# Patient Record
Sex: Female | Born: 1965 | Race: Black or African American | Hispanic: No | Marital: Married | Smoking: Former smoker
Health system: Southern US, Community
[De-identification: ages and names within clinical notes are randomized; demographics above are authoritative.]

## PROBLEM LIST (undated history)

## (undated) ENCOUNTER — Ambulatory Visit (HOSPITAL_COMMUNITY): Admission: EM | Payer: Medicaid Other

## (undated) DIAGNOSIS — R4020 Unspecified coma: Secondary | ICD-10-CM

## (undated) DIAGNOSIS — I509 Heart failure, unspecified: Secondary | ICD-10-CM

## (undated) DIAGNOSIS — K449 Diaphragmatic hernia without obstruction or gangrene: Secondary | ICD-10-CM

## (undated) DIAGNOSIS — K219 Gastro-esophageal reflux disease without esophagitis: Secondary | ICD-10-CM

## (undated) DIAGNOSIS — H269 Unspecified cataract: Secondary | ICD-10-CM

## (undated) DIAGNOSIS — F329 Major depressive disorder, single episode, unspecified: Secondary | ICD-10-CM

## (undated) DIAGNOSIS — I639 Cerebral infarction, unspecified: Secondary | ICD-10-CM

## (undated) DIAGNOSIS — M1990 Unspecified osteoarthritis, unspecified site: Secondary | ICD-10-CM

## (undated) DIAGNOSIS — F32A Depression, unspecified: Secondary | ICD-10-CM

## (undated) DIAGNOSIS — J45909 Unspecified asthma, uncomplicated: Secondary | ICD-10-CM

## (undated) DIAGNOSIS — IMO0001 Reserved for inherently not codable concepts without codable children: Principal | ICD-10-CM

## (undated) DIAGNOSIS — I10 Essential (primary) hypertension: Secondary | ICD-10-CM

## (undated) DIAGNOSIS — K8020 Calculus of gallbladder without cholecystitis without obstruction: Secondary | ICD-10-CM

## (undated) DIAGNOSIS — G4700 Insomnia, unspecified: Secondary | ICD-10-CM

## (undated) DIAGNOSIS — M797 Fibromyalgia: Secondary | ICD-10-CM

## (undated) DIAGNOSIS — E11319 Type 2 diabetes mellitus with unspecified diabetic retinopathy without macular edema: Secondary | ICD-10-CM

## (undated) HISTORY — DX: Depression, unspecified: F32.A

## (undated) HISTORY — DX: Type 2 diabetes mellitus with unspecified diabetic retinopathy without macular edema: E11.319

## (undated) HISTORY — DX: Unspecified asthma, uncomplicated: J45.909

## (undated) HISTORY — PX: TUBAL LIGATION: SHX77

## (undated) HISTORY — DX: Unspecified cataract: H26.9

## (undated) HISTORY — DX: Reserved for inherently not codable concepts without codable children: IMO0001

## (undated) HISTORY — PX: INNER EAR SURGERY: SHX679

## (undated) HISTORY — DX: Insomnia, unspecified: G47.00

## (undated) HISTORY — DX: Major depressive disorder, single episode, unspecified: F32.9

## (undated) HISTORY — DX: Gastro-esophageal reflux disease without esophagitis: K21.9

---

## 2005-04-20 ENCOUNTER — Emergency Department (HOSPITAL_COMMUNITY): Admission: EM | Admit: 2005-04-20 | Discharge: 2005-04-20 | Payer: Self-pay | Admitting: Family Medicine

## 2005-09-08 DIAGNOSIS — I639 Cerebral infarction, unspecified: Secondary | ICD-10-CM

## 2005-09-08 HISTORY — DX: Cerebral infarction, unspecified: I63.9

## 2008-12-25 ENCOUNTER — Ambulatory Visit: Payer: Self-pay | Admitting: Radiology

## 2008-12-25 ENCOUNTER — Ambulatory Visit (HOSPITAL_BASED_OUTPATIENT_CLINIC_OR_DEPARTMENT_OTHER): Admission: RE | Admit: 2008-12-25 | Discharge: 2008-12-25 | Payer: Self-pay | Admitting: Family Medicine

## 2011-01-14 ENCOUNTER — Inpatient Hospital Stay (INDEPENDENT_AMBULATORY_CARE_PROVIDER_SITE_OTHER)
Admission: RE | Admit: 2011-01-14 | Discharge: 2011-01-14 | Disposition: A | Discharge status: Home / Self Care | Payer: Self-pay | Source: Ambulatory Visit | Attending: Family Medicine | Admitting: Family Medicine

## 2011-01-14 ENCOUNTER — Ambulatory Visit (INDEPENDENT_AMBULATORY_CARE_PROVIDER_SITE_OTHER): Payer: Self-pay

## 2011-01-14 DIAGNOSIS — S8000XA Contusion of unspecified knee, initial encounter: Secondary | ICD-10-CM

## 2011-01-14 DIAGNOSIS — S60219A Contusion of unspecified wrist, initial encounter: Secondary | ICD-10-CM

## 2011-06-02 ENCOUNTER — Inpatient Hospital Stay (INDEPENDENT_AMBULATORY_CARE_PROVIDER_SITE_OTHER)
Admission: RE | Admit: 2011-06-02 | Discharge: 2011-06-02 | Disposition: A | Discharge status: Home / Self Care | Payer: Self-pay | Source: Ambulatory Visit | Attending: Family Medicine | Admitting: Family Medicine

## 2011-06-02 DIAGNOSIS — J45909 Unspecified asthma, uncomplicated: Secondary | ICD-10-CM

## 2011-06-02 DIAGNOSIS — G609 Hereditary and idiopathic neuropathy, unspecified: Secondary | ICD-10-CM

## 2011-06-02 DIAGNOSIS — E119 Type 2 diabetes mellitus without complications: Secondary | ICD-10-CM

## 2011-06-02 LAB — TSH: TSH: 0.942 u[IU]/mL (ref 0.350–4.500)

## 2011-06-02 LAB — POCT I-STAT, CHEM 8
BUN: 5 mg/dL — ABNORMAL LOW (ref 6–23)
Chloride: 102 mEq/L (ref 96–112)
HCT: 43 % (ref 36.0–46.0)
TCO2: 25 mmol/L (ref 0–100)

## 2011-09-29 ENCOUNTER — Emergency Department (HOSPITAL_COMMUNITY)
Admission: EM | Admit: 2011-09-29 | Discharge: 2011-09-29 | Disposition: A | Discharge status: Home / Self Care | Payer: Medicaid Other | Attending: Emergency Medicine | Admitting: Emergency Medicine

## 2011-09-29 ENCOUNTER — Encounter (HOSPITAL_COMMUNITY): Payer: Self-pay

## 2011-09-29 DIAGNOSIS — Z8679 Personal history of other diseases of the circulatory system: Secondary | ICD-10-CM | POA: Insufficient documentation

## 2011-09-29 DIAGNOSIS — R05 Cough: Secondary | ICD-10-CM | POA: Insufficient documentation

## 2011-09-29 DIAGNOSIS — J069 Acute upper respiratory infection, unspecified: Secondary | ICD-10-CM | POA: Insufficient documentation

## 2011-09-29 DIAGNOSIS — E1169 Type 2 diabetes mellitus with other specified complication: Secondary | ICD-10-CM | POA: Insufficient documentation

## 2011-09-29 DIAGNOSIS — J3489 Other specified disorders of nose and nasal sinuses: Secondary | ICD-10-CM | POA: Insufficient documentation

## 2011-09-29 DIAGNOSIS — R059 Cough, unspecified: Secondary | ICD-10-CM | POA: Insufficient documentation

## 2011-09-29 DIAGNOSIS — J029 Acute pharyngitis, unspecified: Secondary | ICD-10-CM | POA: Insufficient documentation

## 2011-09-29 DIAGNOSIS — R3589 Other polyuria: Secondary | ICD-10-CM | POA: Insufficient documentation

## 2011-09-29 DIAGNOSIS — R358 Other polyuria: Secondary | ICD-10-CM | POA: Insufficient documentation

## 2011-09-29 DIAGNOSIS — R739 Hyperglycemia, unspecified: Secondary | ICD-10-CM

## 2011-09-29 HISTORY — DX: Cerebral infarction, unspecified: I63.9

## 2011-09-29 HISTORY — DX: Unspecified coma: R40.20

## 2011-09-29 HISTORY — DX: Calculus of gallbladder without cholecystitis without obstruction: K80.20

## 2011-09-29 HISTORY — DX: Unspecified osteoarthritis, unspecified site: M19.90

## 2011-09-29 HISTORY — DX: Diaphragmatic hernia without obstruction or gangrene: K44.9

## 2011-09-29 LAB — GLUCOSE, CAPILLARY: Glucose-Capillary: 299 mg/dL — ABNORMAL HIGH (ref 70–99)

## 2011-09-29 MED ORDER — METFORMIN HCL 500 MG PO TABS: 500.0000 mg | ORAL_TABLET | Freq: Every day | ORAL | Status: DC

## 2011-09-29 MED ORDER — METFORMIN HCL 500 MG PO TABS
500.0000 mg | ORAL_TABLET | Freq: Once | ORAL | Status: DC
  Filled 2011-09-29: qty 1

## 2011-09-29 MED ORDER — ALBUTEROL SULFATE HFA 108 (90 BASE) MCG/ACT IN AERS
2.0000 | INHALATION_SPRAY | Freq: Four times a day (QID) | RESPIRATORY_TRACT | Status: DC
  Administered 2011-09-29: 2 via RESPIRATORY_TRACT
  Filled 2011-09-29: qty 6.7

## 2011-09-29 NOTE — ED Provider Notes (Signed)
History     CSN: [id number]  Arrival date & time 09/29/11  1013   First MD Initiated Contact with Patient 09/29/11 1034      Chief Complaint  Patient presents with  . Diabetes    (Consider location/radiation/quality/duration/timing/severity/associated sxs/prior treatment) HPI Comments: Patient states she is unable to see her primary care provider in [place] because she owes them money, and has therefore been off of her metformin for several weeks.  States she is sometimes thirsty and has polyuria and has recently had a URI that is improving, but otherwise feels fine.  URI symptoms began last week, included nasal congestion, rhinorrhea, sore throat, cough - states these have all largely improved but she continues to have mild cough.  Denies fevers, SOB, wheezing.  Denies abdominal pain, N/V/D.    The history is provided by the patient.    Past Medical History  Diagnosis Date  . Diabetes mellitus   . Arthritis   . Hiatal hernia   . Gall stone   . Stroke   . Coma     History reviewed. No pertinent past surgical history.  History reviewed. No pertinent family history.  History  Substance Use Topics  . Smoking status: Never Smoker   . Smokeless tobacco: Not on file  . Alcohol Use: No    OB History    Grav Para Term Preterm Abortions TAB SAB Ect Mult Living                  Review of Systems  Constitutional: Positive for fatigue. Negative for fever and chills.  HENT: Negative for sore throat, neck stiffness and sinus pressure.   Respiratory: Positive for cough. Negative for chest tightness, shortness of breath and wheezing.   Gastrointestinal: Negative for nausea, vomiting, abdominal pain and diarrhea.  All other systems reviewed and are negative.    Allergies  Review of patient's allergies indicates no known allergies.  Home Medications  No current outpatient prescriptions on file.  BP 132/88  Pulse 98  Temp(Src) 98.6 F (37 C) (Oral)  Resp 20  SpO2  99%  LMP 09/09/2011  Physical Exam  Nursing note and vitals reviewed. Constitutional: She is oriented to person, place, and time. She appears well-developed and well-nourished.  HENT:  Head: Normocephalic and atraumatic.  Nose: No mucosal edema. Right sinus exhibits no maxillary sinus tenderness and no frontal sinus tenderness. Left sinus exhibits no maxillary sinus tenderness and no frontal sinus tenderness.  Mouth/Throat: Uvula is midline, oropharynx is clear and moist and mucous membranes are normal. No oropharyngeal exudate, posterior oropharyngeal edema, posterior oropharyngeal erythema or tonsillar abscesses.  Neck: Neck supple.  Cardiovascular: Normal rate, regular rhythm and normal heart sounds.   Pulmonary/Chest: Breath sounds normal. No stridor. No respiratory distress. She has no wheezes. She has no rales. She exhibits no tenderness.  Abdominal: Soft. Bowel sounds are normal. She exhibits no distension and no mass. There is no tenderness. There is no rebound and no guarding.  Lymphadenopathy:    She has no cervical adenopathy.  Neurological: She is alert and oriented to person, place, and time.  Psychiatric: She has a normal mood and affect. Her behavior is normal. Thought content normal.    ED Course  Procedures (including critical care time)  Labs Reviewed  GLUCOSE, CAPILLARY - Abnormal; Notable for the following:    Glucose-Capillary 299 (*)    All other components within normal limits   No results found.  Filed Vitals:   09/29/11 1026  BP: 132/88  Pulse: 98  Temp: 98.6 F (37 C)  Resp: 20      1. Diabetes mellitus   2. Hyperglycemia   3. URI (upper respiratory infection)       MDM  Well-appearing patient who has been out of her diabetes medications for weeks and is here for refills.  She also has a URI which she states is improving - no fever, no SOB, lungs CTAB.  Patient without complaints, here for refills, patient has hyperglycemia but clinically no  concern for DKA.  Discharged patient home after giving home dose of metformin and providing inhaler.  Patient to follow up with PCP after she pays them $39 she owes or return for worsening symptoms.    Medical screening examination/treatment/procedure(s) were performed by non-physician practitioner and as supervising physician I was immediately available for consultation/collaboration. Osvaldo Human, M.D.        Dillard Cannon [place], [place] 09/29/11 1558  Carleene Cooper III, MD 09/30/11 [phone]

## 2011-09-29 NOTE — ED Notes (Signed)
sts out of diabetic medication so needs RX for that since she cant pay her doctor, and also needs something for flu symptoms, pt is out of medication for three weeks.

## 2011-09-29 NOTE — ED Notes (Signed)
Discharge instructions reviewed; verbalizes understanding.  No questions asked; no further c/o's voiced.  Ambulatory to lobby.  NAD noted. 

## 2011-11-21 ENCOUNTER — Telehealth: Payer: Self-pay

## 2011-11-21 NOTE — Telephone Encounter (Signed)
Pharmacy calling to see what the status is of her medication that they faxed to [place]

## 2011-11-22 NOTE — Telephone Encounter (Signed)
[facility] notifying patient that we have not received fax.  Advised she CB with medication needed.

## 2011-11-23 NOTE — Telephone Encounter (Signed)
Spoke with patient, was seen for W/C injury recently and is requesting refill on her Flexeril and Ibuprofen 800mg .  Also, she states that since she has completed her course of Prednisone (1 1/2 wks ago), she has been having trouble holding her urine for very long.  Feels like she has to run to the bathroom too frequently.  Will this go away with time, since it started while on Pred?  Pls advise.  Will route msg to med records to pull chart, then to PA pool pls.

## 2011-11-24 NOTE — Telephone Encounter (Signed)
We are having to do a prior authorization on her medications.  We will be working on that tomorrow.  Typically prednisone does not cause increased urination - but if she has increased fluid intake that could cause her problems.  She might have a UTI and she should be evaluated for that.  The chart will be at [facility] desk for prior authorization.

## 2011-11-24 NOTE — Telephone Encounter (Signed)
Chart pulled to PA. Please return chart to worker's comp to finish referral.

## 2011-11-25 NOTE — Telephone Encounter (Signed)
Called number listed in chart and number for [employer]. Not an employee there

## 2011-11-25 NOTE — Telephone Encounter (Signed)
Called number listed in chart for patients son and got updated number for patient. [phone]. Spoke with patient and she has been going to urinate more frequently no pain with urination, and able to completely void. She did increase her fluid intake (H2O) while on prednisone. Stated that she also felt like retaining fluid. Explained to patient prednisone can make you retain fluid and might be voiding more frequently due to excess fluid. Patient notified if sxs still persist or get worse RTC.

## 2011-12-16 ENCOUNTER — Ambulatory Visit: Payer: Medicaid Other | Attending: Orthopedic Surgery | Admitting: Physical Therapy

## 2011-12-16 DIAGNOSIS — M545 Low back pain, unspecified: Secondary | ICD-10-CM | POA: Insufficient documentation

## 2011-12-16 DIAGNOSIS — IMO0001 Reserved for inherently not codable concepts without codable children: Secondary | ICD-10-CM | POA: Insufficient documentation

## 2011-12-16 DIAGNOSIS — M2560 Stiffness of unspecified joint, not elsewhere classified: Secondary | ICD-10-CM | POA: Insufficient documentation

## 2011-12-16 DIAGNOSIS — M25559 Pain in unspecified hip: Secondary | ICD-10-CM | POA: Insufficient documentation

## 2011-12-24 ENCOUNTER — Ambulatory Visit: Payer: Medicaid Other | Admitting: Physical Therapy

## 2011-12-25 ENCOUNTER — Ambulatory Visit: Payer: Medicaid Other | Admitting: Physical Therapy

## 2011-12-29 ENCOUNTER — Encounter: Payer: Medicaid Other | Admitting: Physical Therapy

## 2012-01-01 ENCOUNTER — Ambulatory Visit: Payer: Medicaid Other | Admitting: Physical Therapy

## 2012-02-22 ENCOUNTER — Emergency Department (HOSPITAL_COMMUNITY): Payer: Medicaid Other

## 2012-02-22 ENCOUNTER — Encounter (HOSPITAL_COMMUNITY): Payer: Self-pay | Admitting: *Deleted

## 2012-02-22 ENCOUNTER — Emergency Department (HOSPITAL_COMMUNITY)
Admission: EM | Admit: 2012-02-22 | Discharge: 2012-02-22 | Disposition: A | Discharge status: Home / Self Care | Payer: Medicaid Other | Attending: Emergency Medicine | Admitting: Emergency Medicine

## 2012-02-22 DIAGNOSIS — W19XXXA Unspecified fall, initial encounter: Secondary | ICD-10-CM

## 2012-02-22 DIAGNOSIS — S93409A Sprain of unspecified ligament of unspecified ankle, initial encounter: Secondary | ICD-10-CM | POA: Insufficient documentation

## 2012-02-22 DIAGNOSIS — M129 Arthropathy, unspecified: Secondary | ICD-10-CM | POA: Insufficient documentation

## 2012-02-22 DIAGNOSIS — IMO0002 Reserved for concepts with insufficient information to code with codable children: Secondary | ICD-10-CM | POA: Insufficient documentation

## 2012-02-22 DIAGNOSIS — S8390XA Sprain of unspecified site of unspecified knee, initial encounter: Secondary | ICD-10-CM

## 2012-02-22 DIAGNOSIS — T148XXA Other injury of unspecified body region, initial encounter: Secondary | ICD-10-CM

## 2012-02-22 DIAGNOSIS — W010XXA Fall on same level from slipping, tripping and stumbling without subsequent striking against object, initial encounter: Secondary | ICD-10-CM | POA: Insufficient documentation

## 2012-02-22 DIAGNOSIS — E119 Type 2 diabetes mellitus without complications: Secondary | ICD-10-CM | POA: Insufficient documentation

## 2012-02-22 DIAGNOSIS — Z8673 Personal history of transient ischemic attack (TIA), and cerebral infarction without residual deficits: Secondary | ICD-10-CM | POA: Insufficient documentation

## 2012-02-22 DIAGNOSIS — Y9229 Other specified public building as the place of occurrence of the external cause: Secondary | ICD-10-CM | POA: Insufficient documentation

## 2012-02-22 MED ORDER — CYCLOBENZAPRINE HCL 10 MG PO TABS: 10.0000 mg | ORAL_TABLET | Freq: Two times a day (BID) | ORAL | Status: AC | PRN

## 2012-02-22 MED ORDER — OXYCODONE-ACETAMINOPHEN 5-325 MG PO TABS
1.0000 | ORAL_TABLET | Freq: Once | ORAL | Status: AC
  Administered 2012-02-22: 1 via ORAL
  Filled 2012-02-22: qty 1

## 2012-02-22 MED ORDER — OXYCODONE-ACETAMINOPHEN 5-325 MG PO TABS: 1.0000 | ORAL_TABLET | Freq: Four times a day (QID) | ORAL | Status: AC | PRN

## 2012-02-22 NOTE — ED Notes (Signed)
Pt reports slipping in water at the grocery store yesterday, states right leg went behind her and left knee hit ground. Pt reports left knee pain and back pain.

## 2012-02-22 NOTE — ED Notes (Signed)
Pt states that she slipped on wet surface at [facility] yesterday kinda doing a split  She hit her left knee  Felt it in her hipsand she has had shooting pain in neck back

## 2012-02-22 NOTE — Discharge Instructions (Signed)
Bone Bruise  A bone bruise is a small hidden fracture of the bone. It typically occurs with bones located close to the surface of the skin.  SYMPTOMS  The pain lasts longer than a normal bruise.   The bruised area is difficult to use.   There may be discoloration or swelling of the bruised area.   When a bone bruise is found with injury to the anterior cruciate ligament (in the knee) there is often an increased:   Amount of fluid in the knee   Time the fluid in the knee lasts.   Number of days until you are walking normally and regaining the motion you had before the injury.   Number of days with pain from the injury.  DIAGNOSIS  It can only be seen on X-rays known as MRIs. This stands for magnetic resonance imaging. A regular X-ray taken of a bone bruise would appear to be normal. A bone bruise is a common injury in the knee and the heel bone (calcaneus). The problems are similar to those produced by stress fractures, which are bone injuries caused by overuse. A bone bruise may also be a sign of other injuries. For example, bone bruises are commonly found where an anterior cruciate ligament (ACL) in the knee has been pulled away from the bone (ruptured). A ligament is a tough fibrous material that connects bones together to make our joints stable. Bruises of the bone last a lot longer than bruises of the muscle or tissues beneath the skin. Bone bruises can last from days to months and are often more severe and painful than other bruises. TREATMENT Because bone bruises are sudden injuries you cannot often prevent them, other than by being extremely careful. Some things you can do to improve the condition are:  Apply ice to the sore area for 15 to 20 minutes, 3 to 4 times per day while awake for the first 2 days. Put the ice in a plastic bag, and place a towel between the bag of ice and your skin.   Keep your bruised area raised (elevated) when possible to lessen swelling.   For activity:     Use crutches when necessary; do not put weight on the injured leg until you are no longer tender.   You may walk on your affected part as the pain allows, or as instructed.   Start weight bearing gradually on the bruised part.   Continue to use crutches or a cane until you can stand without causing pain, or as instructed.   If a plaster splint was applied, wear the splint until you are seen for a follow-up examination. Rest it on nothing harder than a pillow the first 24 hours. Do not put weight on it. Do not get it wet. You may take it off to take a shower or bath.   If an air splint was applied, more air may be blown into or out of the splint as needed for comfort. You may take it off at night and to take a shower or bath.   Wiggle your toes in the splint several times per day if you are able.   You may have been given an elastic bandage to use with the plaster splint or alone. The splint is too tight if you have numbness, tingling or if your foot becomes cold and blue. Adjust the bandage to make it comfortable.   Only take over-the-counter or prescription medicines for pain, discomfort, or fever as directed by   discomfort, or fever as directed by your caregiver.   Follow all instructions for follow up with your caregiver. This includes any orthopedic referrals, physical therapy, and rehabilitation. Any delay in obtaining necessary care could result in a delay or failure of the bones to heal.  SEEK MEDICAL CARE IF:    You have an increase in bruising, swelling, or pain.   You notice coldness of your toes.   You do not get pain relief with medications.  SEEK IMMEDIATE MEDICAL CARE IF:    Your toes are numb or blue.   You have severe pain not controlled with medications.   If any of the problems that caused you to seek care are becoming worse.  Document Released: 11/15/2003 Document Revised: 08/14/2011 Document Reviewed: 03/29/2008  [employer] Patient Information 2012 [employer].

## 2012-02-22 NOTE — ED Provider Notes (Addendum)
History  This chart was scribed for Cassie Sprout, MD by Cherlynn Perches. The patient was seen in room STRE2/STRE2. Patient's care was started at 1139.   CSN: [id number]  Arrival date & time 02/22/12  1139   First MD Initiated Contact with Patient 02/22/12 1209      Chief Complaint  Patient presents with  . Fall  . Knee Pain  . Back Pain    (Consider location/radiation/quality/duration/timing/severity/associated sxs/prior treatment) HPI  Cassie Mata is a 46 y.o. female who presents to the Emergency Department complaining of 1 day of sudden onset, gradually worsening, constant leg pain localized to the right knee with associated diffuse back pain, right ankle pain, and right knee swelling related to a fall. Pt states that she fell after slipping on water yesterday. Pt reports that he right leg went behind her and her left knee hit the ground. Pt denies head injury and LOC.    Past Medical History  Diagnosis Date  . Diabetes mellitus   . Arthritis   . Hiatal hernia   . Gall stone   . Stroke   . Coma     History reviewed. No pertinent past surgical history.  History reviewed. No pertinent family history.  History  Substance Use Topics  . Smoking status: Never Smoker   . Smokeless tobacco: Not on file  . Alcohol Use: No    OB History    Grav Para Term Preterm Abortions TAB SAB Ect Mult Living                  Review of Systems  Constitutional: Negative for fever and chills.  HENT: Negative for ear pain and congestion.   Respiratory: Negative for shortness of breath.   Cardiovascular: Negative for chest pain.  Gastrointestinal: Negative for nausea and vomiting.  Genitourinary: Negative for dysuria and hematuria.  Musculoskeletal: Positive for myalgias, back pain, joint swelling and arthralgias.  Skin: Negative for rash and wound.  Neurological: Negative for syncope, weakness and light-headedness.  All other systems reviewed and are  negative.    Allergies  Ivp dye; Other; Prednisone; and Ultracet  Home Medications   Current Outpatient Rx  Name Route Sig Dispense Refill  . ALBUTEROL SULFATE HFA 108 (90 BASE) MCG/ACT IN AERS Inhalation Inhale 2 puffs into the lungs every 6 (six) hours as needed. For shortness of breath    . BUPROPION HCL ER (XL) 150 MG PO TB24 Oral Take 150 mg by mouth daily.    Marland Kitchen METFORMIN HCL 500 MG PO TABS Oral Take 500 mg by mouth daily with breakfast.    . PRENATAL MULTIVITAMIN CH Oral Take 1 tablet by mouth daily.    . TRAMADOL HCL 50 MG PO TABS Oral Take 50 mg by mouth every 6 (six) hours as needed. For pain.      BP 132/86  Pulse 89  Temp 98.6 F (37 C)  Resp 20  SpO2 99%  Physical Exam  Nursing note and vitals reviewed. Constitutional: She is oriented to person, place, and time. She appears well-developed and well-nourished. No distress.  HENT:  Head: Normocephalic and atraumatic.  Eyes: EOM are normal.  Neck: Neck supple. No tracheal deviation present.  Cardiovascular: Normal rate, regular rhythm and normal heart sounds.   Pulmonary/Chest: Effort normal and breath sounds normal. No respiratory distress.  Abdominal: Soft. There is no tenderness.  Musculoskeletal: Normal range of motion. She exhibits tenderness (bony thoracic tenderness, bilateral trapezius tenderness, bony l-spine tenderness).  Left knee: She exhibits swelling. She exhibits no deformity, no erythema, no LCL laxity and no MCL laxity. tenderness found. Medial joint line and lateral joint line tenderness noted. No MCL, no LCL and no patellar tendon tenderness noted.  Neurological: She is alert and oriented to person, place, and time.  Skin: Skin is warm and dry.  Psychiatric: She has a normal mood and affect. Her behavior is normal.    ED Course  Procedures (including critical care time)  DIAGNOSTIC STUDIES: Oxygen Saturation is 99% on room air, normal by my interpretation.    COORDINATION OF  CARE: 12:16PM - Patient informed of clinical course, understand medical decision-making process, and agree with plan.     Labs Reviewed - No data to display Dg Thoracic Spine W/swimmers  02/22/2012  *RADIOLOGY REPORT*  Clinical Data: Fall.  Back pain.  THORACIC SPINE - 2 VIEW + SWIMMERS  Comparison: None.  Findings: Anatomic alignment.  No fracture.  Craniocervical junction normal.  Intervertebral disc spaces are normal.  The paraspinal lines normal.  IMPRESSION: Negative.  Original Report Authenticated By: Andreas Newport, M.D.   Dg Lumbar Spine Complete  02/22/2012  *RADIOLOGY REPORT*  Clinical Data: Fall.  Back pain.  LUMBAR SPINE - COMPLETE 4+ VIEW  Comparison: None.  Findings: There is no fracture identified.  Mild levoconvex curvature.  Calcified phleboliths in the anatomic pelvis.  No pars defects.  Vertebral body height is preserved.  Trace retrolisthesis of L5 on S1, grade 1 appears degenerative.  Intervertebral disc spaces appear within normal limits.  IMPRESSION:  1.  No fracture or acute osseous abnormality. 2.  Mild levoconvex curvature may be positional or scoliosis. 3.  Grade 1 retrolisthesis of L5 on S1 is probably degenerative.  Original Report Authenticated By: Andreas Newport, M.D.   Dg Ankle Complete Left  02/22/2012  *RADIOLOGY REPORT*  Clinical Data: Post fall, now with left knee and ankle pain  LEFT ANKLE COMPLETE - 3+ VIEW  Comparison: None.  Findings: No fracture or dislocation.  The regional soft tissues are normal.  No joint effusion.  There is minimal osteophytosis about the tip of the medial malleolus, favored to be the sequela of remote injury.  Small plantar calcaneal spur.  Minimal enthesopathic change of the insertion site of the Achilles tendon. An accessory ossicle is noted about the lateral aspect of the midfoot.  IMPRESSION: No fracture.  Original Report Authenticated By: Waynard Reeds, M.D.   Dg Knee Complete 4 Views Left  02/22/2012  *RADIOLOGY REPORT*   Clinical Data: Post fall, now with left knee pain  LEFT KNEE - COMPLETE 4+ VIEW  Comparison: 01/14/2011  Findings: No fracture or dislocation.  Joint spaces are preserved. No suprapatellar joint effusion.  Unchanged benign bony excrescence arising from the medial femoral condyle, possibly the sequela of remote MCL avulsion injury (pellegrini-stieda).  No evidence of chondrocalcinosis.  Regional soft tissues are normal.  IMPRESSION: No acute findings.  Original Report Authenticated By: Waynard Reeds, M.D.     1. Fall   2. Contusion   3. Knee sprain   4. Ankle sprain       MDM   Patient with a mechanical fall her shoe store yesterday causing her to hit her knee on the floor. Since her fall she has had worsening left-sided knee pain as well in his lumbar and thoracic back pain and trapezial pain. She states she has the pain was worse today she decided to come in and be evaluated. She  is neurovascularly intact with normal pulses in all extremities. Plain films of the left knee, T. and L. spines are all within normal limits without signs of acute injury. Patient has diffuse muscles strain from her fall and was given supportive measures      I personally performed the services described in this documentation, which was scribed in my presence.  The recorded information has been reviewed and considered.    Cassie Sprout, MD 02/22/12 1238  Cassie Sprout, MD 02/22/12 1445  Cassie Sprout, MD 02/22/12 1454

## 2012-12-31 ENCOUNTER — Ambulatory Visit: Payer: Medicaid Other

## 2013-01-13 ENCOUNTER — Ambulatory Visit (INDEPENDENT_AMBULATORY_CARE_PROVIDER_SITE_OTHER): Payer: Medicaid Other | Admitting: Neurology

## 2013-01-13 ENCOUNTER — Encounter: Payer: Self-pay | Admitting: Neurology

## 2013-01-13 VITALS — BP 128/84 | HR 111 | Ht <= 58 in | Wt 171.0 lb

## 2013-01-13 DIAGNOSIS — IMO0001 Reserved for inherently not codable concepts without codable children: Secondary | ICD-10-CM

## 2013-01-13 HISTORY — DX: Reserved for inherently not codable concepts without codable children: IMO0001

## 2013-01-13 MED ORDER — GABAPENTIN 300 MG PO CAPS: 300.0000 mg | ORAL_CAPSULE | Freq: Two times a day (BID) | ORAL | Status: DC

## 2013-01-13 NOTE — Patient Instructions (Addendum)

## 2013-01-13 NOTE — Progress Notes (Signed)
Reason for visit: Muscle pain  Cassie Mata is a 47 y.o. female  History of present illness:  Cassie Mata is a 47 year old right-handed black female with a history of chronic low back pain. The patient has been seen by orthopedic surgery several years ago, and she was given a 3% disability rating for her back pain. More recently, the patient has had some issues with pain down the left leg, without pain in the right leg. The patient also has chronic discomfort in the cervical spine and shoulders, and she has been seen by a chiropractor, and x-rays have shown straightening of the cervical spine. The patient also indicates that she has had a prior stroke, and she was seen at [facility], and the medical records are not available to me. The patient reports some memory issues following the stroke that occurred several years ago. The patient denies any numbness in the extremities with exception that she occasionally will have some numbness in the hands. The patient has a history of diabetes, but she denies any burning or stinging sensations in the feet or legs. The patient indicates that she feels "weak all over". The patient reports some gait instability, and occasional falls. The patient denies problems controlling the bowels or the bladder. The patient is sent to this office for an evaluation of possible fibromyalgia. In the past, the patient has been on Lyrica for her low back pain, but she currently is not on this medication. The patient does not recall whether or not this medication helped her.  Past Medical History  Diagnosis Date  . Diabetes mellitus   . Arthritis   . Hiatal hernia   . Gall stone   . Stroke   . Coma   . Asthma   . Myalgia and myositis, unspecified 01/13/2013  . Gastroesophageal reflux disease     Past Surgical History  Procedure Laterality Date  . Cesarean section  [phone]    Family History  Problem Relation Age of Onset  . Mental  illness Sister   . Diabetes Sister   . Mental illness Son     suicidal thoughts  . Diabetes Father   . Cancer Father     Social history:  reports that she quit smoking about 17 years ago. She does not have any smokeless tobacco history on file. She reports that she does not drink alcohol or use illicit drugs.  Medications:  Current Outpatient Prescriptions on File Prior to Visit  Medication Sig Dispense Refill  . albuterol (PROVENTIL HFA;VENTOLIN HFA) 108 (90 BASE) MCG/ACT inhaler Inhale 2 puffs into the lungs every 6 (six) hours as needed. For shortness of breath      . buPROPion (WELLBUTRIN XL) 150 MG 24 hr tablet Take 150 mg by mouth daily.      . metFORMIN (GLUCOPHAGE) 500 MG tablet Take 500 mg by mouth daily with breakfast.      . traMADol (ULTRAM) 50 MG tablet Take 50 mg by mouth every 6 (six) hours as needed. For pain.       No current facility-administered medications on file prior to visit.    Allergies:  Allergies  Allergen Reactions  . Ivp Dye (Iodinated Diagnostic Agents) Nausea Only  . Other     "allergy medications"= makes heart flutter  . Prednisone Nausea Only    Elevates blood sugar  . Ultracet (Tramadol-Acetaminophen) Nausea And Vomiting    ROS:  Out of a complete 14 system review of symptoms, the patient  complains only of the following symptoms, and all other reviewed systems are negative.  Weight loss Chest pains, palpitations Ringing in the ears Blurred vision Shortness of breath, cough, wheezing Constipation Anemia, easy bruising Achy muscles, back pain Allergies Memory loss, headache, numbness, weakness, dizziness Depression, anxiety, insomnia, change in appetite  Blood pressure 128/84, pulse 111, height 4' 4.5" (1.334 m), weight 171 lb (77.565 kg).  Physical Exam  General: The patient is alert and cooperative at the time of the examination. The patient is minimally obese.  Head: Pupils are equal, round, and reactive to light. Discs are  flat bilaterally.  Neck: The neck is supple, no carotid bruits are noted.  Respiratory: The respiratory examination is clear.  Cardiovascular: The cardiovascular examination reveals a regular rate and rhythm, no obvious murmurs or rubs are noted.  Neuromuscular: The patient is able to flex the low back to about 90. The patient has full range of movement of the cervical spine.  Skin: Extremities are without significant edema.  Neurologic Exam  Mental status:  Cranial nerves: Facial symmetry is present. There is good sensation of the face to pinprick and soft touch bilaterally. The strength of the facial muscles and the muscles to head turning and shoulder shrug are normal bilaterally. Speech is well enunciated, no aphasia or dysarthria is noted. Extraocular movements are full. Visual fields are full.  Motor: The motor testing reveals 5 over 5 strength of all 4 extremities. The patient is able to walk on heels and the toes. Good symmetric motor tone is noted throughout.  Sensory: Sensory testing is intact to pinprick, soft touch, vibration sensation, and position sense on all 4 extremities. No evidence of extinction is noted.  Coordination: Cerebellar testing reveals good finger-nose-finger and heel-to-shin bilaterally.  Gait and station: Gait is normal. Tandem gait is normal. Romberg is negative. No drift is seen.  Reflexes: Deep tendon reflexes are symmetric, but are depressed bilaterally. Toes are downgoing bilaterally.   Assessment/Plan:  1. Chronic low back pain  2. Neck and shoulder discomfort, possible fibromyalgia  3. Diabetes  The patient will be sent for further blood work today looking for connective tissue disorders. The patient will be placed on gabapentin for the pain. The patient will followup in 4 months. The patient will contact our office if she is unable to tolerate the medication.  Marlan Palau MD 01/13/2013 7:48 PM  [facility] [address] [place], [place] [address]  Phone [phone] Fax [phone]

## 2013-01-17 ENCOUNTER — Other Ambulatory Visit: Payer: Self-pay | Admitting: Neurology

## 2013-01-18 LAB — VITAMIN B12: Vitamin B-12: 371 pg/mL (ref 211–946)

## 2013-01-18 LAB — RHEUMATOID FACTOR: Rhuematoid fact SerPl-aCnc: 11.5 IU/mL (ref 0.0–13.9)

## 2013-01-18 LAB — ANGIOTENSIN CONVERTING ENZYME: Angio Convert Enzyme: 62 U/L (ref 14–82)

## 2013-01-26 ENCOUNTER — Telehealth: Payer: Self-pay | Admitting: Neurology

## 2013-01-27 NOTE — Telephone Encounter (Signed)
I called and spoke with patient.  She said although she is supposed to take 2 Gabapentin daily, she only takes one HS.  Says she has trouble waking up in the morning when she takes the meds.  States she still has pain and aches all day.  She said it doesn't take the pain away as well as something like Vicodin does.  Then said she does not take Vicodin that often, and when she does, she tries to break it in half because she knows how it is known to cause addiction.  She would like something else prescribed for pain other than Neurontin.  Please Advise.  Thank you.

## 2013-01-28 MED ORDER — GABAPENTIN 100 MG PO CAPS: 100.0000 mg | ORAL_CAPSULE | Freq: Three times a day (TID) | ORAL | Status: DC

## 2013-01-28 NOTE — Telephone Encounter (Signed)
I called patient. The patient is having some drowsiness on the gabapentin at night, yet she cannot sleep. I will reduce the dose to 100 mg capsules 3 times daily. The patient will contact me if she is still having tolerance problems. The patient did not tolerate Lyrica in the past because of drowsiness.

## 2013-05-17 ENCOUNTER — Encounter: Payer: Self-pay | Admitting: Neurology

## 2013-05-17 ENCOUNTER — Ambulatory Visit (INDEPENDENT_AMBULATORY_CARE_PROVIDER_SITE_OTHER): Payer: Medicaid Other | Admitting: Neurology

## 2013-05-17 DIAGNOSIS — IMO0001 Reserved for inherently not codable concepts without codable children: Secondary | ICD-10-CM

## 2013-05-17 MED ORDER — GABAPENTIN 100 MG PO CAPS: ORAL_CAPSULE | ORAL | Status: DC

## 2013-05-17 NOTE — Progress Notes (Signed)
Reason for visit: Fibromyalgia  Cassie Mata is an 47 y.o. female  History of present illness:  Cassie Mata is a 47 year old right-handed black female with a history of fibromyalgia. The patient primarily notes pain on the left side the body with arm and leg. The patient has been placed on gabapentin in low dose, but she could not tolerate the 300 mg capsules. The patient could not tolerate Lyrica secondary to drowsiness previously. The patient is now on the 100 mg capsules of gabapentin taking 1 capsule 3 times daily. The patient has gained some benefit with the medication, but she still has pain. The patient has difficulty sleeping at night, and she indicates that the pain will wake her up when she rolls to the left side. The patient indicates that she has undergone EMG and nerve conduction study evaluations previously and she has had MRI evaluations, but she cannot recall where she had these tests done. The patient returns for an evaluation.  Past Medical History  Diagnosis Date  . Diabetes mellitus   . Arthritis   . Hiatal hernia   . Gall stone   . Stroke   . Coma   . Asthma   . Myalgia and myositis, unspecified 01/13/2013  . Gastroesophageal reflux disease     Past Surgical History  Procedure Laterality Date  . Cesarean section  [phone]    Family History  Problem Relation Age of Onset  . Mental illness Sister   . Diabetes Sister   . Mental illness Son     suicidal thoughts  . Diabetes Father   . Cancer Father     Social history:  reports that she quit smoking about 17 years ago. She does not have any smokeless tobacco history on file. She reports that she does not drink alcohol or use illicit drugs.    Allergies  Allergen Reactions  . Ivp Dye [Iodinated Diagnostic Agents] Nausea Only  . Other     "allergy medications"= makes heart flutter  . Prednisone Nausea Only    Elevates blood sugar  . Ultracet [Tramadol-Acetaminophen] Nausea And Vomiting     Medications:  Current Outpatient Prescriptions on File Prior to Visit  Medication Sig Dispense Refill  . albuterol (PROVENTIL HFA;VENTOLIN HFA) 108 (90 BASE) MCG/ACT inhaler Inhale 2 puffs into the lungs every 6 (six) hours as needed. For shortness of breath      . buPROPion (WELLBUTRIN XL) 150 MG 24 hr tablet Take 150 mg by mouth daily.      Marland Kitchen esomeprazole (NEXIUM) 40 MG capsule Take 40 mg by mouth daily before breakfast.      . Fluticasone-Salmeterol (ADVAIR) 250-50 MCG/DOSE AEPB Inhale 1 puff into the lungs every 12 (twelve) hours.      . metFORMIN (GLUCOPHAGE) 500 MG tablet Take 500 mg by mouth daily with breakfast.      . Multiple Vitamin (MULTIVITAMIN) tablet Take 1 tablet by mouth daily.      . traMADol (ULTRAM) 50 MG tablet Take 50 mg by mouth every 6 (six) hours as needed. For pain.      Marland Kitchen triamcinolone cream (KENALOG) 0.1 % Apply topically 2 (two) times daily as needed.       No current facility-administered medications on file prior to visit.    ROS:  Out of a complete 14 system review of symptoms, the patient complains only of the following symptoms, and all other reviewed systems are negative.  Chest pain, palpitations of the heart Ringing in the ears  Blurred vision Shortness of breath Joint pain, achy muscles Weakness, dizziness Depression, anxiety, insomnia, restless legs  There were no vitals taken for this visit.  Physical Exam  General: The patient is alert and cooperative at the time of the examination.  Skin: No significant peripheral edema is noted.   Neurologic Exam  Cranial nerves: Facial symmetry is present. Speech is normal, no aphasia or dysarthria is noted. Extraocular movements are full. Visual fields are full.  Motor: The patient has good strength in all 4 extremities. The patient has giveaway weakness with the left lower extremity.  Coordination: The patient has good finger-nose-finger and heel-to-shin bilaterally.  Gait and station: The  patient has a normal gait. Tandem gait is normal. Romberg is negative. No drift is seen.  Reflexes: Deep tendon reflexes are symmetric.   Assessment/Plan:  One. Fibromyalgia  The patient continues to have ongoing neuromuscular discomfort. We will increase the gabapentin taking 100 mg twice during the day, and 2 capsules at night. The patient will followup in 4 or 5 months.  Marlan Palau MD 05/17/2013 7:14 PM  [facility] [address] [place], [place] [address]  Phone [phone] Fax [phone]

## 2013-07-11 ENCOUNTER — Emergency Department (HOSPITAL_COMMUNITY)
Admission: EM | Admit: 2013-07-11 | Discharge: 2013-07-11 | Discharge status: Against Medical Advice | Payer: Medicaid Other

## 2013-09-06 ENCOUNTER — Telehealth: Payer: Self-pay | Admitting: Neurology

## 2013-09-06 MED ORDER — PREGABALIN 50 MG PO CAPS: ORAL_CAPSULE | ORAL | Status: DC

## 2013-09-06 NOTE — Telephone Encounter (Signed)
I called the patient. The patient indicates that the gabapentin does not help. I will go back on Lyrica. The patient indicated that the Lyrica help better, but she had drowsiness on the medication and has not. I'll start the 50 mg dose of Lyrica, will go up gradually.

## 2013-09-06 NOTE — Telephone Encounter (Signed)
Patient wants to know if she can have lyrica as she took it years ago and what she is taking now is not working for her.

## 2013-09-30 ENCOUNTER — Encounter (HOSPITAL_COMMUNITY): Payer: Self-pay | Admitting: Emergency Medicine

## 2013-09-30 ENCOUNTER — Emergency Department (HOSPITAL_COMMUNITY)
Admission: EM | Admit: 2013-09-30 | Discharge: 2013-09-30 | Disposition: A | Discharge status: Home / Self Care | Payer: Medicaid Other | Attending: Emergency Medicine | Admitting: Emergency Medicine

## 2013-09-30 ENCOUNTER — Emergency Department (HOSPITAL_COMMUNITY): Payer: Medicaid Other

## 2013-09-30 DIAGNOSIS — Z7982 Long term (current) use of aspirin: Secondary | ICD-10-CM | POA: Insufficient documentation

## 2013-09-30 DIAGNOSIS — M129 Arthropathy, unspecified: Secondary | ICD-10-CM | POA: Insufficient documentation

## 2013-09-30 DIAGNOSIS — Z87891 Personal history of nicotine dependence: Secondary | ICD-10-CM | POA: Insufficient documentation

## 2013-09-30 DIAGNOSIS — E119 Type 2 diabetes mellitus without complications: Secondary | ICD-10-CM | POA: Insufficient documentation

## 2013-09-30 DIAGNOSIS — Z79899 Other long term (current) drug therapy: Secondary | ICD-10-CM | POA: Insufficient documentation

## 2013-09-30 DIAGNOSIS — Z8673 Personal history of transient ischemic attack (TIA), and cerebral infarction without residual deficits: Secondary | ICD-10-CM | POA: Insufficient documentation

## 2013-09-30 DIAGNOSIS — J069 Acute upper respiratory infection, unspecified: Secondary | ICD-10-CM

## 2013-09-30 DIAGNOSIS — J45909 Unspecified asthma, uncomplicated: Secondary | ICD-10-CM | POA: Insufficient documentation

## 2013-09-30 DIAGNOSIS — R000 Tachycardia, unspecified: Secondary | ICD-10-CM | POA: Insufficient documentation

## 2013-09-30 DIAGNOSIS — K219 Gastro-esophageal reflux disease without esophagitis: Secondary | ICD-10-CM | POA: Insufficient documentation

## 2013-09-30 MED ORDER — PREDNISONE 20 MG PO TABS: 40.0000 mg | ORAL_TABLET | Freq: Every day | ORAL | Status: DC

## 2013-09-30 MED ORDER — HYDROCODONE-HOMATROPINE 5-1.5 MG/5ML PO SYRP: 5.0000 mL | ORAL_SOLUTION | Freq: Four times a day (QID) | ORAL | Status: DC | PRN

## 2013-09-30 NOTE — Discharge Instructions (Signed)
Read the instructions below on reasons to return to the emergency department and to learn more about your diagnosis.  Use over the counter medications for symptomatic relief as we discussed (musinex as a decongestant, Tylenol for fever/pain, Motrin/Ibuprofen for muscle aches). If prescribed a cough suppressant during your visit, do not operate heavy machinery with in 5 hours of taking this medication. Followup with your primary care doctor in 4 days if your symptoms persist.  Your more than welcome to return to the emergency department if symptoms worsen or become concerning. ° °Upper Respiratory Infection, Adult  °An upper respiratory infection (URI) is also sometimes known as the common cold. Most people improve within 1 week, but symptoms can last up to 2 weeks. A residual cough may last even longer.  ° °URI is most commonly caused by a virus. Viruses are NOT treated with antibiotics. You can easily spread the virus to others by oral contact. This includes kissing, sharing a glass, coughing, or sneezing. Touching your mouth or nose and then touching a surface, which is then touched by another person, can also spread the virus.  ° °TREATMENT  °Treatment is directed at relieving symptoms. There is no cure. Antibiotics are not effective, because the infection is caused by a virus, not by bacteria. Treatment may include:  °Increased fluid intake. Sports drinks offer valuable electrolytes, sugars, and fluids.  °Breathing heated mist or steam (vaporizer or shower).  °Eating chicken soup or other clear broths, and maintaining good nutrition.  °Getting plenty of rest.  °Using gargles or lozenges for comfort.  °Controlling fevers with ibuprofen or acetaminophen as directed by your caregiver.  °Increasing usage of your inhaler if you have asthma.  °Return to work when your temperature has returned to normal.  ° °SEEK MEDICAL CARE IF:  °After the first few days, you feel you are getting worse rather than better.  °You  develop worsening shortness of breath, or brown or red sputum. These may be signs of pneumonia.  °You develop yellow or brown nasal discharge or pain in the face, especially when you bend forward. These may be signs of sinusitis.  °You develop a fever, swollen neck glands, pain with swallowing, or white areas in the back of your throat. These may be signs of strep throat.  ° °

## 2013-09-30 NOTE — ED Provider Notes (Signed)
CSN: [id number]     Arrival date & time 09/30/13  4098 History   First MD Initiated Contact with Patient 09/30/13 [phone]     Chief Complaint  Patient presents with  . 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  Tolerating po.  Low grade fevers. No travel.   Patient is a 48 y.o. female presenting with cough. The history is provided by the patient.  Cough Associated symptoms: fever   Associated symptoms: no chest pain, no chills, no headaches, no rash and no shortness of breath     Past Medical History  Diagnosis Date  . Diabetes mellitus   . Arthritis   . Hiatal hernia   . Gall stone   . Stroke   . Coma   . Asthma   . Myalgia and myositis, unspecified 01/13/2013  . Gastroesophageal reflux disease    Past Surgical History  Procedure Laterality Date  . Cesarean section  [phone]   Family History  Problem Relation Age of Onset  . Mental illness Sister   . Diabetes Sister   . Mental illness Son     suicidal thoughts  . Diabetes Father   . Cancer Father    History  Substance Use Topics  . Smoking status: Former Smoker    Quit date: 08/03/1995  . Smokeless tobacco: Not on file  . Alcohol Use: No   OB History   Grav Para Term Preterm Abortions TAB SAB Ect Mult Living                 Review of Systems  Constitutional: Positive for fever. Negative for chills.  HENT: Positive for congestion.   Respiratory: Positive for cough. Negative for shortness of breath.   Cardiovascular: Negative for chest pain.  Gastrointestinal: Negative for vomiting and abdominal pain.  Genitourinary: Negative for dysuria and flank pain.  Musculoskeletal: Negative for neck pain and neck stiffness.  Skin: Negative for rash.  Neurological: Negative for  light-headedness and headaches.    Allergies  Ivp dye; Other; Prednisone; and Ultracet  Home Medications   Current Outpatient Rx  Name  Route  Sig  Dispense  Refill  . albuterol (PROVENTIL HFA;VENTOLIN HFA) 108 (90 BASE) MCG/ACT inhaler   Inhalation   Inhale 2 puffs into the lungs every 6 (six) hours as needed. For shortness of breath         . aspirin 81 MG tablet   Oral   Take 81 mg by mouth daily.         Marland Kitchen buPROPion (WELLBUTRIN XL) 150 MG 24 hr tablet   Oral   Take 150 mg by mouth daily.         . carbamazepine (TEGRETOL) 200 MG tablet   Oral   Take 400 mg by mouth at bedtime.          Marland Kitchen esomeprazole (NEXIUM) 40 MG capsule   Oral   Take 40 mg by mouth daily before breakfast.         . losartan (COZAAR) 25 MG tablet   Oral   Take 25 mg by mouth daily.         . metFORMIN (GLUCOPHAGE) 500 MG tablet   Oral   Take 500 mg by mouth daily with breakfast.         . metoprolol succinate (TOPROL-XL) 25  MG 24 hr tablet   Oral   Take 25 mg by mouth daily.         . Multiple Vitamin (MULTIVITAMIN) tablet   Oral   Take 1 tablet by mouth daily.         . pregabalin (LYRICA) 50 MG capsule      1 capsule at night for one week, then take one capsule twice daily for one week, then take one capsule in the morning and 2 capsules in the evening   90 capsule   3   . triamcinolone cream (KENALOG) 0.1 %   Topical   Apply topically 2 (two) times daily as needed.         . nitroGLYCERIN (NITROSTAT) 0.4 MG SL tablet   Sublingual   Place 0.4 mg under the tongue every 5 (five) minutes as needed for chest pain.          BP 124/83  Pulse 105  Temp(Src) 99.7 F (37.6 C) (Oral)  Resp 18  SpO2 100%  LMP 09/08/2013 Physical Exam  Nursing note and vitals reviewed. Constitutional: She is oriented to person, place, and time. She appears well-developed and well-nourished. No distress.  HENT:  Head: Normocephalic and atraumatic.  Eyes: Conjunctivae are  normal. Right eye exhibits no discharge. Left eye exhibits no discharge.  Neck: Normal range of motion. Neck supple. No tracheal deviation present.  Cardiovascular: Regular rhythm.  Tachycardia present.   Pulmonary/Chest: Effort normal and breath sounds normal.  Musculoskeletal: She exhibits no edema.  Neurological: She is alert and oriented to person, place, and time.  Skin: Skin is warm. No rash noted.  Psychiatric: She has a normal mood and affect.    ED Course  Procedures (including critical care time) Labs Review Labs Reviewed - No data to display Imaging Review Dg Chest 2 View  09/30/2013   CLINICAL DATA:  Cough and fever.  EXAM: CHEST  2 VIEW  COMPARISON:  08/29/2008.  FINDINGS: The heart size and mediastinal contours are within normal limits. Both lungs are clear. The visualized skeletal structures are unremarkable.  IMPRESSION: No active cardiopulmonary disease.   Electronically Signed   By: Maisie Fushomas  Register   On: 09/30/2013 11:18    EKG Interpretation   None       MDM   1. URI (upper respiratory infection)    Well appearing. Lungs clear, afebrile.  Pt has been improving.   PO fluids. Work note discussed.  CXR no acute findings, reviewed.  Fup discussed.  Results and differential diagnosis were discussed with the patient. Close follow up outpatient was discussed, patient comfortable with the plan.   Diagnosis: URI      Enid SkeensJoshua M Dolores Ewing, MD 09/30/13 [phone]

## 2013-09-30 NOTE — ED Notes (Signed)
Per pt, states cough for two days-children have been sick-coughing up clear mucous

## 2013-11-15 ENCOUNTER — Encounter: Payer: Self-pay | Admitting: Nurse Practitioner

## 2013-11-15 ENCOUNTER — Ambulatory Visit (INDEPENDENT_AMBULATORY_CARE_PROVIDER_SITE_OTHER): Payer: Medicaid Other | Admitting: Nurse Practitioner

## 2013-11-15 ENCOUNTER — Encounter (INDEPENDENT_AMBULATORY_CARE_PROVIDER_SITE_OTHER): Payer: Self-pay

## 2013-11-15 VITALS — BP 121/80 | HR 97 | Ht 63.0 in | Wt 170.0 lb

## 2013-11-15 DIAGNOSIS — IMO0001 Reserved for inherently not codable concepts without codable children: Secondary | ICD-10-CM

## 2013-11-15 MED ORDER — PREGABALIN 50 MG PO CAPS: ORAL_CAPSULE | ORAL | Status: DC

## 2013-11-15 NOTE — Progress Notes (Signed)
[facility]  PATIENT: Cassie Mata DOB: [birth year]   REASON FOR VISIT: Followup for fibromyalgia   HISTORY OF PRESENT ILLNESS: Cassie Mata, 48 year old black female returns for followup. She was last seen in this office by Cassie Mata September 9 ,2014. She has a history of fibromyalgia with pain mostly in the back and on the left side of the body. She was on gabapentin at one time,  the medication was not beneficial. She was placed back on Lyrica by Cassie Mata. She is currently taking 50 mg twice daily. It does help with her pain. She also has bipolar disorder and is currently on carbamazepine and Wellbutrin. She goes to [facility] outpatient center. She returns for reevaluation   HISTORY: of fibromyalgia. The patient primarily notes pain on the left side the body with arm and leg. The patient has been placed on gabapentin in low dose, but she could not tolerate the 300 mg capsules. The patient could not tolerate Lyrica secondary to drowsiness previously. The patient is now on the 100 mg capsules of gabapentin taking 1 capsule 3 times daily. The patient has gained some benefit with the medication, but she still has pain. The patient has difficulty sleeping at night, and she indicates that the pain will wake her up when she rolls to the left side. The patient indicates that she has undergone EMG and nerve conduction study evaluations previously and she has had MRI evaluations, but she cannot recall where she had these tests done. The patient returns for an evaluation.  REVIEW OF SYSTEMS: Full 14 system review of systems performed and notable only for those listed, all others are neg:  Constitutional: N/A  Cardiovascular: N/A  Ear/Nose/Throat: N/A  Skin: N/A  Eyes: N/A  Respiratory: N/A  Gastroitestinal: N/A  Hematology/Lymphatic: N/A  Endocrine: Cold intolerance  Musculoskeletal: joint pain, back pain, achy muscles Allergy/Immunology: N/A  Neurological: Weakness,  occasional headache Psychiatric: Bipolar disorder   ALLERGIES: Allergies  Allergen Reactions  . Ivp Dye [Iodinated Diagnostic Agents] Nausea Only  . Other     "allergy medications"= makes heart flutter  . Prednisone Nausea Only    Elevates blood sugar  . Ultracet [Tramadol-Acetaminophen] Nausea And Vomiting    HOME MEDICATIONS: Outpatient Prescriptions Prior to Visit  Medication Sig Dispense Refill  . albuterol (PROVENTIL HFA;VENTOLIN HFA) 108 (90 BASE) MCG/ACT inhaler Inhale 2 puffs into the lungs every 6 (six) hours as needed. For shortness of breath      . aspirin 81 MG tablet Take 81 mg by mouth daily.      Marland Kitchen buPROPion (WELLBUTRIN XL) 150 MG 24 hr tablet Take 150 mg by mouth daily.      . carbamazepine (TEGRETOL) 200 MG tablet Take 400 mg by mouth at bedtime.       Marland Kitchen esomeprazole (NEXIUM) 40 MG capsule Take 40 mg by mouth daily before breakfast.      . HYDROcodone-homatropine (HYCODAN) 5-1.5 MG/5ML syrup Take 5 mLs by mouth every 6 (six) hours as needed for cough.  30 mL  0  . metFORMIN (GLUCOPHAGE) 500 MG tablet Take 500 mg by mouth daily with breakfast.      . metoprolol succinate (TOPROL-XL) 25 MG 24 hr tablet Take 25 mg by mouth daily.      . Multiple Vitamin (MULTIVITAMIN) tablet Take 1 tablet by mouth daily.      . nitroGLYCERIN (NITROSTAT) 0.4 MG SL tablet Place 0.4 mg under the tongue every 5 (five) minutes as needed for chest  pain.      . pregabalin (LYRICA) 50 MG capsule 1 capsule at night for one week, then take one capsule twice daily for one week, then take one capsule in the morning and 2 capsules in the evening  90 capsule  3  . triamcinolone cream (KENALOG) 0.1 % Apply topically 2 (two) times daily as needed.      Marland Kitchen losartan (COZAAR) 25 MG tablet Take 25 mg by mouth daily.      . predniSONE (DELTASONE) 20 MG tablet Take 2 tablets (40 mg total) by mouth daily.  10 tablet  0   No facility-administered medications prior to visit.    PAST MEDICAL HISTORY: Past  Medical History  Diagnosis Date  . Diabetes mellitus   . Arthritis   . Hiatal hernia   . Gall stone   . Stroke   . Coma   . Asthma   . Myalgia and myositis, unspecified 01/13/2013  . Gastroesophageal reflux disease     PAST SURGICAL HISTORY: Past Surgical History  Procedure Laterality Date  . Cesarean section  [phone]    FAMILY HISTORY: Family History  Problem Relation Age of Onset  . Mental illness Sister   . Diabetes Sister   . Mental illness Son     suicidal thoughts  . Diabetes Father   . Cancer Father     SOCIAL HISTORY: History   Social History  . Marital Status: Married    Spouse Name: N/A    Number of Children: 4  . Years of Education: 11   Occupational History  . [employer]    Social History Main Topics  . Smoking status: Former Smoker    Quit date: 08/03/1995  . Smokeless tobacco: Never Used  . Alcohol Use: No  . Drug Use: No  . Sexual Activity: Not on file   Other Topics Concern  . Not on file   Social History Narrative   Patient lives at home with her 2 children.    Patient has 4 children.    Patient has currently working part time.    Patient has 11th grade education.    Patient has a common law marriage     PHYSICAL EXAM  Filed Vitals:   11/15/13 1109  BP: 121/80  Pulse: 97  Height: 5\' 3"  (1.6 m)  Weight: 170 lb (77.111 kg)   Body mass index is 30.12 kg/(m^2).  Generalized: Well developed, in no acute distress  Head: normocephalic and atraumatic,. Oropharynx benign  Skin  No significant peripheral edema Neurological examination   Mentation: Alert oriented to time, place, history taking. Follows all commands speech and language fluent  Cranial nerve II-XII: Pupils were equal round reactive to light extraocular movements were full, visual field were full on confrontational test. Facial sensation and strength were normal. hearing was intact to finger rubbing bilaterally. Uvula tongue midline. head turning and  shoulder shrug were normal and symmetric.Tongue protrusion into cheek strength was normal. Motor: normal bulk and tone, full strength in the BUE, BLE, except giveaway weakness in the left upper extremity Coordination: finger-nose-finger, heel-to-shin bilaterally, no dysmetria Reflexes: Brachioradialis 2/2, biceps 2/2, triceps 2/2, patellar 2/2, Achilles 2/2, plantar responses were flexor bilaterally. Gait and Station: Rising up from seated position without assistance, normal stance,  moderate stride, good arm swing, smooth turning, able to perform tiptoe, and heel walking without difficulty. Tandem gait is steady.   DIAGNOSTIC DATA (LABS, IMAGING, TESTING) -    ASSESSMENT AND PLAN  48 y.o. year  old female  has a past medical history of fibromyalgia. Patient is currently on Lyrica 50 twice a day.   Increase Lyrica to 1 am, 1 around 2 pm, 2 at bedtime Walk for exercise F/U in 6 to 8 months Nilda RiggsNancy Carolyn Lety Cullens, [facility]lipGNP, [facility]BC, APRN  [facility] [address], [address] [place], [place] [address] [phone]

## 2013-11-15 NOTE — Patient Instructions (Signed)
Increase Lyrica to 1 am, 1 around 2 pm, 2 at bedtime Walk for exercise F/U in 6 to 8 months

## 2013-11-15 NOTE — Progress Notes (Signed)
I have read the note, and I agree with the clinical assessment and plan.  Enes Wegener KEITH   

## 2013-11-16 ENCOUNTER — Telehealth: Payer: Self-pay | Admitting: Nurse Practitioner

## 2013-11-16 NOTE — Telephone Encounter (Signed)
Patient would like a call back from Darrol Angelarolyn Martin regarding her nerves. Please call to advise.

## 2013-11-17 NOTE — Telephone Encounter (Signed)
Called patient and patient stated that she was having pain on her left side. Patient was seen in the office yesterday and Eber Jonesarolyn, NP increased the patient's Lyrica to 1 am, 1 at 2 pm and 2 at bedtime. Patient stated that she was already taking Lyrica 1 am and 2 pm. I asked the patient if she has already started the increase, patient stated that she has not. I advised the patient to start Lyrica as Eber Jonesarolyn, NP stated in her OV notes and give it at least 3-4 weeks and if patient is still having the pain to call back. Patient verbalized understanding.

## 2014-03-15 ENCOUNTER — Telehealth: Payer: Self-pay | Admitting: Nurse Practitioner

## 2014-03-15 NOTE — Telephone Encounter (Signed)
Patient requesting call back from a nurse regarding her fibromyalgia pain. Please call to advise.

## 2014-03-16 NOTE — Telephone Encounter (Signed)
I spoke to pt and she is having increased pain in back, neck, L arm.  She is taking her lyrica as prescribed.  She had to get off phone quickly due to her ride.   Would have to speak to tomorrow.

## 2014-03-20 NOTE — Telephone Encounter (Signed)
I called and LMVM for pt to return call.   

## 2014-03-21 NOTE — Telephone Encounter (Signed)
LMVM for pt to return call.   

## 2014-05-08 ENCOUNTER — Telehealth: Payer: Self-pay | Admitting: *Deleted

## 2014-05-08 NOTE — Telephone Encounter (Signed)
Called patient and left a message that her appt scheduled for 05-23-14 needs to be rescheduled.

## 2014-05-10 ENCOUNTER — Encounter: Payer: Self-pay | Admitting: *Deleted

## 2014-05-10 NOTE — Telephone Encounter (Signed)
Left patient a message that her appointment scheduled for 05-23-14 has been canceled. Patient needs to call the office to r/s. Will mail patient a letter as well.

## 2014-05-23 ENCOUNTER — Ambulatory Visit: Payer: Medicaid Other | Admitting: Nurse Practitioner

## 2014-07-04 ENCOUNTER — Other Ambulatory Visit: Payer: Self-pay

## 2014-07-04 MED ORDER — PREGABALIN 50 MG PO CAPS: ORAL_CAPSULE | ORAL | Status: DC

## 2014-07-04 NOTE — Telephone Encounter (Signed)
Rx signed and faxed.

## 2014-07-13 ENCOUNTER — Telehealth: Payer: Self-pay | Admitting: *Deleted

## 2014-07-13 NOTE — Telephone Encounter (Signed)
Tried calling patient to reschedule appt due to cm admin time. No answer no voicemail. [facility] so patient would not have to be rescheduled again.

## 2014-08-02 ENCOUNTER — Ambulatory Visit: Payer: Self-pay | Admitting: Neurology

## 2014-08-07 ENCOUNTER — Encounter: Payer: Self-pay | Admitting: Neurology

## 2014-08-07 ENCOUNTER — Ambulatory Visit (INDEPENDENT_AMBULATORY_CARE_PROVIDER_SITE_OTHER): Payer: Medicaid Other | Admitting: Neurology

## 2014-08-07 VITALS — BP 124/83 | HR 85 | Ht 63.0 in | Wt 165.4 lb

## 2014-08-07 DIAGNOSIS — M609 Myositis, unspecified: Secondary | ICD-10-CM

## 2014-08-07 DIAGNOSIS — IMO0001 Reserved for inherently not codable concepts without codable children: Secondary | ICD-10-CM

## 2014-08-07 DIAGNOSIS — M791 Myalgia: Secondary | ICD-10-CM

## 2014-08-07 DIAGNOSIS — G4700 Insomnia, unspecified: Secondary | ICD-10-CM

## 2014-08-07 HISTORY — DX: Insomnia, unspecified: G47.00

## 2014-08-07 MED ORDER — PREGABALIN 100 MG PO CAPS: 100.0000 mg | ORAL_CAPSULE | Freq: Three times a day (TID) | ORAL | Status: DC

## 2014-08-07 NOTE — Progress Notes (Signed)
Reason for visit: Fibromyalgia  Cassie Mata is an 48 y.o. female  History of present illness:  Cassie Mata is a 48 year old right-handed white female with a history of diabetes and fibromyalgia. The patient claims that she had a stroke several years ago affecting the left side. She also indicates that she has sleep apnea, but she is not on CPAP. She indicates that the sleep apnea was diagnosed in 2008. She is to have mainly left-sided features with her discomfort involving the left neck and shoulder with left occipital headache. She indicates chronic low back pain and pain into the left leg. She does not stretch on a regular basis. She indicates that she will walk on occasion. She is on Lyrica taking 50 mg capsules, 1 in the morning and 1 at midday and 2 at night. She is still having problems with chronic insomnia. She indicates that [place]un[place] previously that she claims resulted in a stroke. She returns this office for an evaluation.  Past Medical History  Diagnosis Date  . Diabetes mellitus   . Arthritis   . Hiatal hernia   . Gall stone   . Stroke   . Coma   . Asthma   . Myalgia and myositis, unspecified 01/13/2013  . Gastroesophageal reflux disease   . Insomnia 08/07/2014    Past Surgical History  Procedure Laterality Date  . Cesarean section  [phone],1996,2006    Family History  Problem Relation Age of Onset  . Mental illness Sister   . Diabetes Sister   . Mental illness Son     suicidal thoughts  . Diabetes Father   . Cancer Father     Social history:  reports that she quit smoking about 19 years ago. She has never used smokeless tobacco. She reports that she does not drink alcohol or use illicit drugs.    Allergies  Allergen Reactions  . Ivp Dye [Iodinated Diagnostic Agents] Nausea Only  . Lunesta [Eszopiclone]   . Other     "allergy medications"= makes heart flutter  . Prednisone Nausea Only    Elevates blood sugar  . Ultracet [Tramadol-Acetaminophen]  Nausea And Vomiting    Medications:  Current Outpatient Prescriptions on File Prior to Visit  Medication Sig Dispense Refill  . albuterol (PROVENTIL HFA;VENTOLIN HFA) 108 (90 BASE) MCG/ACT inhaler Inhale 2 puffs into the lungs every 6 (six) hours as needed. For shortness of breath    . aspirin 81 MG tablet Take 81 mg by mouth daily.    Marland Kitchen. LISINOPRIL PO Take by mouth.    . metFORMIN (GLUCOPHAGE) 500 MG tablet Take 500 mg by mouth daily with breakfast.    . metoprolol succinate (TOPROL-XL) 25 MG 24 hr tablet Take 25 mg by mouth daily.    . Multiple Vitamin (MULTIVITAMIN) tablet Take 1 tablet by mouth daily.    . nitroGLYCERIN (NITROSTAT) 0.4 MG SL tablet Place 0.4 mg under the tongue every 5 (five) minutes as needed for chest pain.    Marland Kitchen. triamcinolone cream (KENALOG) 0.1 % Apply topically 2 (two) times daily as needed.     No current facility-administered medications on file prior to visit.    ROS:  Out of a complete 14 system review of symptoms, the patient complains only of the following symptoms, and all other reviewed systems are negative.  Cold intolerance Insomnia, apnea Joint pain, back pain, muscle cramps Headache Depression, anxiety  Blood pressure 124/83, pulse 85, height 5\' 3"  (1.6 m), weight 165 lb 6.4 oz (  75.025 kg).  Physical Exam  General: The patient is alert and cooperative at the time of the examination. The patient is minimally obese.  Neuromuscular: Range of movement of the cervical spine and lumbosacral spine is relatively full.  Skin: No significant peripheral edema is noted.   Neurologic Exam  Mental status: The patient is oriented x 3.  Cranial nerves: Facial symmetry is present. Speech is normal, no aphasia or dysarthria is noted. Extraocular movements are full. Visual fields are full.  Motor: The patient has good strength in all 4 extremities.  Sensory examination: Soft touch sensation is symmetric on the face, arms, and legs.  Coordination: The  patient has good finger-nose-finger and heel-to-shin bilaterally.  Gait and station: The patient has a normal gait. Tandem gait is minimally unsteady. Romberg is negative. No drift is seen.  Reflexes: Deep tendon reflexes are symmetric.   Assessment/Plan:  1. Fibromyalgia  2. Chronic insomnia  The patient indicates that she continues to have cervicogenic headache on the left occipital area, along with left neck and shoulder pain and back pain and left leg pain. Clinical examination is relatively unremarkable. The patient reports arthritis in the feet, and she indicates that she will have to have surgery on her toes in the future. She is unable to take nonsteroidal anti-inflammatory medications secondary to stomach upset. Lyrica will be increased taking 100 mg 3 times daily. She will follow-up in 6 months. I have encouraged her to walk on a regular basis, and stretch the back and neck regularly.  Marlan Palau. Keith Young Mulvey MD 08/07/2014 7:18 PM  [facility] [address] [place], [place] [id number]  Phone [phone] Fax [phone]

## 2014-08-07 NOTE — Patient Instructions (Signed)
Fibromyalgia Fibromyalgia is a disorder that is often misunderstood. It is associated with muscular pains and tenderness that comes and goes. It is often associated with fatigue and sleep disturbances. Though it tends to be long-lasting, fibromyalgia is not life-threatening. CAUSES  The exact cause of fibromyalgia is unknown. People with certain gene types are predisposed to developing fibromyalgia and other conditions. Certain factors can play a role as triggers, such as:  Spine disorders.  Arthritis.  Severe injury (trauma) and other physical stressors.  Emotional stressors. SYMPTOMS   The main symptom is pain and stiffness in the muscles and joints, which can vary over time.  Sleep and fatigue problems. Other related symptoms may include:  Bowel and bladder problems.  Headaches.  Visual problems.  Problems with odors and noises.  Depression or mood changes.  Painful periods (dysmenorrhea).  Dryness of the skin or eyes. DIAGNOSIS  There are no specific tests for diagnosing fibromyalgia. Patients can be diagnosed accurately from the specific symptoms they have. The diagnosis is made by determining that nothing else is causing the problems. TREATMENT  There is no cure. Management includes medicines and an active, healthy lifestyle. The goal is to enhance physical fitness, decrease pain, and improve sleep. HOME CARE INSTRUCTIONS   Only take over-the-counter or prescription medicines as directed by your caregiver. Sleeping pills, tranquilizers, and pain medicines may make your problems worse.  Low-impact aerobic exercise is very important and advised for treatment. At first, it may seem to make pain worse. Gradually increasing your tolerance will overcome this feeling.  Learning relaxation techniques and how to control stress will help you. Biofeedback, visual imagery, hypnosis, muscle relaxation, yoga, and meditation are all options.  Anti-inflammatory medicines and  physical therapy may provide short-term help.  Acupuncture or massage treatments may help.  Take muscle relaxant medicines as suggested by your caregiver.  Avoid stressful situations.  Plan a healthy lifestyle. This includes your diet, sleep, rest, exercise, and friends.  Find and practice a hobby you enjoy.  Join a fibromyalgia support group for interaction, ideas, and sharing advice. This may be helpful. SEEK MEDICAL CARE IF:  You are not having good results or improvement from your treatment. FOR MORE INFORMATION  National Fibromyalgia Association: www.fmaware.org Arthritis Foundation: www.arthritis.org Document Released: 08/25/2005 Document Revised: 11/17/2011 Document Reviewed: 12/05/2009 ExitCare Patient Information 2015 [employer]. This information is not intended to replace advice given to you by your health care provider. Make sure you discuss any questions you have with your health care provider.  

## 2014-12-08 ENCOUNTER — Encounter: Payer: Self-pay | Admitting: Adult Health

## 2014-12-08 ENCOUNTER — Ambulatory Visit (INDEPENDENT_AMBULATORY_CARE_PROVIDER_SITE_OTHER): Payer: Medicaid Other | Admitting: Adult Health

## 2014-12-08 ENCOUNTER — Ambulatory Visit: Payer: Medicaid Other | Admitting: Nurse Practitioner

## 2014-12-08 VITALS — BP 128/90 | HR 92 | Ht 63.0 in | Wt 171.0 lb

## 2014-12-08 DIAGNOSIS — M797 Fibromyalgia: Secondary | ICD-10-CM | POA: Diagnosis not present

## 2014-12-08 NOTE — Progress Notes (Signed)
I have read the note, and I agree with the clinical assessment and plan.  WILLIS,CHARLES KEITH   

## 2014-12-08 NOTE — Progress Notes (Signed)
PATIENT: Cassie Mata DOB: [birth year]  REASON FOR VISIT: follow up- Fibromyalgia HISTORY FROM: patient  HISTORY OF PRESENT ILLNESS: Cassie Mata is a 49 year old female with a history of fibromyalgia. She returns today for follow-up. The patient is currently taking Lyrica 100 mg 3 times a day. She states that she continues to have pain but it does help. She states that her pain is normally located in the back and down the legs as well as the neck. She states most of her pain occurs on the left side due to her previous stroke? She states that triggers for her pain is the weather as well as stress. She is currently on Lexapro for anxiety and states that she is also on alprazolam? She states that the Lyrica will cause drowsiness and therefore sometimes she does not take the doses 3 times a day. She saw her primary care provider who recommended possibly doing 300 mg twice a day. Patient also states that she is interested in seeing a rheumatologist and has requested from our front as a list of rheumatology offices that will take her insurance. She states that she has not been doing much stretching she tries to exercise occasionally. She returns today for an evaluation.  HISTORY 08/07/14 (WILLIS): Cassie Mata is a 49 year old right-handed white female with a history of diabetes and fibromyalgia. The patient claims that she had a stroke several years ago affecting the left side. She also indicates that she has sleep apnea, but she is not on CPAP. She indicates that the sleep apnea was diagnosed in 2008. She is to have mainly left-sided features with her discomfort involving the left neck and shoulder with left occipital headache. She indicates chronic low back pain and pain into the left leg. She does not stretch on a regular basis. She indicates that she will walk on occasion. She is on Lyrica taking 50 mg capsules, 1 in the morning and 1 at midday and 2 at night. She is still having problems with chronic  insomnia. She indicates that [place] previously that she claims resulted in a stroke. She returns this office for an evaluation.  REVIEW OF SYSTEMS: Out of a complete 14 system review of symptoms, the patient complains only of the following symptoms, and all other reviewed systems are negative.  Cough, wheezing, shortness of breath, chest pain, palpitations, cold intolerance, excessive thirst, excessive eating, insomnia, apnea, daytime sleepiness, snoring, sleep talking, joint pain, back pain, aching muscles, neck pain, neck stiffness, memory loss, dizziness, headache, weakness, facial drooping, depression, nervous    ALLERGIES: Allergies  Allergen Reactions  . Ivp Dye [Iodinated Diagnostic Agents] Nausea Only  . Lunesta [Eszopiclone]   . Other     "allergy medications"= makes heart flutter  . Prednisone Nausea Only    Elevates blood sugar  . Ultracet [Tramadol-Acetaminophen] Nausea And Vomiting    HOME MEDICATIONS: Outpatient Prescriptions Prior to Visit  Medication Sig Dispense Refill  . albuterol (PROVENTIL HFA;VENTOLIN HFA) 108 (90 BASE) MCG/ACT inhaler Inhale 2 puffs into the lungs every 6 (six) hours as needed. For shortness of breath    . aspirin 81 MG tablet Take 81 mg by mouth daily.    Marland Kitchen LISINOPRIL PO Take by mouth.    . metFORMIN (GLUCOPHAGE) 500 MG tablet Take 500 mg by mouth daily with breakfast.    . metoprolol succinate (TOPROL-XL) 25 MG 24 hr tablet Take 25 mg by mouth daily.    . Multiple Vitamin (MULTIVITAMIN) tablet Take 1 tablet by  mouth daily.    . nitroGLYCERIN (NITROSTAT) 0.4 MG SL tablet Place 0.4 mg under the tongue every 5 (five) minutes as needed for chest pain.    . pregabalin (LYRICA) 100 MG capsule Take 1 capsule (100 mg total) by mouth 3 (three) times daily. 90 capsule 5  . triamcinolone cream (KENALOG) 0.1 % Apply topically 2 (two) times daily as needed.     No facility-administered medications prior to visit.    PAST MEDICAL HISTORY: Past Medical  History  Diagnosis Date  . Diabetes mellitus   . Arthritis   . Hiatal hernia   . Gall stone   . Stroke   . Coma   . Asthma   . Myalgia and myositis, unspecified 01/13/2013  . Gastroesophageal reflux disease   . Insomnia 08/07/2014    PAST SURGICAL HISTORY: Past Surgical History  Procedure Laterality Date  . Cesarean section  [phone],1996,2006    FAMILY HISTORY: Family History  Problem Relation Age of Onset  . Mental illness Sister   . Diabetes Sister   . Mental illness Son     suicidal thoughts  . Diabetes Father   . Cancer Father     SOCIAL HISTORY: History   Social History  . Marital Status: Married    Spouse Name: N/A  . Number of Children: 4  . Years of Education: 11   Occupational History  . [id number]    Social History Main Topics  . Smoking status: Former Smoker    Quit date: 08/03/1995  . Smokeless tobacco: Never Used  . Alcohol Use: No  . Drug Use: No  . Sexual Activity: Not on file   Other Topics Concern  . Not on file   Social History Narrative   Patient lives at home with her 2 children.    Patient has 4 children.    Patient has currently working part time.    Patient has 11th grade education.    Patient has a common law marriage      PHYSICAL EXAM  Filed Vitals:   12/08/14 0932  BP: 128/90  Pulse: 92  Height: 5\' 3"  (1.6 m)  Weight: 171 lb (77.565 kg)   Body mass index is 30.3 kg/(m^2).  Generalized: Well developed, in no acute distress   Neurological examination  Mentation: Alert oriented to time, place, history taking. Follows all commands speech and language fluent Cranial nerve II-XII: Pupils were equal round reactive to light. Extraocular movements were full, visual field were full on confrontational test. Facial sensation and strength were normal. Uvula tongue midline. Head turning and shoulder shrug  were normal and symmetric. Motor: The motor testing reveals 5 over 5 strength of all 4 extremities. Good symmetric motor  tone is noted throughout.  Sensory: Sensory testing is intact to soft touch on all 4 extremities. No evidence of extinction is noted.  Coordination: Cerebellar testing reveals good finger-nose-finger and heel-to-shin bilaterally.  Gait and station: Gait is normal. Tandem gait is normal. Romberg is negative. No drift is seen.  Reflexes: Deep tendon reflexes are symmetric and normal bilaterally.  Marland Kitchen.   DIAGNOSTIC DATA (LABS, IMAGING, TESTING) - I reviewed patient records, labs, notes, testing and imaging myself where available.   ASSESSMENT AND PLAN 49 y.o. year old female  has a past medical history of Diabetes mellitus; Arthritis; Hiatal hernia; Gall stone; Stroke; Coma; Asthma; Myalgia and myositis, unspecified (01/13/2013); Gastroesophageal reflux disease; and Insomnia (08/07/2014). here with:  1. Fibromyalgia  Patient states that the Lyrica is  helping her pain but does not resolve it. She currently experiences drowsiness taking 100 mg 3 times a day. However her PCP recommended increasing to 300 mg twice a day. I explained to the patient that I'm not sure that this will improve her drowsiness. She is also interested in seeing rheumatology. She is currently looking at possible rheumatologists that will take her insurance. I advised the patient to let me know if she is interested and I will make a referral. For now we will keep the patient on Lyrica 100 mg 3 times a day. Patient is amenable to this. Advised the patient that she should try stretching at least 3 times a day. She verbalized understanding. She will follow-up in 6 months or sooner if needed.   Butch Penny, MSN, NP-C 12/08/2014, 9:26 AM [facility] [address] [place], [place] [address] [phone]  Note: This document was prepared with digital dictation and possible smart phrase technology. Any transcriptional errors that result from this process are unintentional.

## 2014-12-08 NOTE — Patient Instructions (Signed)
Continue taking lyrica 100 mg three times a day Try stretching and exercising three times a day Let me know if you want to be referred to rheumatology.

## 2015-02-08 ENCOUNTER — Ambulatory Visit: Payer: Medicaid Other

## 2015-02-15 ENCOUNTER — Ambulatory Visit: Payer: Medicaid Other

## 2015-02-22 ENCOUNTER — Ambulatory Visit: Payer: Medicaid Other

## 2015-02-26 ENCOUNTER — Emergency Department (HOSPITAL_COMMUNITY)
Admission: EM | Admit: 2015-02-26 | Discharge: 2015-02-26 | Disposition: A | Discharge status: Home / Self Care | Payer: Medicaid Other | Attending: Emergency Medicine | Admitting: Emergency Medicine

## 2015-02-26 ENCOUNTER — Encounter (HOSPITAL_COMMUNITY): Payer: Self-pay

## 2015-02-26 DIAGNOSIS — M797 Fibromyalgia: Secondary | ICD-10-CM | POA: Insufficient documentation

## 2015-02-26 DIAGNOSIS — Z79899 Other long term (current) drug therapy: Secondary | ICD-10-CM | POA: Insufficient documentation

## 2015-02-26 DIAGNOSIS — E119 Type 2 diabetes mellitus without complications: Secondary | ICD-10-CM | POA: Insufficient documentation

## 2015-02-26 DIAGNOSIS — M1990 Unspecified osteoarthritis, unspecified site: Secondary | ICD-10-CM | POA: Insufficient documentation

## 2015-02-26 DIAGNOSIS — L0201 Cutaneous abscess of face: Secondary | ICD-10-CM | POA: Insufficient documentation

## 2015-02-26 DIAGNOSIS — Z7982 Long term (current) use of aspirin: Secondary | ICD-10-CM | POA: Insufficient documentation

## 2015-02-26 DIAGNOSIS — Z87891 Personal history of nicotine dependence: Secondary | ICD-10-CM | POA: Insufficient documentation

## 2015-02-26 DIAGNOSIS — H02846 Edema of left eye, unspecified eyelid: Secondary | ICD-10-CM | POA: Insufficient documentation

## 2015-02-26 DIAGNOSIS — K219 Gastro-esophageal reflux disease without esophagitis: Secondary | ICD-10-CM | POA: Insufficient documentation

## 2015-02-26 DIAGNOSIS — Z8673 Personal history of transient ischemic attack (TIA), and cerebral infarction without residual deficits: Secondary | ICD-10-CM | POA: Insufficient documentation

## 2015-02-26 DIAGNOSIS — J45909 Unspecified asthma, uncomplicated: Secondary | ICD-10-CM | POA: Insufficient documentation

## 2015-02-26 DIAGNOSIS — Z7951 Long term (current) use of inhaled steroids: Secondary | ICD-10-CM | POA: Insufficient documentation

## 2015-02-26 HISTORY — DX: Fibromyalgia: M79.7

## 2015-02-26 MED ORDER — CEPHALEXIN 250 MG PO CAPS: 250.0000 mg | ORAL_CAPSULE | Freq: Four times a day (QID) | ORAL | Status: DC

## 2015-02-26 NOTE — ED Provider Notes (Signed)
CSN: [id number]     Arrival date & time 02/26/15  1223 History  This chart was scribed for non-physician practitioner, Jaynie Crumble, PA-C working with Toy Cookey, MD by Gwenyth Ober, ED scribe. This patient was seen in room WTR8/WTR8 and the patient's care was started at 1:09 PM   Chief Complaint  Patient presents with  . Abscess  . Facial Swelling   The history is provided by the patient. No language interpreter was used.   HPI Comments: Cassie Mata is a 49 y.o. female who presents to the Emergency Department complaining of a constant, gradually worsening area of redness and swelling on her left eye lid that appeared 2 days ago. She reports purulent drainage from the site as an associated symptom. Pt tried popping the area and applying Epsom salt with warm compresses with no relief. Her symptoms do not change with moving her eye. Pt denies eye pain as an associated symptom.  Past Medical History  Diagnosis Date  . Diabetes mellitus   . Arthritis   . Hiatal hernia   . Gall stone   . Stroke   . Coma   . Asthma   . Myalgia and myositis, unspecified 01/13/2013  . Gastroesophageal reflux disease   . Insomnia 08/07/2014  . Fibromyalgia    Past Surgical History  Procedure Laterality Date  . Cesarean section  [phone]   Family History  Problem Relation Age of Onset  . Mental illness Sister   . Diabetes Sister   . Mental illness Son     suicidal thoughts  . Diabetes Father   . Cancer Father    History  Substance Use Topics  . Smoking status: Former Smoker    Quit date: 08/03/1995  . Smokeless tobacco: Never Used  . Alcohol Use: No   OB History    No data available     Review of Systems  Constitutional: Negative for fever.  Eyes: Positive for redness. Negative for pain.  Skin: Positive for wound.   Allergies  Ivp dye; Lunesta; Other; Prednisone; and Ultracet  Home Medications   Prior to Admission medications   Medication Sig Start Date End  Date Taking? Authorizing Provider  albuterol (PROVENTIL HFA;VENTOLIN HFA) 108 (90 BASE) MCG/ACT inhaler Inhale 2 puffs into the lungs every 6 (six) hours as needed. For shortness of breath   Yes Historical Provider, MD  aspirin 81 MG tablet Take 81 mg by mouth daily.   Yes Historical Provider, MD  cholecalciferol (VITAMIN D) 1000 UNITS tablet Take 2,000 Units by mouth at bedtime.    Yes Historical Provider, MD  escitalopram (LEXAPRO) 10 MG tablet Take 10 mg by mouth daily.   Yes Historical Provider, MD  Fluticasone-Salmeterol (ADVAIR) 250-50 MCG/DOSE AEPB Inhale 1 puff into the lungs 2 (two) times daily.   Yes Historical Provider, MD  lisinopril (PRINIVIL,ZESTRIL) 10 MG tablet Take 10 mg by mouth daily.    Yes Historical Provider, MD  metFORMIN (GLUCOPHAGE) 500 MG tablet Take 500 mg by mouth 2 (two) times daily.    Yes Historical Provider, MD  metoprolol succinate (TOPROL-XL) 25 MG 24 hr tablet Take 25 mg by mouth daily.   Yes Historical Provider, MD  Multiple Vitamin (MULTIVITAMIN) tablet Take 1 tablet by mouth daily.   Yes Historical Provider, MD  nitroGLYCERIN (NITROSTAT) 0.4 MG SL tablet Place 0.4 mg under the tongue every 5 (five) minutes as needed for chest pain.   Yes Historical Provider, MD  pantoprazole (PROTONIX) 40 MG tablet Take 40  mg by mouth daily.   Yes Historical Provider, MD  pregabalin (LYRICA) 100 MG capsule Take 1 capsule (100 mg total) by mouth 3 (three) times daily. Patient not taking: Reported on 02/26/2015 08/07/14   York Spaniel, MD   BP 136/82 mmHg  Pulse 100  Temp(Src) 98.5 F (36.9 C) (Oral)  Resp 16  SpO2 98% Physical Exam  Constitutional: She is oriented to person, place, and time. She appears well-developed and well-nourished. No distress.  HENT:  Head: Normocephalic and atraumatic.  Small, already drained at home abscess to the left forehead, no surrounding erythema. Mild swelling to the left eyelid with no tenderness to palpation. No pain with EOM.  Conjunctiva normal.  Eyes: Conjunctivae and EOM are normal.  Neck: Neck supple.  Cardiovascular: Normal rate.   Pulmonary/Chest: Effort normal. No respiratory distress.  Lymphadenopathy:    She has no cervical adenopathy.  Neurological: She is alert and oriented to person, place, and time.  Skin: Skin is warm and dry.  Psychiatric: She has a normal mood and affect. Her behavior is normal.  Nursing note and vitals reviewed.   ED Course  Procedures   DIAGNOSTIC STUDIES: Oxygen Saturation is 98% on RA, normal by my interpretation.    COORDINATION OF CARE: 1:14 PM Discussed treatment plan with pt which includes warm compresses several times daily and antibiotic treatment. Advised pt to return with worsening symptoms. Pt agreed to plan.  Labs Review Labs Reviewed - No data to display  Imaging Review No results found.   EKG Interpretation None      MDM   Final diagnoses:  Abscess of forehead  Swelling of left eyelid    left forehead abscesses that she already drained at home. Does not appear to have any more to drain at this time. There is mild swelling to the left eyelid with no tenderness, no erythema, no pain with extraocular movement. Most likely dependent edema from this abscess. Home with Keflex for possible cellulitis. Follow-up with primary care doctor.  Filed Vitals:   02/26/15 1228  BP: 136/82  Pulse: 100  Temp: 98.5 F (36.9 C)  TempSrc: Oral  Resp: 16  SpO2: 98%   I personally performed the services described in this documentation, which was scribed in my presence. The recorded information has been reviewed and is accurate.    Jaynie Crumble, PA-C 02/26/15 1322  Toy Cookey, MD 02/28/15 1122

## 2015-02-26 NOTE — ED Notes (Signed)
Pt reports having L eyelid swelling, due to an abscess/boil on L forehead.  Pain only with squeezing.  Pt reports putting a warm compress on abscess and sts that it drained.  Sts "it's gone way down."

## 2015-02-26 NOTE — Discharge Instructions (Signed)
Warm compresses to the forehead. Do not pick or squeeze it. Apply cool compresses to the eye. Keflex as prescribed until all gone. Follow up with your doctor for recheck. Return if worsening.    Abscess An abscess is an infected area that contains a collection of pus and debris.It can occur in almost any part of the body. An abscess is also known as a furuncle or boil. CAUSES  An abscess occurs when tissue gets infected. This can occur from blockage of oil or sweat glands, infection of hair follicles, or a minor injury to the skin. As the body tries to fight the infection, pus collects in the area and creates pressure under the skin. This pressure causes pain. People with weakened immune systems have difficulty fighting infections and get certain abscesses more often.  SYMPTOMS Usually an abscess develops on the skin and becomes a painful mass that is red, warm, and tender. If the abscess forms under the skin, you may feel a moveable soft area under the skin. Some abscesses break open (rupture) on their own, but most will continue to get worse without care. The infection can spread deeper into the body and eventually into the bloodstream, causing you to feel ill.  DIAGNOSIS  Your caregiver will take your medical history and perform a physical exam. A sample of fluid may also be taken from the abscess to determine what is causing your infection. TREATMENT  Your caregiver may prescribe antibiotic medicines to fight the infection. However, taking antibiotics alone usually does not cure an abscess. Your caregiver may need to make a small cut (incision) in the abscess to drain the pus. In some cases, gauze is packed into the abscess to reduce pain and to continue draining the area. HOME CARE INSTRUCTIONS   Only take over-the-counter or prescription medicines for pain, discomfort, or fever as directed by your caregiver.  If you were prescribed antibiotics, take them as directed. Finish them even if you  start to feel better.  If gauze is used, follow your caregiver's directions for changing the gauze.  To avoid spreading the infection:  Keep your draining abscess covered with a bandage.  Wash your hands well.  Do not share personal care items, towels, or whirlpools with others.  Avoid skin contact with others.  Keep your skin and clothes clean around the abscess.  Keep all follow-up appointments as directed by your caregiver. SEEK MEDICAL CARE IF:   You have increased pain, swelling, redness, fluid drainage, or bleeding.  You have muscle aches, chills, or a general ill feeling.  You have a fever. MAKE SURE YOU:   Understand these instructions.  Will watch your condition.  Will get help right away if you are not doing well or get worse. Document Released: 06/04/2005 Document Revised: 02/24/2012 Document Reviewed: 11/07/2011 [facility] Patient Information 2015 [place], [place]. This information is not intended to replace advice given to you by your health care provider. Make sure you discuss any questions you have with your health care provider.

## 2015-05-07 ENCOUNTER — Ambulatory Visit: Payer: Medicaid Other

## 2015-05-10 ENCOUNTER — Emergency Department (HOSPITAL_COMMUNITY): Payer: Medicaid Other

## 2015-05-10 ENCOUNTER — Encounter (HOSPITAL_COMMUNITY): Payer: Self-pay | Admitting: Emergency Medicine

## 2015-05-10 ENCOUNTER — Emergency Department (HOSPITAL_COMMUNITY)
Admission: EM | Admit: 2015-05-10 | Discharge: 2015-05-10 | Disposition: A | Discharge status: Home / Self Care | Payer: Medicaid Other | Attending: Physician Assistant | Admitting: Physician Assistant

## 2015-05-10 DIAGNOSIS — M1990 Unspecified osteoarthritis, unspecified site: Secondary | ICD-10-CM | POA: Insufficient documentation

## 2015-05-10 DIAGNOSIS — Z8673 Personal history of transient ischemic attack (TIA), and cerebral infarction without residual deficits: Secondary | ICD-10-CM | POA: Insufficient documentation

## 2015-05-10 DIAGNOSIS — Z8669 Personal history of other diseases of the nervous system and sense organs: Secondary | ICD-10-CM | POA: Insufficient documentation

## 2015-05-10 DIAGNOSIS — J45909 Unspecified asthma, uncomplicated: Secondary | ICD-10-CM | POA: Insufficient documentation

## 2015-05-10 DIAGNOSIS — Y998 Other external cause status: Secondary | ICD-10-CM | POA: Insufficient documentation

## 2015-05-10 DIAGNOSIS — Y92811 Bus as the place of occurrence of the external cause: Secondary | ICD-10-CM | POA: Insufficient documentation

## 2015-05-10 DIAGNOSIS — Z87891 Personal history of nicotine dependence: Secondary | ICD-10-CM | POA: Insufficient documentation

## 2015-05-10 DIAGNOSIS — Y9389 Activity, other specified: Secondary | ICD-10-CM | POA: Insufficient documentation

## 2015-05-10 DIAGNOSIS — E119 Type 2 diabetes mellitus without complications: Secondary | ICD-10-CM | POA: Insufficient documentation

## 2015-05-10 DIAGNOSIS — Z7982 Long term (current) use of aspirin: Secondary | ICD-10-CM | POA: Insufficient documentation

## 2015-05-10 DIAGNOSIS — Z79899 Other long term (current) drug therapy: Secondary | ICD-10-CM | POA: Insufficient documentation

## 2015-05-10 DIAGNOSIS — K219 Gastro-esophageal reflux disease without esophagitis: Secondary | ICD-10-CM | POA: Insufficient documentation

## 2015-05-10 DIAGNOSIS — W228XXA Striking against or struck by other objects, initial encounter: Secondary | ICD-10-CM | POA: Insufficient documentation

## 2015-05-10 DIAGNOSIS — M25531 Pain in right wrist: Secondary | ICD-10-CM

## 2015-05-10 DIAGNOSIS — S6991XA Unspecified injury of right wrist, hand and finger(s), initial encounter: Secondary | ICD-10-CM | POA: Insufficient documentation
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
## 2015-05-10 NOTE — ED Notes (Signed)
Patient was upstairs visiting and patient and is currently back in waiting room to be triaged.

## 2015-05-10 NOTE — Discharge Instructions (Signed)
Please follow-up with your primary care provider for further evaluation and management of symptoms persist. Please use ice, Tylenol, ibuprofen as needed for pain.

## 2015-05-20 ENCOUNTER — Encounter (HOSPITAL_COMMUNITY): Payer: Self-pay

## 2015-05-20 ENCOUNTER — Emergency Department (HOSPITAL_COMMUNITY)
Admission: EM | Admit: 2015-05-20 | Discharge: 2015-05-20 | Disposition: A | Discharge status: Home / Self Care | Payer: Medicaid Other | Attending: Emergency Medicine | Admitting: Emergency Medicine

## 2015-05-20 DIAGNOSIS — Z7951 Long term (current) use of inhaled steroids: Secondary | ICD-10-CM | POA: Insufficient documentation

## 2015-05-20 DIAGNOSIS — Z8669 Personal history of other diseases of the nervous system and sense organs: Secondary | ICD-10-CM | POA: Insufficient documentation

## 2015-05-20 DIAGNOSIS — M1990 Unspecified osteoarthritis, unspecified site: Secondary | ICD-10-CM | POA: Insufficient documentation

## 2015-05-20 DIAGNOSIS — Z8673 Personal history of transient ischemic attack (TIA), and cerebral infarction without residual deficits: Secondary | ICD-10-CM | POA: Insufficient documentation

## 2015-05-20 DIAGNOSIS — Z79899 Other long term (current) drug therapy: Secondary | ICD-10-CM | POA: Insufficient documentation

## 2015-05-20 DIAGNOSIS — Z7982 Long term (current) use of aspirin: Secondary | ICD-10-CM | POA: Insufficient documentation

## 2015-05-20 DIAGNOSIS — Z87891 Personal history of nicotine dependence: Secondary | ICD-10-CM | POA: Insufficient documentation

## 2015-05-20 DIAGNOSIS — E1165 Type 2 diabetes mellitus with hyperglycemia: Secondary | ICD-10-CM | POA: Insufficient documentation

## 2015-05-20 DIAGNOSIS — J45909 Unspecified asthma, uncomplicated: Secondary | ICD-10-CM | POA: Insufficient documentation

## 2015-05-20 DIAGNOSIS — K219 Gastro-esophageal reflux disease without esophagitis: Secondary | ICD-10-CM | POA: Insufficient documentation

## 2015-05-20 DIAGNOSIS — M5417 Radiculopathy, lumbosacral region: Secondary | ICD-10-CM | POA: Insufficient documentation

## 2015-05-20 DIAGNOSIS — R739 Hyperglycemia, unspecified: Secondary | ICD-10-CM

## 2015-05-20 LAB — CBG MONITORING, ED: Glucose-Capillary: 233 mg/dL — ABNORMAL HIGH (ref 65–99)

## 2015-05-20 MED ORDER — HYDROCODONE-ACETAMINOPHEN 5-325 MG PO TABS
1.0000 | ORAL_TABLET | Freq: Once | ORAL | Status: AC
  Administered 2015-05-20: 1 via ORAL
  Filled 2015-05-20: qty 1

## 2015-05-20 MED ORDER — NAPROXEN 500 MG PO TABS: 500.0000 mg | ORAL_TABLET | Freq: Two times a day (BID) | ORAL | Status: DC

## 2015-05-20 MED ORDER — METHOCARBAMOL 500 MG PO TABS: 500.0000 mg | ORAL_TABLET | Freq: Two times a day (BID) | ORAL | Status: DC

## 2015-05-20 MED ORDER — PREDNISONE 20 MG PO TABS: 40.0000 mg | ORAL_TABLET | Freq: Every day | ORAL | Status: DC

## 2015-05-20 NOTE — Discharge Instructions (Signed)
Take naprosyn for pain and inflammation as prescribed. Robaxin for muscle spasms. Prednisone for flare up. Make sure to restart your metformin and watch your diet closely for the next 5 days while on prednisone. Restart lyrica. Follow up with a community clinic for further treatment. .   Lumbosacral Radiculopathy Lumbosacral radiculopathy is a pinched nerve or nerves in the low back (lumbosacral area). When this happens you may have weakness in your legs and may not be able to stand on your toes. You may have pain going down into your legs. There may be difficulties with walking normally. There are many causes of this problem. Sometimes this may happen from an injury, or simply from arthritis or boney problems. It may also be caused by other illnesses such as diabetes. If there is no improvement after treatment, further studies may be done to find the exact cause. DIAGNOSIS  X-rays may be needed if the problems become long standing. Electromyograms may be done. This study is one in which the working of nerves and muscles is studied. HOME CARE INSTRUCTIONS   Applications of ice packs may be helpful. Ice can be used in a plastic bag with a towel around it to prevent frostbite to skin. This may be used every 2 hours for 20 to 30 minutes, or as needed, while awake, or as directed by your caregiver.  Only take over-the-counter or prescription medicines for pain, discomfort, or fever as directed by your caregiver.  If physical therapy was prescribed, follow your caregiver's directions. SEEK IMMEDIATE MEDICAL CARE IF:   You have pain not controlled with medications.  You seem to be getting worse rather than better.  You develop increasing weakness in your legs.  You develop loss of bowel or bladder control.  You have difficulty with walking or balance, or develop clumsiness in the use of your legs.  You have a fever. MAKE SURE YOU:   Understand these instructions.  Will watch your  condition.  Will get help right away if you are not doing well or get worse. Document Released: 08/25/2005 Document Revised: 11/17/2011 Document Reviewed: 04/14/2008 [facility] Patient Information 2015 [place], [place]. This information is not intended to replace advice given to you by your health care provider. Make sure you discuss any questions you have with your health care provider.

## 2015-05-20 NOTE — ED Notes (Signed)
cbg 233 

## 2015-05-20 NOTE — ED Provider Notes (Signed)
CSN: [id number]     Arrival date & time 05/20/15  0944 History   First MD Initiated Contact with Patient 05/20/15 1033     Chief Complaint  Patient presents with  . Back Pain  . Sciatica     (Consider location/radiation/quality/duration/timing/severity/associated sxs/prior Treatment) HPI Cassie Mata is a 49 y.o. female with hx of DM, arthritis, CVA, asthma, myositis, fibromyalgia, presents to ED with complaint of lower back pain radiating down left leg. States pain has been there for "months" but got worse in the last several days. She stats she lost her medicaid and currently does not have PCP. Pt was treated for this with lyrica but states she no longer can afford it. She denies numbness or weakness in extremities. Denies any acute injuries. No problems controlling bowels or bladder. Pain is radiating from lower back into the lateral left leg all the way down to the ankle. States "i have sciatic nerve problem and herniated disk." Pt bought over the counter vitamin supplement online which she states she was told will help her with nerve pain and with her diabetes. She stopped taking all of her other medications including metformin. Denies fever or chills. No other complaints.   Past Medical History  Diagnosis Date  . Diabetes mellitus   . Arthritis   . Hiatal hernia   . Gall stone   . Stroke   . Coma   . Asthma   . Myalgia and myositis, unspecified 01/13/2013  . Gastroesophageal reflux disease   . Insomnia 08/07/2014  . Fibromyalgia    Past Surgical History  Procedure Laterality Date  . Cesarean section  [phone]   Family History  Problem Relation Age of Onset  . Mental illness Sister   . Diabetes Sister   . Mental illness Son     suicidal thoughts  . Diabetes Father   . Cancer Father    Social History  Substance Use Topics  . Smoking status: Former Smoker    Quit date: 08/03/1995  . Smokeless tobacco: Never Used  . Alcohol Use: No   OB History    No  data available     Review of Systems  Constitutional: Negative for fever and chills.  Respiratory: Negative for cough, chest tightness and shortness of breath.   Cardiovascular: Negative for chest pain, palpitations and leg swelling.  Gastrointestinal: Negative for nausea, vomiting, abdominal pain and diarrhea.  Musculoskeletal: Positive for back pain and arthralgias. Negative for myalgias, neck pain and neck stiffness.  Skin: Negative for rash.  Neurological: Negative for dizziness, weakness, numbness and headaches.  All other systems reviewed and are negative.     Allergies  Ivp dye; Lunesta; Other; Prednisone; and Ultracet  Home Medications   Prior to Admission medications   Medication Sig Start Date End Date Taking? Authorizing Provider  albuterol (PROVENTIL HFA;VENTOLIN HFA) 108 (90 BASE) MCG/ACT inhaler Inhale 2 puffs into the lungs every 6 (six) hours as needed. For shortness of breath    Historical Provider, MD  aspirin 81 MG tablet Take 81 mg by mouth daily.    Historical Provider, MD  cholecalciferol (VITAMIN D) 1000 UNITS tablet Take 2,000 Units by mouth at bedtime.     Historical Provider, MD  escitalopram (LEXAPRO) 10 MG tablet Take 10 mg by mouth daily.    Historical Provider, MD  Fluticasone-Salmeterol (ADVAIR) 250-50 MCG/DOSE AEPB Inhale 1 puff into the lungs 2 (two) times daily.    Historical Provider, MD  lisinopril (PRINIVIL,ZESTRIL) 10 MG tablet Take  10 mg by mouth daily.     Historical Provider, MD  metFORMIN (GLUCOPHAGE) 500 MG tablet Take 500 mg by mouth 2 (two) times daily.     Historical Provider, MD  metoprolol succinate (TOPROL-XL) 25 MG 24 hr tablet Take 25 mg by mouth daily.    Historical Provider, MD  Multiple Vitamin (MULTIVITAMIN) tablet Take 1 tablet by mouth daily.    Historical Provider, MD  nitroGLYCERIN (NITROSTAT) 0.4 MG SL tablet Place 0.4 mg under the tongue every 5 (five) minutes as needed for chest pain.    Historical Provider, MD   pantoprazole (PROTONIX) 40 MG tablet Take 40 mg by mouth daily.    Historical Provider, MD   BP 157/93 mmHg  Pulse 86  Temp(Src) 98 F (36.7 C) (Oral)  Resp 18  SpO2 100%  LMP 04/23/2015 Physical Exam  Constitutional: She is oriented to person, place, and time. She appears well-developed and well-nourished. No distress.  HENT:  Head: Normocephalic.  Eyes: Conjunctivae are normal.  Neck: Neck supple.  Cardiovascular: Normal rate, regular rhythm and normal heart sounds.   Pulmonary/Chest: Effort normal and breath sounds normal. No respiratory distress. She has no wheezes. She has no rales.  Abdominal: Soft. Bowel sounds are normal. She exhibits no distension. There is no tenderness. There is no rebound.  Musculoskeletal: She exhibits no edema.  Midline lumbar spine tenderness. Left perispinal lumbar tenderness extending into left buttock. Pain with left straight leg raise.   Neurological: She is alert and oriented to person, place, and time.  5/5 and equal lower extremity strength. 2+ and equal patellar reflexes bilaterally. Pt able to dorsiflex bilateral toes and feet with good strength against resistance. Equal sensation bilaterally over thighs and lower legs.   Skin: Skin is warm and dry.  Psychiatric: She has a normal mood and affect. Her behavior is normal.  Nursing note and vitals reviewed.   ED Course  Procedures (including critical care time) Labs Review Labs Reviewed  CBG MONITORING, ED - Abnormal; Notable for the following:    Glucose-Capillary 233 (*)    All other components within normal limits    Imaging Review No results found. I have personally reviewed and evaluated these images and lab results as part of my medical decision-making.   EKG Interpretation None      MDM   Final diagnoses:  Lumbosacral radiculopathy  Hyperglycemia    Pt here with acute on chronic radicular back pain. No evidence of cauda equina. Neurovascularly intact. Ambulatory. Will  give prednisone burst. Pt advised to get back on metforin and watch her diet closely while on prednisone. Restart lyrica. Home with robaxin, naprosyn. Follow up as needed with pcp.   Filed Vitals:   05/20/15 0952  BP: 157/93  Pulse: 86  Temp: 98 F (36.7 C)  TempSrc: Oral  Resp: 18  SpO2: 100%       Jaynie Crumble, PA-C 05/20/15 1430  Lavera Guise, MD 05/20/15 1907

## 2015-05-20 NOTE — ED Notes (Signed)
Pt c/o L side upper and lower back pain shooting down L leg x 2 months.  Pain score 10/10.  Pt has not taken anything for pain.  Pt reports using "Realtime" cream and sts it temporarily relieves pain.

## 2015-05-22 ENCOUNTER — Telehealth: Payer: Self-pay | Admitting: Internal Medicine

## 2015-05-22 NOTE — Telephone Encounter (Signed)
Patient was offered an appointment for next Tuesday. Patient stated that she needed an appointment sooner. Patient decided to return to another clinic.

## 2015-06-11 ENCOUNTER — Ambulatory Visit: Payer: Medicaid Other | Admitting: Nurse Practitioner

## 2015-06-15 ENCOUNTER — Ambulatory Visit (INDEPENDENT_AMBULATORY_CARE_PROVIDER_SITE_OTHER): Payer: Self-pay | Admitting: Licensed Clinical Social Worker

## 2015-06-15 DIAGNOSIS — F329 Major depressive disorder, single episode, unspecified: Secondary | ICD-10-CM

## 2015-06-15 DIAGNOSIS — F32A Depression, unspecified: Secondary | ICD-10-CM | POA: Insufficient documentation
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
## 2015-06-22 ENCOUNTER — Ambulatory Visit (INDEPENDENT_AMBULATORY_CARE_PROVIDER_SITE_OTHER): Payer: Self-pay | Admitting: Licensed Clinical Social Worker

## 2015-06-22 DIAGNOSIS — F331 Major depressive disorder, recurrent, moderate: Secondary | ICD-10-CM

## 2015-06-22 DIAGNOSIS — F4310 Post-traumatic stress disorder, unspecified: Secondary | ICD-10-CM

## 2015-06-22 NOTE — Progress Notes (Signed)
   THERAPY PROGRESS NOTE  Session Time: 60min Participation Level: Active  Behavioral Response: NeatAlertDepressed  Type of Therapy: Individual Therapy  Treatment Goals addressed: Coping  Interventions: Motivational Interviewing and Strength-based  Summary: Cassie Mata is a 49 y.o. female who presents with a depressed mood and appropriate affect. She reported that she continues to be severely stressed by her relationship with her on-again, off-again boyfriend. LCSW and [name] explored what her options for the relationship are, and how she can create boundaries that are healthy for her. She rated herself at a 7/8 out of 10 for how much she desires to get him out of her house. She reported that she was an 8 out of 10 for feeling motivated and ready to get him out of the house. She shared about continued ambivalence due to the resources that he does help with. [name] reported that her goals for counseling are to "have somebody to talk to" about difficult decisions, such as what to do about her relationship.   Suicidal/Homicidal: Nowithout intent/plan  Therapist Response: LCSW completed Missie's Comprehensive Clinical Assessment during this visit. LCSW gathered information about developmental history, substance use, and completed the risk assessment. LCSW used Motivational Interviewing techniques such as reflections and scaling questions in order to determine Meagon's motivation around making a decision about her relationship. LCSW engaged [name] in treatment planning in order to develop goals for ongoing counseling.  Plan: Return again in 1 weeks.  Diagnosis: Axis I: Major Depression, Recurrent severe and Post Traumatic Stress Disorder    Axis II: No diagnosis    Nilda Simmeratosha Cameron Schwinn, LCSW 06/22/2015

## 2015-06-29 ENCOUNTER — Other Ambulatory Visit: Payer: No Typology Code available for payment source | Admitting: Licensed Clinical Social Worker

## 2015-07-05 ENCOUNTER — Ambulatory Visit (INDEPENDENT_AMBULATORY_CARE_PROVIDER_SITE_OTHER): Payer: Self-pay | Admitting: Licensed Clinical Social Worker

## 2015-07-05 DIAGNOSIS — F4310 Post-traumatic stress disorder, unspecified: Secondary | ICD-10-CM

## 2015-07-05 DIAGNOSIS — F331 Major depressive disorder, recurrent, moderate: Secondary | ICD-10-CM
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
## 2015-07-11 ENCOUNTER — Telehealth: Payer: Self-pay | Admitting: Internal Medicine

## 2015-07-11 NOTE — Telephone Encounter (Signed)
Wants to speak to Dr. Delrae AlfredMulberry about a personal matter.  Please call [phone]

## 2015-07-13 ENCOUNTER — Other Ambulatory Visit: Payer: No Typology Code available for payment source | Admitting: Licensed Clinical Social Worker

## 2015-07-17 NOTE — Telephone Encounter (Signed)
Please call patient and see if you can get more information.

## 2015-07-17 NOTE — Telephone Encounter (Signed)
Spoke with the Patient she was having concerns about possibly having a yeast infection since the message was from last week she will try Monistat because that has worked in the past for her. Her second concern is she spoke to you about being being in the early stage of menopause, she is having a lot of vaginal dryness when she has sexual intercourse what can she use for this

## 2015-07-20 ENCOUNTER — Other Ambulatory Visit: Payer: No Typology Code available for payment source | Admitting: Licensed Clinical Social Worker

## 2015-07-23 ENCOUNTER — Other Ambulatory Visit: Payer: No Typology Code available for payment source | Admitting: Licensed Clinical Social Worker

## 2015-07-30 ENCOUNTER — Encounter (INDEPENDENT_AMBULATORY_CARE_PROVIDER_SITE_OTHER): Payer: Self-pay

## 2015-07-30 ENCOUNTER — Ambulatory Visit (INDEPENDENT_AMBULATORY_CARE_PROVIDER_SITE_OTHER): Payer: Self-pay | Admitting: Licensed Clinical Social Worker

## 2015-07-30 DIAGNOSIS — F4310 Post-traumatic stress disorder, unspecified: Secondary | ICD-10-CM

## 2015-07-30 DIAGNOSIS — F331 Major depressive disorder, recurrent, moderate: Secondary | ICD-10-CM
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
## 2015-07-30 NOTE — Telephone Encounter (Signed)
Called and left message on her voicemail when we will be open this week to let us know when I might be able to speak with her.

## 2015-08-01 ENCOUNTER — Ambulatory Visit (INDEPENDENT_AMBULATORY_CARE_PROVIDER_SITE_OTHER): Payer: Self-pay | Admitting: Internal Medicine

## 2015-08-01 VITALS — BP 138/80 | HR 76 | Resp 20 | Ht 61.0 in | Wt 160.0 lb

## 2015-08-01 DIAGNOSIS — E118 Type 2 diabetes mellitus with unspecified complications: Secondary | ICD-10-CM

## 2015-08-01 DIAGNOSIS — I10 Essential (primary) hypertension: Secondary | ICD-10-CM

## 2015-08-01 DIAGNOSIS — K219 Gastro-esophageal reflux disease without esophagitis: Secondary | ICD-10-CM

## 2015-08-01 DIAGNOSIS — E1165 Type 2 diabetes mellitus with hyperglycemia: Secondary | ICD-10-CM

## 2015-08-01 DIAGNOSIS — M5442 Lumbago with sciatica, left side: Secondary | ICD-10-CM

## 2015-08-01 DIAGNOSIS — M1990 Unspecified osteoarthritis, unspecified site: Secondary | ICD-10-CM

## 2015-08-01 DIAGNOSIS — G8929 Other chronic pain: Secondary | ICD-10-CM

## 2015-08-01 DIAGNOSIS — IMO0002 Reserved for concepts with insufficient information to code with codable children: Secondary | ICD-10-CM

## 2015-08-01 NOTE — Progress Notes (Signed)
   Subjective:    Patient ID: Cassie Mata, female    DOB: 09/08/1966, 49 y.o.   MRN: [id number]  HPI  1.  Vaginal Dryness:  Has noted more so in last 2 years.  Only bothers her with intercourse.  Has tried Ky jelly, but not much success.    2.  Stomach issues:  Hx of GERD.  Depends on what she eats.  3 times weekly has symptoms.  If lies down after eating pizza, particularly bad.    3.  Chronic back pain/insomnia due to pain/fibromyalgia:  Pt. States Lunesta helped, but dose of 300 mg was too strong--made her "dopey".   Would be willing to try just 100 mg.    4.  DM/Htn:  Pt. Taking meds intermittently.  Not clear what she was definitively taking prior to establishing with us a couple of months ago.  Discussed some of meds on med list in Epic.  Metformin, Lisinopril, pt. Also reportedly on Metroprolol, long acting in past.  She states she has been utilizing intermittently.      Review of Systems     Objective:   Physical Exam Lungs:  CTA CV:  RRR without murmur or rub, radial and DP pulses normal and equal Abd:  S, NT, No HSM or masses appreciated.       Assessment & Plan:  1.  VAginal Dryness:  Discussed not a good candidate for hormone replacement with cardiovascular risk factors.  Pt. Also noncompliant with meds.   Recommended trying Astroglide with intercourse or Vagisil Silk. Have had previous discussions about this.  Wrote these options out for her.  2.  GERD:  Elevated HOB --pt. States she does not feel she can do this from below bed, but will get a wedge cushion.  No lying down after eating for 2 hours. Discussed staying away from tomatoes, onions, caffeine, chocolate.  Pt. Does not smoke nor drink.  Not clear if she is utilizing PPI regularly as well.  3.  Chronic pain:  Lyrica 100 mg at bedtime--will [facility].  Did not address Methocarbamol today.  4.  DM/Htn:  Noncompliance.  Strongly urged pt. To get started on meds.  Will send into  [facility] fro $4 cost.  Metoprolol 25 mg half tab twice daily, Lisinopril 5 mg , half tab daily and Metformin 500 mg twice daily.  Work on diet and physical activity.

## 2015-08-01 NOTE — Patient Instructions (Signed)
Drink a glass of water before every meal Drink 6-8 glasses of water daily Eat three meals daily Eat a protein and healthy fat with every meal (eggs,fish, chicken, turkey and limit red meats) Eat 5 servings of vegetables daily, mix the colors Eat 2 servings of fruit daily with skin, if skin is edible Use smaller plates Put food/utensils down as you chew and swallow each bite Eat at a table with friends/family at least once daily, no TV Do not eat in front of the TV 

## 2015-08-06 ENCOUNTER — Ambulatory Visit (INDEPENDENT_AMBULATORY_CARE_PROVIDER_SITE_OTHER): Payer: Self-pay | Admitting: Licensed Clinical Social Worker

## 2015-08-06 DIAGNOSIS — F331 Major depressive disorder, recurrent, moderate: Secondary | ICD-10-CM

## 2015-08-06 DIAGNOSIS — F4310 Post-traumatic stress disorder, unspecified: Secondary | ICD-10-CM
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
## 2015-08-06 NOTE — Telephone Encounter (Signed)
Patient called to inquire about Rx that was to be called in for her.  [facility] [address]  Patient can be reached at [phone]. Rx in question are lisinopril, metFORMIN 500 mg. Tablet 2x's per day and Toprol-XL.  Patient can be reached at [phone] with questions regarding medication.

## 2015-08-07 ENCOUNTER — Ambulatory Visit: Payer: No Typology Code available for payment source | Admitting: Internal Medicine

## 2015-08-07 MED ORDER — GLUCOSE BLOOD VI STRP: ORAL_STRIP | Status: DC

## 2015-08-07 MED ORDER — METFORMIN HCL 500 MG PO TABS: 500.0000 mg | ORAL_TABLET | Freq: Two times a day (BID) | ORAL | Status: DC

## 2015-08-07 MED ORDER — BLOOD GLUCOSE MONITOR KIT: PACK | Status: DC

## 2015-08-07 MED ORDER — PREGABALIN 100 MG PO CAPS: 100.0000 mg | ORAL_CAPSULE | Freq: Every day | ORAL | Status: DC

## 2015-08-07 MED ORDER — LISINOPRIL 5 MG PO TABS: ORAL_TABLET | ORAL | Status: DC

## 2015-08-07 MED ORDER — METOPROLOL TARTRATE 25 MG PO TABS: ORAL_TABLET | ORAL | Status: DC

## 2015-08-08 ENCOUNTER — Encounter: Payer: Self-pay | Admitting: Internal Medicine

## 2015-08-08 DIAGNOSIS — K219 Gastro-esophageal reflux disease without esophagitis: Secondary | ICD-10-CM | POA: Insufficient documentation

## 2015-08-08 DIAGNOSIS — IMO0002 Reserved for concepts with insufficient information to code with codable children: Secondary | ICD-10-CM | POA: Insufficient documentation

## 2015-08-08 DIAGNOSIS — G8929 Other chronic pain: Secondary | ICD-10-CM | POA: Insufficient documentation

## 2015-08-08 DIAGNOSIS — E119 Type 2 diabetes mellitus without complications: Secondary | ICD-10-CM | POA: Insufficient documentation

## 2015-08-08 DIAGNOSIS — M5440 Lumbago with sciatica, unspecified side: Secondary | ICD-10-CM

## 2015-08-08 DIAGNOSIS — E1165 Type 2 diabetes mellitus with hyperglycemia: Secondary | ICD-10-CM | POA: Insufficient documentation

## 2015-08-08 DIAGNOSIS — M1990 Unspecified osteoarthritis, unspecified site: Secondary | ICD-10-CM | POA: Insufficient documentation

## 2015-08-08 DIAGNOSIS — I10 Essential (primary) hypertension: Secondary | ICD-10-CM | POA: Insufficient documentation

## 2015-08-13 ENCOUNTER — Other Ambulatory Visit: Payer: No Typology Code available for payment source | Admitting: Licensed Clinical Social Worker

## 2015-08-15 ENCOUNTER — Other Ambulatory Visit: Payer: No Typology Code available for payment source

## 2015-08-15 NOTE — Telephone Encounter (Signed)
Those meds were sent in.  Please check to make sure she has them now. Also, the Lyrica is not available at [facility]--but she can apply for it if fills out the financial paperwork and sends into pharmaceutical company--may be able to get it free for a year. Does she want to do that?

## 2015-08-16 NOTE — Telephone Encounter (Signed)
Patient does not have medications yet. States no one called her to let her know after she called to inquire about it.  Patient will go get medications today. Patient would like to fill out the application for Lyrica to be able to get it free for a year

## 2015-08-16 NOTE — Telephone Encounter (Signed)
Yes, I thought I had sent back documentation to staff to call, but cannot find that I actually did  do that and I apologize (please call and share with her) I will print up the application for the Lyrica for her to pick up.   Once she is done, to bring back and I will write the prescription and send in for her. Application should be up front for her to pick up

## 2015-08-17 ENCOUNTER — Other Ambulatory Visit: Payer: No Typology Code available for payment source | Admitting: Licensed Clinical Social Worker

## 2015-08-17 NOTE — Telephone Encounter (Signed)
Patient contacted Patient to come in Monday 08/20/15 to pick up documentation

## 2015-08-20 ENCOUNTER — Ambulatory Visit (INDEPENDENT_AMBULATORY_CARE_PROVIDER_SITE_OTHER): Payer: Self-pay | Admitting: Licensed Clinical Social Worker

## 2015-08-20 DIAGNOSIS — F4310 Post-traumatic stress disorder, unspecified: Secondary | ICD-10-CM

## 2015-08-20 DIAGNOSIS — F331 Major depressive disorder, recurrent, moderate: Secondary | ICD-10-CM
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
## 2015-08-27 ENCOUNTER — Other Ambulatory Visit: Payer: No Typology Code available for payment source | Admitting: Licensed Clinical Social Worker

## 2015-08-29 ENCOUNTER — Ambulatory Visit: Payer: No Typology Code available for payment source | Admitting: Internal Medicine

## 2015-09-12 ENCOUNTER — Ambulatory Visit (INDEPENDENT_AMBULATORY_CARE_PROVIDER_SITE_OTHER): Payer: Self-pay | Admitting: Licensed Clinical Social Worker

## 2015-09-12 DIAGNOSIS — F331 Major depressive disorder, recurrent, moderate: Secondary | ICD-10-CM

## 2015-09-12 DIAGNOSIS — F4310 Post-traumatic stress disorder, unspecified: Secondary | ICD-10-CM

## 2015-09-13 ENCOUNTER — Encounter: Payer: Self-pay | Admitting: Internal Medicine

## 2015-09-13 ENCOUNTER — Ambulatory Visit (INDEPENDENT_AMBULATORY_CARE_PROVIDER_SITE_OTHER): Payer: Self-pay | Admitting: Internal Medicine

## 2015-09-13 VITALS — BP 140/88 | HR 90 | Temp 98.7°F | Ht 61.0 in | Wt 155.0 lb

## 2015-09-13 DIAGNOSIS — F329 Major depressive disorder, single episode, unspecified: Secondary | ICD-10-CM

## 2015-09-13 DIAGNOSIS — H6691 Otitis media, unspecified, right ear: Secondary | ICD-10-CM

## 2015-09-13 DIAGNOSIS — J45901 Unspecified asthma with (acute) exacerbation: Secondary | ICD-10-CM

## 2015-09-13 DIAGNOSIS — F32A Depression, unspecified: Secondary | ICD-10-CM

## 2015-09-13 MED ORDER — FLUTICASONE-SALMETEROL 250-50 MCG/DOSE IN AEPB: 1.0000 | INHALATION_SPRAY | Freq: Two times a day (BID) | RESPIRATORY_TRACT | Status: DC

## 2015-09-13 MED ORDER — AZITHROMYCIN 250 MG PO TABS: ORAL_TABLET | ORAL | Status: DC

## 2015-09-13 MED ORDER — ALBUTEROL SULFATE HFA 108 (90 BASE) MCG/ACT IN AERS: 2.0000 | INHALATION_SPRAY | Freq: Four times a day (QID) | RESPIRATORY_TRACT | Status: DC | PRN

## 2015-09-13 MED ORDER — ESCITALOPRAM OXALATE 10 MG PO TABS: 10.0000 mg | ORAL_TABLET | Freq: Every day | ORAL | Status: DC

## 2015-09-13 NOTE — Progress Notes (Signed)
   Subjective:    Patient ID: Cassie Mata, female    DOB: [birth year], 50 y.o.   MRN: [id number]  HPI  Cough and fever started 2-3 days ago along with headache.  Last 2 days developed right ear pain.  Placed castor oil in right ear and eased pain.   Has been using Triaminic OTC liquid without help.   Had some PHenergan with codeine and feels that helped her break up the mucous so she could cough it up.  Brown mucous in the morning when first gets up, clears throughout the day. Lot of mucous in throat.  THroat hurts really only after cough.     No fever since the first day.  No headache now. Is out of albuterol.  Chest feels tight after coughing.  Out of Albuterol as well.   DM:  Not taking care of DM.  Not sure where she put her new glucometer--but will look for it.  Taking Metformin.  Depression:  Ran out of Escitalopram 10 mg some time ago.  MIssed follow up to get her back on meds.  Will restart.    Review of Systems     Objective:   Physical Exam  Congested cough HEENT:  PERRL, EOMI, conjunctivae without injection, Right TM injected and dull.  Small abrasion on back wall of external canal(pt. Used qtip to scratch)  Left TM pearly gray, posterior pharynx with mild injection, no exudate.  NT over sinuses Neck:  Supple, no adenopathy Chest: CTA, with good air movement and no wheeze CV:  RRR without murmur or rub, radial pulses normal and equal.         Assessment & Plan:  1. URI with right OM:  Z pak, cool mist humidifier, drink lots of water.  To get capsules or sugar free otc cold remedy, such as Dayquil/Nyquil combination and cut back on recommended Nyquil dose to half if hangover of sleepiness in morning. Follow BP and stop decongestant if elevated Call if worsens  2.  Depression: Restart Escitalopram 10 mg daily.  Continues with counseling with Nilda SimmerNatosha Knight  3.  DM:  Discussed getting Depression/anxiety symptoms under control so can get organized with control of  other conditions.  Follow up in 1 month
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
## 2015-09-13 NOTE — Patient Instructions (Signed)
Recommend Dayquil/Nyquil combo--sugar free Drink lots of water

## 2015-09-27 ENCOUNTER — Other Ambulatory Visit: Payer: Self-pay

## 2015-09-27 DIAGNOSIS — Z1231 Encounter for screening mammogram for malignant neoplasm of breast: Secondary | ICD-10-CM

## 2015-10-01 ENCOUNTER — Other Ambulatory Visit: Payer: No Typology Code available for payment source | Admitting: Licensed Clinical Social Worker

## 2015-10-15 ENCOUNTER — Ambulatory Visit: Payer: No Typology Code available for payment source | Admitting: Internal Medicine

## 2015-11-05 ENCOUNTER — Ambulatory Visit: Payer: Medicaid Other

## 2016-07-14 ENCOUNTER — Encounter (HOSPITAL_COMMUNITY): Payer: Self-pay

## 2016-07-14 ENCOUNTER — Emergency Department (HOSPITAL_COMMUNITY)
Admission: EM | Admit: 2016-07-14 | Discharge: 2016-07-14 | Disposition: A | Discharge status: Home / Self Care | Payer: Medicaid Other | Attending: Emergency Medicine | Admitting: Emergency Medicine

## 2016-07-14 DIAGNOSIS — Z8673 Personal history of transient ischemic attack (TIA), and cerebral infarction without residual deficits: Secondary | ICD-10-CM | POA: Insufficient documentation

## 2016-07-14 DIAGNOSIS — J45909 Unspecified asthma, uncomplicated: Secondary | ICD-10-CM | POA: Insufficient documentation

## 2016-07-14 DIAGNOSIS — E119 Type 2 diabetes mellitus without complications: Secondary | ICD-10-CM | POA: Insufficient documentation

## 2016-07-14 DIAGNOSIS — Z7982 Long term (current) use of aspirin: Secondary | ICD-10-CM | POA: Insufficient documentation

## 2016-07-14 DIAGNOSIS — R42 Dizziness and giddiness: Secondary | ICD-10-CM | POA: Insufficient documentation

## 2016-07-14 DIAGNOSIS — Z7984 Long term (current) use of oral hypoglycemic drugs: Secondary | ICD-10-CM | POA: Insufficient documentation

## 2016-07-14 DIAGNOSIS — Z87891 Personal history of nicotine dependence: Secondary | ICD-10-CM | POA: Insufficient documentation

## 2016-07-14 DIAGNOSIS — Z79899 Other long term (current) drug therapy: Secondary | ICD-10-CM | POA: Insufficient documentation

## 2016-07-14 LAB — CARBOXYHEMOGLOBIN - COOX: Carboxyhemoglobin: 0.7 % (ref 0.5–1.5)

## 2016-07-14 MED ORDER — ALBUTEROL SULFATE HFA 108 (90 BASE) MCG/ACT IN AERS: 1.0000 | INHALATION_SPRAY | Freq: Four times a day (QID) | RESPIRATORY_TRACT | 0 refills | Status: DC | PRN

## 2016-07-14 MED ORDER — ALBUTEROL SULFATE HFA 108 (90 BASE) MCG/ACT IN AERS
2.0000 | INHALATION_SPRAY | Freq: Once | RESPIRATORY_TRACT | Status: AC
  Administered 2016-07-14: 2 via RESPIRATORY_TRACT
  Filled 2016-07-14: qty 6.7

## 2016-07-14 MED ORDER — AEROCHAMBER PLUS W/MASK MISC
Status: AC
  Filled 2016-07-14: qty 1

## 2016-07-14 MED ORDER — AEROCHAMBER PLUS FLO-VU MEDIUM MISC
1.0000 | Freq: Once | Status: AC
  Administered 2016-07-14: 1
  Filled 2016-07-14: qty 1

## 2016-07-14 NOTE — ED Notes (Signed)
Denies questions or needs, VSS, "feel better", given inhaler with spacer and Rx x1, "ready to go".

## 2016-07-14 NOTE — ED Notes (Signed)
Placed pt on 4 LPM via [place] per EDP

## 2016-07-14 NOTE — ED Triage Notes (Signed)
Pt complaining of breathing problems. Pt states problems with furnace, wants to check for carbon monoxide poisoning. Pt states hx of asthma and bronchitis.

## 2016-07-14 NOTE — Discharge Instructions (Signed)
Normal levels were found in your blood testing. No carbon monoxide poisoning.  Do not restart your furnace until it is evaluated by licensed professional.  Albuterol as needed for asthma or bronchitis.

## 2016-07-14 NOTE — ED Provider Notes (Addendum)
[facility] DEPT Provider Note   CSN: [id number] Arrival date & time: 07/14/16  1820     History   Chief Complaint Chief Complaint  Patient presents with  . Shortness of Breath    HPI Cassie Mata is a 50 y.o. female. She presents for evaluation of concern over possible carbon monoxide.  He states that she has not had her gas or her furnace turned on over the last few years and simply has been using electrical heat. She lives in a rental home. She states her gas was turned on last week in her heater came on the last 2 days. She states that she lost her voice and developed little bit of cough. Also she felt a little bit dizzy today as well. She has been out of the house since this morning and her symptoms are improving. Her daughter-in-law was at the household and complained of nausea. She states that someone from [employer] came to the house and checked. She states she was told it was "something wrong with the heat". She states the gas was turned off. They just did turn off the left ankle supply to the furnace.  HPI  Past Medical History:  Diagnosis Date  . Arthritis   . Asthma   . Coma ([place])   . Diabetes mellitus   . Fibromyalgia   . Gall stone   . Gastroesophageal reflux disease   . Hiatal hernia   . Insomnia 08/07/2014  . Myalgia and myositis, unspecified 01/13/2013  . Stroke [facility]) 2007   Pt. evaluated at [facility] had left sided weakness.  Reportedly no cerebral imaging ever done.  Records we have received from [facility] dated 2011 do not document this.  Rest of records in storage and have not yet sent.    Patient Active Problem List   Diagnosis Date Noted  . Diabetes mellitus type II, uncontrolled ([place]) 08/08/2015  . Essential hypertension 08/08/2015  . GERD (gastroesophageal reflux disease) 08/08/2015  . 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 Arthritis 08/08/2015  . Depression 06/15/2015  . Insomnia 08/07/2014  . Myalgia  and myositis 01/13/2013    Past Surgical History:  Procedure Laterality Date  . CESAREAN SECTION  1987,1988,1996,2006    OB History    No data available       Home Medications    Prior to Admission medications   Medication Sig Start Date End Date Taking? Authorizing Provider  albuterol (PROVENTIL HFA;VENTOLIN HFA) 108 (90 Base) MCG/ACT inhaler Inhale 1-2 puffs into the lungs every 6 (six) hours as needed for wheezing. 07/14/16   Tanna Furry, MD  aspirin 81 MG tablet Take 81 mg by mouth daily.    Historical Provider, MD  azithromycin (ZITHROMAX) 250 MG tablet 2 tabs by mouth today, then 1 tab by mouth daily for 4 more days. 09/13/15   Mack Hook, MD  blood glucose meter kit and supplies KIT Dispense based on patient and insurance preference. Use up to four times daily as directed. (FOR ICD-9 250.00, 250.01). 08/07/15   Mack Hook, MD  cholecalciferol (VITAMIN D) 1000 UNITS tablet Take 2,000 Units by mouth at bedtime.     Historical Provider, MD  escitalopram (LEXAPRO) 10 MG tablet Take 1 tablet (10 mg total) by mouth daily. Reported on 09/13/2015 09/13/15   Mack Hook, MD  Fluticasone-Salmeterol (ADVAIR) 250-50 MCG/DOSE AEPB Inhale 1 puff into the lungs 2 (two) times daily. Reported on 09/13/2015 09/13/15   Mack Hook, MD  glucose  blood test strip Twice daily blood glucose checks as instructed 08/07/15   Mack Hook, MD  lisinopril (PRINIVIL,ZESTRIL) 5 MG tablet 1/2 tab by mouth daily 08/07/15   Mack Hook, MD  metFORMIN (GLUCOPHAGE) 500 MG tablet Take 1 tablet (500 mg total) by mouth 2 (two) times daily. 08/07/15   Mack Hook, MD  metoprolol tartrate (LOPRESSOR) 25 MG tablet 1/2 tab by mouth twice daily 08/07/15   Mack Hook, MD  Multiple Vitamin (MULTIVITAMIN) tablet Take 1 tablet by mouth daily.    Historical Provider, MD  nitroGLYCERIN (NITROSTAT) 0.4 MG SL tablet Place 0.4 mg under the tongue every 5 (five) minutes as needed for  chest pain.    Historical Provider, MD  pantoprazole (PROTONIX) 40 MG tablet Take 40 mg by mouth daily. Reported on 09/13/2015    Historical Provider, MD  pregabalin (LYRICA) 100 MG capsule Take 1 capsule (100 mg total) by mouth daily. 08/07/15   Mack Hook, MD    Family History Family History  Problem Relation Age of Onset  . Diabetes Sister   . Alcohol abuse Sister   . Cirrhosis Sister   . Mental illness Son     suicidal thoughts  . ADD / ADHD Son   . Diabetes Father   . Cancer Father     Brain  . Cirrhosis Mother     alcoholic  . Mental illness Brother   . Arthritis Sister   . Cancer Sister     ?possible gastric cancer  . ADD / ADHD Son   . ADD / ADHD Son   . Mental illness Son     schizophrenia  . Blindness Son     Legal blindness    Social History Social History  Substance Use Topics  . Smoking status: Former Smoker    Years: 17.00    Types: Cigarettes    Quit date: 08/03/1995  . Smokeless tobacco: Never Used  . Alcohol use No     Allergies   Ivp dye [iodinated diagnostic agents]; Lunesta [eszopiclone]; Other; Prednisone; and Ultracet [tramadol-acetaminophen]   Review of Systems Review of Systems  Constitutional: Negative for appetite change, chills, diaphoresis, fatigue and fever.  HENT: Negative for mouth sores, sore throat and trouble swallowing.   Eyes: Negative for visual disturbance.  Respiratory: Positive for cough. Negative for chest tightness, shortness of breath and wheezing.   Cardiovascular: Negative for chest pain.  Gastrointestinal: Negative for abdominal distention, abdominal pain, diarrhea, nausea and vomiting.  Endocrine: Negative for polydipsia, polyphagia and polyuria.  Genitourinary: Negative for dysuria, frequency and hematuria.  Musculoskeletal: Negative for gait problem.  Skin: Negative for color change, pallor and rash.  Neurological: Positive for dizziness. Negative for syncope, light-headedness and headaches.    Hematological: Does not bruise/bleed easily.  Psychiatric/Behavioral: Negative for behavioral problems and confusion.     Physical Exam Updated Vital Signs BP 125/92 (BP Location: Right Arm)   Pulse 103   Temp 98.6 F (37 C) (Oral)   Resp 20   SpO2 99%   Physical Exam  Constitutional: She is oriented to person, place, and time. She appears well-developed and well-nourished. No distress.  HENT:  Head: Normocephalic.  Eyes: Conjunctivae are normal. Pupils are equal, round, and reactive to light. No scleral icterus.  Neck: Normal range of motion. Neck supple. No thyromegaly present.  Cardiovascular: Normal rate and regular rhythm.  Exam reveals no gallop and no friction rub.   No murmur heard. Bilateral breath sounds. No wheezing rales rhonchi or prolongation  Pulmonary/Chest: Effort normal and breath sounds normal. No respiratory distress. She has no wheezes. She has no rales.  Abdominal: Soft. Bowel sounds are normal. She exhibits no distension. There is no tenderness. There is no rebound.  Musculoskeletal: Normal range of motion.  Neurological: She is alert and oriented to person, place, and time.  Skin: Skin is warm and dry. No rash noted.  Psychiatric: She has a normal mood and affect. Her behavior is normal.     ED Treatments / Results  Labs (all labs ordered are listed, but only abnormal results are displayed) [place]    EKG  EKG Interpretation None       Radiology No results found.  Procedures Procedures (including critical care time)  Medications Ordered in ED Medications  albuterol (PROVENTIL HFA;VENTOLIN HFA) 108 (90 Base) MCG/ACT inhaler 2 puff (2 puffs Inhalation Given 07/14/16 2017)  AEROCHAMBER PLUS FLO-VU MEDIUM MISC 1 each (1 each Other Given 07/14/16 2018)     Initial Impression / Assessment and Plan / ED Course  I have reviewed the triage vital signs and the nursing notes.  Pertinent labs & imaging results  that were available during my care of the patient were reviewed by me and considered in my medical decision making (see chart for details).  Clinical Course     Based on O2 pending carboxyhemoglobin level.  Final Clinical Impressions(s) / ED Diagnoses   Final diagnoses:  Dizziness    New Prescriptions New Prescriptions   ALBUTEROL (PROVENTIL HFA;VENTOLIN HFA) 108 (90 BASE) MCG/ACT INHALER    Inhale 1-2 puffs into the lungs every 6 (six) hours as needed for wheezing.     Tanna Furry, MD 07/14/16 2039    Tanna Furry, MD 07/14/16 2059

## 2016-07-14 NOTE — ED Notes (Signed)
Pt alert, NAD, calm, interactive,resps e/u, no dyspnea noted, reports vague sx of chronic bronchitis, usual fall sx, recent gas odor in the house, mentions recent intermitant cough, sob, light headedness, denies sx at this time.

## 2017-10-06 ENCOUNTER — Encounter (HOSPITAL_COMMUNITY): Payer: Self-pay

## 2017-10-06 ENCOUNTER — Emergency Department (HOSPITAL_COMMUNITY)
Admission: EM | Admit: 2017-10-06 | Discharge: 2017-10-06 | Disposition: A | Discharge status: Home / Self Care | Payer: Self-pay | Attending: Emergency Medicine | Admitting: Emergency Medicine

## 2017-10-06 DIAGNOSIS — Z87891 Personal history of nicotine dependence: Secondary | ICD-10-CM | POA: Insufficient documentation

## 2017-10-06 DIAGNOSIS — Z7982 Long term (current) use of aspirin: Secondary | ICD-10-CM | POA: Insufficient documentation

## 2017-10-06 DIAGNOSIS — M545 Low back pain, unspecified: Secondary | ICD-10-CM

## 2017-10-06 DIAGNOSIS — Z79899 Other long term (current) drug therapy: Secondary | ICD-10-CM | POA: Insufficient documentation

## 2017-10-06 DIAGNOSIS — I10 Essential (primary) hypertension: Secondary | ICD-10-CM | POA: Insufficient documentation

## 2017-10-06 DIAGNOSIS — E119 Type 2 diabetes mellitus without complications: Secondary | ICD-10-CM | POA: Insufficient documentation

## 2017-10-06 DIAGNOSIS — J45909 Unspecified asthma, uncomplicated: Secondary | ICD-10-CM | POA: Insufficient documentation

## 2017-10-06 DIAGNOSIS — Z8673 Personal history of transient ischemic attack (TIA), and cerebral infarction without residual deficits: Secondary | ICD-10-CM | POA: Insufficient documentation

## 2017-10-06 DIAGNOSIS — Z7984 Long term (current) use of oral hypoglycemic drugs: Secondary | ICD-10-CM | POA: Insufficient documentation

## 2017-10-06 MED ORDER — NAPROXEN 500 MG PO TABS: 500.0000 mg | ORAL_TABLET | Freq: Two times a day (BID) | ORAL | 0 refills | Status: DC

## 2017-10-06 MED ORDER — CYCLOBENZAPRINE HCL 5 MG PO TABS: 5.0000 mg | ORAL_TABLET | Freq: Every evening | ORAL | 0 refills | Status: DC | PRN

## 2017-10-06 MED ORDER — NAPROXEN 250 MG PO TABS
500.0000 mg | ORAL_TABLET | Freq: Once | ORAL | Status: AC
  Administered 2017-10-06: 500 mg via ORAL
  Filled 2017-10-06: qty 2

## 2017-10-06 MED ORDER — CYCLOBENZAPRINE HCL 10 MG PO TABS
5.0000 mg | ORAL_TABLET | Freq: Once | ORAL | Status: AC
  Administered 2017-10-06: 5 mg via ORAL
  Filled 2017-10-06: qty 1
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
## 2017-10-06 NOTE — Discharge Instructions (Signed)
Take naproxen 2 times a day with meals.  Do not take other anti-inflammatories at the same time open (Advil, Motrin, ibuprofen, Aleve). You may supplement with Tylenol if you need further pain control. °Use Flexeril as needed for muscle stiffness or soreness.  Have caution, this may make you tired or groggy.  Do not drive or operate heavy machinery while taking this medicine. °Use ice packs or heating pads if this helps control your pain. °You likely have continued muscle stiffness and soreness over the next couple days.  Follow-up with primary care in 1 week if your symptoms are not improving. °Return to the emergency room if you develop vision changes, vomiting, slurred speech, numbness, loss of bowel or bladder control, or any new or worsening symptoms. ° °

## 2017-10-06 NOTE — ED Notes (Signed)
Provided graham crackers and water. Also provided some heat packs for pain.
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
## 2017-10-06 NOTE — ED Notes (Signed)
Patient verbalized understanding of discharge instructions and denies any further needs or questions at this time. VS stable. Patient ambulatory with steady gait. Escorted to vehicle in wheelchair.

## 2017-10-20 ENCOUNTER — Ambulatory Visit: Payer: Self-pay | Admitting: Internal Medicine

## 2017-10-20 ENCOUNTER — Encounter: Payer: Self-pay | Admitting: Internal Medicine

## 2017-10-20 VITALS — BP 152/110 | HR 108 | Resp 12 | Ht 61.25 in | Wt 149.0 lb

## 2017-10-20 DIAGNOSIS — F32A Depression, unspecified: Secondary | ICD-10-CM

## 2017-10-20 DIAGNOSIS — F329 Major depressive disorder, single episode, unspecified: Secondary | ICD-10-CM

## 2017-10-20 DIAGNOSIS — K219 Gastro-esophageal reflux disease without esophagitis: Secondary | ICD-10-CM

## 2017-10-20 DIAGNOSIS — J45909 Unspecified asthma, uncomplicated: Secondary | ICD-10-CM

## 2017-10-20 DIAGNOSIS — Z79899 Other long term (current) drug therapy: Secondary | ICD-10-CM

## 2017-10-20 DIAGNOSIS — E1165 Type 2 diabetes mellitus with hyperglycemia: Secondary | ICD-10-CM

## 2017-10-20 DIAGNOSIS — E119 Type 2 diabetes mellitus without complications: Secondary | ICD-10-CM

## 2017-10-20 DIAGNOSIS — I10 Essential (primary) hypertension: Secondary | ICD-10-CM

## 2017-10-20 LAB — GLUCOSE, POCT (MANUAL RESULT ENTRY): POC GLUCOSE: 331 mg/dL — AB (ref 70–99)

## 2017-10-20 MED ORDER — LISINOPRIL 5 MG PO TABS: ORAL_TABLET | ORAL | 11 refills | Status: DC

## 2017-10-20 MED ORDER — GLIPIZIDE 5 MG PO TABS: ORAL_TABLET | ORAL | 11 refills | Status: DC

## 2017-10-20 MED ORDER — METOPROLOL TARTRATE 25 MG PO TABS: ORAL_TABLET | ORAL | 11 refills | Status: DC

## 2017-10-20 MED ORDER — FLUTICASONE-SALMETEROL 250-50 MCG/DOSE IN AEPB: INHALATION_SPRAY | RESPIRATORY_TRACT | 11 refills | Status: DC

## 2017-10-20 MED ORDER — AGAMATRIX ULTRA-THIN LANCETS MISC: 11 refills | Status: DC

## 2017-10-20 MED ORDER — METFORMIN HCL 500 MG PO TABS: 500.0000 mg | ORAL_TABLET | Freq: Two times a day (BID) | ORAL | 11 refills | Status: DC

## 2017-10-20 MED ORDER — GLUCOSE BLOOD VI STRP: ORAL_STRIP | 12 refills | Status: DC

## 2017-10-20 MED ORDER — ALBUTEROL SULFATE HFA 108 (90 BASE) MCG/ACT IN AERS: 1.0000 | INHALATION_SPRAY | Freq: Four times a day (QID) | RESPIRATORY_TRACT | 0 refills | Status: DC | PRN

## 2017-10-20 MED ORDER — AGAMATRIX PRESTO W/DEVICE KIT: PACK | 0 refills | Status: DC
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
## 2017-10-20 NOTE — Patient Instructions (Signed)

## 2017-10-21 LAB — CBC WITH DIFFERENTIAL/PLATELET
BASOS: 0 %
Basophils Absolute: 0 10*3/uL (ref 0.0–0.2)
EOS (ABSOLUTE): 0.1 10*3/uL (ref 0.0–0.4)
EOS: 2 %
HEMATOCRIT: 40.7 % (ref 34.0–46.6)
HEMOGLOBIN: 13.5 g/dL (ref 11.1–15.9)
IMMATURE GRANS (ABS): 0 10*3/uL (ref 0.0–0.1)
IMMATURE GRANULOCYTES: 0 %
LYMPHS: 44 %
Lymphocytes Absolute: 1.9 10*3/uL (ref 0.7–3.1)
MCH: 29.2 pg (ref 26.6–33.0)
MCHC: 33.2 g/dL (ref 31.5–35.7)
MCV: 88 fL (ref 79–97)
MONOCYTES: 8 %
Monocytes Absolute: 0.4 10*3/uL (ref 0.1–0.9)
NEUTROS PCT: 46 %
Neutrophils Absolute: 2 10*3/uL (ref 1.4–7.0)
Platelets: 311 10*3/uL (ref 150–379)
RBC: 4.63 x10E6/uL (ref 3.77–5.28)
RDW: 12.5 % (ref 12.3–15.4)
WBC: 4.3 10*3/uL (ref 3.4–10.8)

## 2017-10-21 LAB — COMPREHENSIVE METABOLIC PANEL
A/G RATIO: 1.1 — AB (ref 1.2–2.2)
ALT: 38 IU/L — ABNORMAL HIGH (ref 0–32)
AST: 44 IU/L — ABNORMAL HIGH (ref 0–40)
Albumin: 4 g/dL (ref 3.5–5.5)
Alkaline Phosphatase: 95 IU/L (ref 39–117)
BUN/Creatinine Ratio: 11 (ref 9–23)
BUN: 6 mg/dL (ref 6–24)
Bilirubin Total: 0.5 mg/dL (ref 0.0–1.2)
CALCIUM: 9.4 mg/dL (ref 8.7–10.2)
CO2: 26 mmol/L (ref 20–29)
CREATININE: 0.56 mg/dL — AB (ref 0.57–1.00)
Chloride: 97 mmol/L (ref 96–106)
GFR, EST AFRICAN AMERICAN: 124 mL/min/{1.73_m2} (ref >59)
GFR, EST NON AFRICAN AMERICAN: 108 mL/min/{1.73_m2} (ref >59)
GLOBULIN, TOTAL: 3.6 g/dL (ref 1.5–4.5)
Glucose: 297 mg/dL — ABNORMAL HIGH (ref 65–99)
POTASSIUM: 4.4 mmol/L (ref 3.5–5.2)
SODIUM: 136 mmol/L (ref 134–144)
TOTAL PROTEIN: 7.6 g/dL (ref 6.0–8.5)

## 2017-10-21 LAB — HGB A1C W/O EAG: Hgb A1c MFr Bld: 13.9 % — ABNORMAL HIGH (ref 4.8–5.6)

## 2017-10-26 ENCOUNTER — Other Ambulatory Visit: Payer: Self-pay | Admitting: Licensed Clinical Social Worker

## 2017-10-30 ENCOUNTER — Other Ambulatory Visit: Payer: Self-pay | Admitting: Licensed Clinical Social Worker

## 2017-10-31 ENCOUNTER — Other Ambulatory Visit: Payer: Self-pay

## 2017-10-31 ENCOUNTER — Ambulatory Visit (HOSPITAL_COMMUNITY)
Admission: EM | Admit: 2017-10-31 | Discharge: 2017-10-31 | Disposition: A | Discharge status: Home / Self Care | Payer: Self-pay | Attending: Family Medicine | Admitting: Family Medicine

## 2017-10-31 ENCOUNTER — Encounter (HOSPITAL_COMMUNITY): Payer: Self-pay

## 2017-10-31 DIAGNOSIS — M545 Low back pain: Secondary | ICD-10-CM

## 2017-10-31 MED ORDER — NAPROXEN 500 MG PO TABS: 500.0000 mg | ORAL_TABLET | Freq: Two times a day (BID) | ORAL | 0 refills | Status: DC

## 2017-10-31 MED ORDER — CYCLOBENZAPRINE HCL 10 MG PO TABS: 10.0000 mg | ORAL_TABLET | Freq: Two times a day (BID) | ORAL | 0 refills | Status: DC | PRN

## 2017-10-31 NOTE — Discharge Instructions (Signed)
Heat (pad or rice pillow in microwave) over affected area, 10-15 minutes twice daily.   OK to take Tylenol 1000 mg (2 extra strength tabs) or 975 mg (3 regular strength tabs) every 6 hours as needed.  EXERCISES  RANGE OF MOTION (ROM) AND STRETCHING EXERCISES - Low Back Prain Most people with lower back pain will find that their symptoms get worse with excessive bending forward (flexion) or arching at the lower back (extension). The exercises that will help resolve your symptoms will focus on the opposite motion.  If you have pain, numbness or tingling which travels down into your buttocks, leg or foot, the goal of the therapy is for these symptoms to move closer to your back and eventually resolve. Sometimes, these leg symptoms will get better, but your lower back pain may worsen. This is often an indication of progress in your rehabilitation. Be very alert to any changes in your symptoms and the activities in which you participated in the 24 hours prior to the change. Sharing this information with your caregiver will allow him or her to most efficiently treat your condition. These exercises may help you when beginning to rehabilitate your injury. Your symptoms may resolve with or without further involvement from your physician, physical therapist or athletic trainer. While completing these exercises, remember:  Restoring tissue flexibility helps normal motion to return to the joints. This allows healthier, less painful movement and activity. An effective stretch should be held for at least 30 seconds. A stretch should never be painful. You should only feel a gentle lengthening or release in the stretched tissue. FLEXION RANGE OF MOTION AND STRETCHING EXERCISES:  STRETCH - Flexion, Single Knee to Chest  Lie on a firm bed or floor with both legs extended in front of you. Keeping one leg in contact with the floor, bring your opposite knee to your chest. Hold your leg in place by either grabbing behind  your thigh or at your knee. Pull until you feel a gentle stretch in your low back. Hold 30 seconds. Slowly release your grasp and repeat the exercise with the opposite side. Repeat 2 times. Complete this exercise 3 times per week.   STRETCH - Flexion, Double Knee to Chest Lie on a firm bed or floor with both legs extended in front of you. Keeping one leg in contact with the floor, bring your opposite knee to your chest. Tense your stomach muscles to support your back and then lift your other knee to your chest. Hold your legs in place by either grabbing behind your thighs or at your knees. Pull both knees toward your chest until you feel a gentle stretch in your low back. Hold 30 seconds. Tense your stomach muscles and slowly return one leg at a time to the floor. Repeat 2 times. Complete this exercise 3 times per week.   STRETCH - Low Trunk Rotation Lie on a firm bed or floor. Keeping your legs in front of you, bend your knees so they are both pointed toward the ceiling and your feet are flat on the floor. Extend your arms out to the side. This will stabilize your upper body by keeping your shoulders in contact with the floor. Gently and slowly drop both knees together to one side until you feel a gentle stretch in your low back. Hold for 30 seconds. Tense your stomach muscles to support your lower back as you bring your knees back to the starting position. Repeat the exercise to the other side. Repeat  bring your knees back to the starting position. Repeat the exercise to the other side. Repeat 2 times. Complete this exercise at least 3 times per week.   EXTENSION RANGE OF MOTION AND FLEXIBILITY EXERCISES:  STRETCH - Extension, Prone on Elbows   Lie on your stomach on the floor, a bed will be too soft. Place your palms about shoulder width apart and at the height of your head.  Place your elbows under your shoulders. If this is too painful, stack pillows under your chest.  Allow your body to relax so that your hips drop lower and make contact  more completely with the floor.  Hold this position for 30 seconds.  Slowly return to lying flat on the floor. Repeat 2 times. Complete this exercise 3 times per week.   RANGE OF MOTION - Extension, Prone Press Ups  Lie on your stomach on the floor, a bed will be too soft. Place your palms about shoulder width apart and at the height of your head.  Keeping your back as relaxed as possible, slowly straighten your elbows while keeping your hips on the floor. You may adjust the placement of your hands to maximize your comfort. As you gain motion, your hands will come more underneath your shoulders.  Hold this position 30 seconds.  Slowly return to lying flat on the floor. Repeat 2 times. Complete this exercise 3 times per week.   RANGE OF MOTION- Quadruped, Neutral Spine   Assume a hands and knees position on a firm surface. Keep your hands under your shoulders and your knees under your hips. You may place padding under your knees for comfort.  Drop your head and point your tailbone toward the ground below you. This will round out your lower back like an angry cat. Hold this position for 30 seconds.  Slowly lift your head and release your tail bone so that your back sags into a large arch, like an old horse.  Hold this position for 30 seconds.  Repeat this until you feel limber in your low back.  Now, find your "sweet spot." This will be the most comfortable position somewhere between the two previous positions. This is your neutral spine. Once you have found this position, tense your stomach muscles to support your low back.  Hold this position for 30 seconds. Repeat 2 times. Complete this exercise 3 times per week.   STRENGTHENING EXERCISES - Low Back Sprain These exercises may help you when beginning to rehabilitate your injury. These exercises should be done near your "sweet spot." This is the neutral, low-back arch, somewhere between fully rounded and fully arched, that is your  least painful position. When performed in this safe range of motion, these exercises can be used for people who have either a flexion or extension based injury. These exercises may resolve your symptoms with or without further involvement from your physician, physical therapist or athletic trainer. While completing these exercises, remember:   Muscles can gain both the endurance and the strength needed for everyday activities through controlled exercises.  Complete these exercises as instructed by your physician, physical therapist or athletic trainer. Increase the resistance and repetitions only as guided.  You may experience muscle soreness or fatigue, but the pain or discomfort you are trying to eliminate should never worsen during these exercises. If this pain does worsen, stop and make certain you are following the directions exactly. If the pain is still present after adjustments, discontinue the exercise until you can discuss   the trouble with your caregiver.  STRENGTHENING - Deep Abdominals, Pelvic Tilt   Lie on a firm bed or floor. Keeping your legs in front of you, bend your knees so they are both pointed toward the ceiling and your feet are flat on the floor.  Tense your lower abdominal muscles to press your low back into the floor. This motion will rotate your pelvis so that your tail bone is scooping upwards rather than pointing at your feet or into the floor. With a gentle tension and even breathing, hold this position for 3 seconds. Repeat 2 times. Complete this exercise 3 times per week.   STRENGTHENING - Abdominals, Crunches   Lie on a firm bed or floor. Keeping your legs in front of you, bend your knees so they are both pointed toward the ceiling and your feet are flat on the floor. Cross your arms over your chest.  Slightly tip your chin down without bending your neck.  Tense your abdominals and slowly lift your trunk high enough to just clear your shoulder blades. Lifting  higher can put excessive stress on the lower back and does not further strengthen your abdominal muscles.  Control your return to the starting position. Repeat 2 times. Complete this exercise 3 times per week.   STRENGTHENING - Quadruped, Opposite UE/LE Lift   Assume a hands and knees position on a firm surface. Keep your hands under your shoulders and your knees under your hips. You may place padding under your knees for comfort.  Find your neutral spine and gently tense your abdominal muscles so that you can maintain this position. Your shoulders and hips should form a rectangle that is parallel with the floor and is not twisted.  Keeping your trunk steady, lift your right hand no higher than your shoulder and then your left leg no higher than your hip. Make sure you are not holding your breath. Hold this position for 30 seconds.  Continuing to keep your abdominal muscles tense and your back steady, slowly return to your starting position. Repeat with the opposite arm and leg. Repeat 2 times. Complete this exercise 3 times per week.   STRENGTHENING - Abdominals and Quadriceps, Straight Leg Raise   Lie on a firm bed or floor with both legs extended in front of you.  Keeping one leg in contact with the floor, bend the other knee so that your foot can rest flat on the floor.  Find your neutral spine, and tense your abdominal muscles to maintain your spinal position throughout the exercise.  Slowly lift your straight leg off the floor about 6 inches for a count of 3, making sure to not hold your breath.  Still keeping your neutral spine, slowly lower your leg all the way to the floor. Repeat this exercise with each leg 2 times. Complete this exercise 3 times per week.  POSTURE AND BODY MECHANICS CONSIDERATIONS - Low Back Sprain Keeping correct posture when sitting, standing or completing your activities will reduce the stress put on different body tissues, allowing injured tissues a  chance to heal and limiting painful experiences. The following are general guidelines for improved posture.  While reading these guidelines, remember:  The exercises prescribed by your provider will help you have the flexibility and strength to maintain correct postures.  The correct posture provides the best environment for your joints to work. All of your joints have less wear and tear when properly supported by a spine with good posture. This means you   will experience a healthier, less painful body.  Correct posture must be practiced with all of your activities, especially prolonged sitting and standing. Correct posture is as important when doing repetitive low-stress activities (typing) as it is when doing a single heavy-load activity (lifting).  RESTING POSITIONS Consider which positions are most painful for you when choosing a resting position. If you have pain with flexion-based activities (sitting, bending, stooping, squatting), choose a position that allows you to rest in a less flexed posture. You would want to avoid curling into a fetal position on your side. If your pain worsens with extension-based activities (prolonged standing, working overhead), avoid resting in an extended position such as sleeping on your stomach. Most people will find more comfort when they rest with their spine in a more neutral position, neither too rounded nor too arched. Lying on a non-sagging bed on your side with a pillow between your knees, or on your back with a pillow under your knees will often provide some relief. Keep in mind, being in any one position for a prolonged period of time, no matter how correct your posture, can still lead to stiffness.  PROPER SITTING POSTURE In order to minimize stress and discomfort on your spine, you must sit with correct posture. Sitting with good posture should be effortless for a healthy body. Returning to good posture is a gradual process. Many people can work toward this  most comfortably by using various supports until they have the flexibility and strength to maintain this posture on their own. When sitting with proper posture, your ears will fall over your shoulders and your shoulders will fall over your hips. You should use the back of the chair to support your upper back. Your lower back will be in a neutral position, just slightly arched. You may place a small pillow or folded towel at the base of your lower back for  support.  When working at a desk, create an environment that supports good, upright posture. Without extra support, muscles tire, which leads to excessive strain on joints and other tissues. Keep these recommendations in mind:  CHAIR:  A chair should be able to slide under your desk when your back makes contact with the back of the chair. This allows you to work closely.  The chair's height should allow your eyes to be level with the upper part of your monitor and your hands to be slightly lower than your elbows.  BODY POSITION  Your feet should make contact with the floor. If this is not possible, use a foot rest.  Keep your ears over your shoulders. This will reduce stress on your neck and low back.  INCORRECT SITTING POSTURES  If you are feeling tired and unable to assume a healthy sitting posture, do not slouch or slump. This puts excessive strain on your back tissues, causing more damage and pain. Healthier options include:  Using more support, like a lumbar pillow.  Switching tasks to something that requires you to be upright or walking.  Talking a brief walk.  Lying down to rest in a neutral-spine position.  PROLONGED STANDING WHILE SLIGHTLY LEANING FORWARD  When completing a task that requires you to lean forward while standing in one place for a long time, place either foot up on a stationary 2-4 inch high object to help maintain the best posture. When both feet are on the ground, the lower back tends to lose its slight inward  curve. If this curve flattens (or becomes too   large), then the back and your other joints will experience too much stress, tire more quickly, and can cause pain.  CORRECT STANDING POSTURES Proper standing posture should be assumed with all daily activities, even if they only take a few moments, like when brushing your teeth. As in sitting, your ears should fall over your shoulders and your shoulders should fall over your hips. You should keep a slight tension in your abdominal muscles to brace your spine. Your tailbone should point down to the ground, not behind your body, resulting in an over-extended swayback posture.   INCORRECT STANDING POSTURES  Common incorrect standing postures include a forward head, locked knees and/or an excessive swayback. WALKING Walk with an upright posture. Your ears, shoulders and hips should all line-up.  PROLONGED ACTIVITY IN A FLEXED POSITION When completing a task that requires you to bend forward at your waist or lean over a low surface, try to find a way to stabilize 3 out of 4 of your limbs. You can place a hand or elbow on your thigh or rest a knee on the surface you are reaching across. This will provide you more stability, so that your muscles do not tire as quickly. By keeping your knees relaxed, or slightly bent, you will also reduce stress across your lower back. CORRECT LIFTING TECHNIQUES  DO :  Assume a wide stance. This will provide you more stability and the opportunity to get as close as possible to the object which you are lifting.  Tense your abdominals to brace your spine. Bend at the knees and hips. Keeping your back locked in a neutral-spine position, lift using your leg muscles. Lift with your legs, keeping your back straight.  Test the weight of unknown objects before attempting to lift them.  Try to keep your elbows locked down at your sides in order get the best strength from your shoulders when carrying an object.     Always ask  for help when lifting heavy or awkward objects. INCORRECT LIFTING TECHNIQUES DO NOT:   Lock your knees when lifting, even if it is a small object.  Bend and twist. Pivot at your feet or move your feet when needing to change directions.  Assume that you can safely pick up even a paperclip without proper posture.   
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
## 2017-11-17 ENCOUNTER — Ambulatory Visit: Payer: Self-pay | Admitting: Internal Medicine

## 2017-11-24 ENCOUNTER — Ambulatory Visit: Payer: Self-pay | Admitting: Internal Medicine

## 2017-12-08 ENCOUNTER — Telehealth: Payer: Self-pay | Admitting: Internal Medicine

## 2017-12-08 NOTE — Telephone Encounter (Signed)
Patient taking lisinopril and is causing her to cough. Please advise.

## 2017-12-14 NOTE — Telephone Encounter (Signed)
Spoke with patient. States she has been having allergies recently as well and has been coughing more than usual. Patient states she is not sure if this is coming from her allergies or lisinopril. Patient has appointment on this coming Friday at 9:30 but she is not sure if she will make the appointment due to her work schedule.  [facility]

## 2017-12-16 NOTE — Telephone Encounter (Signed)
She needs to be seen for this.  Hard to make a determination without an examination.

## 2017-12-18 ENCOUNTER — Ambulatory Visit: Payer: Self-pay | Admitting: Internal Medicine

## 2018-07-19 ENCOUNTER — Encounter (HOSPITAL_COMMUNITY): Payer: Self-pay | Admitting: Emergency Medicine

## 2018-07-19 ENCOUNTER — Other Ambulatory Visit: Payer: Self-pay

## 2018-07-19 ENCOUNTER — Ambulatory Visit (HOSPITAL_COMMUNITY)
Admission: EM | Admit: 2018-07-19 | Discharge: 2018-07-19 | Disposition: A | Discharge status: Home / Self Care | Payer: Self-pay

## 2018-07-19 DIAGNOSIS — R059 Cough, unspecified: Secondary | ICD-10-CM

## 2018-07-19 DIAGNOSIS — E1165 Type 2 diabetes mellitus with hyperglycemia: Secondary | ICD-10-CM

## 2018-07-19 DIAGNOSIS — R05 Cough: Secondary | ICD-10-CM

## 2018-07-19 MED ORDER — LOSARTAN POTASSIUM 25 MG PO TABS: 25.0000 mg | ORAL_TABLET | Freq: Every day | ORAL | 1 refills | Status: DC

## 2018-07-19 MED ORDER — PANTOPRAZOLE SODIUM 40 MG PO TBEC
40.0000 mg | DELAYED_RELEASE_TABLET | Freq: Every day | ORAL | 1 refills | Status: DC
Start: 2018-07-19 — End: 2023-11-16

## 2018-07-19 MED ORDER — BENZONATATE 100 MG PO CAPS: 100.0000 mg | ORAL_CAPSULE | Freq: Three times a day (TID) | ORAL | 0 refills | Status: DC | PRN

## 2018-07-19 NOTE — ED Triage Notes (Addendum)
Cough is intermittent.  Cough for 2 months. Coughs so hard and long that she almost vomits.  Patient has sinus drainage.   Patient questioned the starting of lisinopril and increase in coughing

## 2018-07-19 NOTE — Discharge Instructions (Addendum)
Please make sure that you are eating less or limit your carbohydrates such as white rice, potatoes, white breads, pasta, pizza, sodas, beer, sweet tea, fruit juices. Exercise can also help with your blood sugar. You can instead eat more salads, fruits, vegetables, whole grains, wheat, oats.   Salads - kale, spinach, cabbage, spring mix; use seeds like pumpkin seeds or sunflower seeds; you can also use 1-2 hard boiled eggs. Fruits - avocadoes, berries (blueberries, raspberries, blackberries), apples, oranges, pomegranate, grapefruit Seeds - quinoa, chia seeds; you can also incorporate oatmeal Vegetables - aspargus, cauliflower, broccoli, green beans, brussel spouts, bell peppers; stay away from starchy vegetables like potatoes, carrots, peas

## 2018-07-19 NOTE — ED Provider Notes (Signed)
MRN: [id number] DOB: 07-08-1966  Subjective:   Cassie Mata is a 52 y.o. female presenting for 2 month history of persistent, occasionally productive cough with intermittent post-tussive emesis. Smelling fumes elicits her cough as well, reports having seasonal bronchitis. Has a history of asthma, strong smells aggravate her asthma. She is not using her albuterol inhaler currently. Has a history of acid reflux, spoke with her PCP that told her she needs to get her acid reflux under control to help her cough. Patient has severely uncontrolled diabetes, last a1c was 13.9% in 10/2017. Patient is currently taking glipizide, takes metformin inconsistently. Patient does take lisinopril daily since last year.  Patient checks her BP at home, readings have been in the 120s.  Denies confusion, fever, hemoptysis, headaches, throat pain, belly pain, hematuria, rashes.  Denies smoking cigarettes.  Denies history of COPD.  No current facility-administered medications for this encounter.   Current Outpatient Medications:  .  cholecalciferol (VITAMIN D) 1000 UNITS tablet, Take 2,000 Units by mouth at bedtime. , Disp: , Rfl:  .  diphenhydrAMINE (BENADRYL) 25 mg capsule, Take 25 mg by mouth every 6 (six) hours as needed., Disp: , Rfl:  .  glipiZIDE (GLUCOTROL) 5 MG tablet, 1 tab by mouth twice daily with meals, Disp: 60 tablet, Rfl: 11 .  lisinopril (PRINIVIL,ZESTRIL) 5 MG tablet, 1 tab by mouth daily, Disp: 30 tablet, Rfl: 11 .  metFORMIN (GLUCOPHAGE) 500 MG tablet, Take 1 tablet (500 mg total) by mouth 2 (two) times daily., Disp: 60 tablet, Rfl: 11 .  metoprolol tartrate (LOPRESSOR) 25 MG tablet, 1/2 tab by mouth twice daily, Disp: 30 tablet, Rfl: 11 .  Multiple Vitamin (MULTIVITAMIN) tablet, Take 1 tablet by mouth daily., Disp: , Rfl:  .  AGAMATRIX ULTRA-THIN LANCETS MISC, Check sugars twice daily before meals, Disp: 100 each, Rfl: 11 .  albuterol (PROVENTIL HFA;VENTOLIN HFA) 108 (90 Base) MCG/ACT inhaler,  Inhale 1-2 puffs into the lungs every 6 (six) hours as needed for wheezing., Disp: 1 Inhaler, Rfl: 0 .  aspirin 81 MG tablet, Take 81 mg by mouth daily. , Disp: , Rfl:  .  Blood Glucose Monitoring Suppl (AGAMATRIX PRESTO) w/Device KIT, Check blood sugars twice daily before meals, Disp: 1 kit, Rfl: 0 .  cyclobenzaprine (FLEXERIL) 10 MG tablet, Take 1 tablet (10 mg total) by mouth 2 (two) times daily as needed for muscle spasms., Disp: 20 tablet, Rfl: 0 .  Fluticasone-Salmeterol (ADVAIR) 250-50 MCG/DOSE AEPB, 1 inhalation twice daily followed by brushing teeth and tongue, Disp: 1 each, Rfl: 11 .  glucose blood (AGAMATRIX PRESTO TEST) test strip, Check sugars twice daily before meals, Disp: 100 each, Rfl: 12 .  nitroGLYCERIN (NITROSTAT) 0.4 MG SL tablet, Place 0.4 mg under the tongue every 5 (five) minutes as needed for chest pain., Disp: , Rfl:  .  pantoprazole (PROTONIX) 40 MG tablet, Take 40 mg by mouth daily. Reported on 09/13/2015, Disp: , Rfl:     Allergies  Allergen Reactions  . Ivp Dye [Iodinated Diagnostic Agents] Nausea Only  . Lunesta [Eszopiclone]   . Other     "allergy medications"= makes heart flutter  . Prednisone Nausea Only    Elevates blood sugar  . Ultracet [Tramadol-Acetaminophen] Nausea And Vomiting    Past Medical History:  Diagnosis Date  . Arthritis   . Asthma   . Coma ([place])   . Depression   . Diabetes mellitus   . Fibromyalgia   . Gall stone   . Gastroesophageal reflux disease   .  Hiatal hernia   . Insomnia 08/07/2014  . Myalgia and myositis, unspecified 01/13/2013  . Stroke [facility]) 2007   Pt. evaluated at [facility] had left sided weakness.  Reportedly no cerebral imaging ever done.  Records we have received from [facility] dated 2011 do not document this.  Rest of records in storage and have not yet sent.     Past Surgical History:  Procedure Laterality Date  . CESAREAN SECTION  [phone]    Objective:   Vitals: BP (!) 157/91  (BP Location: Right Arm)   Pulse (!) 104   Temp 97.9 F (36.6 C) (Oral)   Resp 20   LMP 09/06/2015 (Exact Date)   SpO2 100%   Physical Exam  Constitutional: She is oriented to person, place, and time. She appears well-developed and well-nourished.  HENT:  Mouth/Throat: Oropharynx is clear and moist.  Eyes: Pupils are equal, round, and reactive to light. EOM are normal. Right eye exhibits no discharge. Left eye exhibits no discharge. No scleral icterus.  Neck: Normal range of motion. Neck supple.  Cardiovascular: Normal rate, regular rhythm, normal heart sounds and intact distal pulses. Exam reveals no gallop and no friction rub.  No murmur heard. Pulmonary/Chest: Effort normal and breath sounds normal. No stridor. No respiratory distress. She has no wheezes. She has no rales.  Lymphadenopathy:    She has no cervical adenopathy.  Neurological: She is alert and oriented to person, place, and time.  Skin: Skin is warm and dry.  Psychiatric: She has a normal mood and affect.    Assessment and Plan :   Cough  Uncontrolled type 2 diabetes mellitus with hyperglycemia (HCC)  We will have patient stop lisinopril, start losartan.  I refilled her Protonix to address GERD as a source of her cough.  Provided Tessalon capsules to help with symptomatic relief.  Counseled patient extensively on need for medical compliance given her severely uncontrolled diabetes.  She refused to do any extra testing today given that she does not have insurance and does not want to be responsible for extra costs from diagnostic tests.  I counseled that it could help dictate whether or not she needs to go to the ER given that she is not on her insulin and has an A1c that is likely higher than 14% now.  Patient verbalized understanding the risks of uncontrolled diabetes which includes life-threatening events.  She plans on obtaining insurance soon so that she can follow-up with her PCP.  Strict ER precautions  reviewed.     Jaynee Eagles, PA-C 07/19/18 2005

## 2019-01-09 ENCOUNTER — Other Ambulatory Visit: Payer: Self-pay | Admitting: Internal Medicine

## 2019-01-10 NOTE — Telephone Encounter (Signed)
To Dr. Mulberry for approval 

## 2019-01-11 ENCOUNTER — Telehealth: Payer: Self-pay | Admitting: Internal Medicine

## 2019-01-17 NOTE — Telephone Encounter (Signed)
noted 

## 2019-09-19 ENCOUNTER — Other Ambulatory Visit: Payer: Self-pay

## 2019-09-19 ENCOUNTER — Encounter (HOSPITAL_COMMUNITY): Payer: Self-pay | Admitting: Emergency Medicine

## 2019-09-19 ENCOUNTER — Emergency Department (HOSPITAL_COMMUNITY)
Admission: EM | Admit: 2019-09-19 | Discharge: 2019-09-20 | Disposition: A | Discharge status: Home / Self Care | Payer: Medicaid Other | Attending: Emergency Medicine | Admitting: Emergency Medicine

## 2019-09-19 DIAGNOSIS — Z5321 Procedure and treatment not carried out due to patient leaving prior to being seen by health care provider: Secondary | ICD-10-CM | POA: Insufficient documentation

## 2019-09-19 DIAGNOSIS — R040 Epistaxis: Secondary | ICD-10-CM | POA: Insufficient documentation

## 2019-09-19 NOTE — ED Triage Notes (Signed)
Patient slipped on icy floor last Saturday , seen at [facility] - discharge home , requesting head CT scan due to brief episode of epistaxis this evening , no LOC , alert and oriented , no headache .

## 2019-09-20 NOTE — ED Notes (Signed)
Pt did not respond when called for vitals check 

## 2019-12-20 DIAGNOSIS — E559 Vitamin D deficiency, unspecified: Secondary | ICD-10-CM | POA: Insufficient documentation

## 2020-04-15 ENCOUNTER — Encounter (HOSPITAL_BASED_OUTPATIENT_CLINIC_OR_DEPARTMENT_OTHER): Payer: Self-pay | Admitting: Emergency Medicine

## 2020-04-15 ENCOUNTER — Emergency Department (HOSPITAL_BASED_OUTPATIENT_CLINIC_OR_DEPARTMENT_OTHER): Payer: Medicaid Other

## 2020-04-15 ENCOUNTER — Emergency Department (HOSPITAL_BASED_OUTPATIENT_CLINIC_OR_DEPARTMENT_OTHER)
Admission: EM | Admit: 2020-04-15 | Discharge: 2020-04-15 | Disposition: A | Discharge status: Home / Self Care | Payer: Medicaid Other | Attending: Emergency Medicine | Admitting: Emergency Medicine

## 2020-04-15 DIAGNOSIS — Z87891 Personal history of nicotine dependence: Secondary | ICD-10-CM | POA: Insufficient documentation

## 2020-04-15 DIAGNOSIS — J45909 Unspecified asthma, uncomplicated: Secondary | ICD-10-CM | POA: Insufficient documentation

## 2020-04-15 DIAGNOSIS — W010XXA Fall on same level from slipping, tripping and stumbling without subsequent striking against object, initial encounter: Secondary | ICD-10-CM | POA: Insufficient documentation

## 2020-04-15 DIAGNOSIS — Y939 Activity, unspecified: Secondary | ICD-10-CM | POA: Insufficient documentation

## 2020-04-15 DIAGNOSIS — R519 Headache, unspecified: Secondary | ICD-10-CM | POA: Insufficient documentation

## 2020-04-15 DIAGNOSIS — E119 Type 2 diabetes mellitus without complications: Secondary | ICD-10-CM | POA: Insufficient documentation

## 2020-04-15 DIAGNOSIS — M545 Low back pain: Secondary | ICD-10-CM | POA: Insufficient documentation

## 2020-04-15 DIAGNOSIS — I10 Essential (primary) hypertension: Secondary | ICD-10-CM | POA: Insufficient documentation

## 2020-04-15 DIAGNOSIS — Z79899 Other long term (current) drug therapy: Secondary | ICD-10-CM | POA: Insufficient documentation

## 2020-04-15 DIAGNOSIS — Y92512 Supermarket, store or market as the place of occurrence of the external cause: Secondary | ICD-10-CM | POA: Insufficient documentation

## 2020-04-15 DIAGNOSIS — Y999 Unspecified external cause status: Secondary | ICD-10-CM | POA: Insufficient documentation

## 2020-04-15 DIAGNOSIS — M549 Dorsalgia, unspecified: Secondary | ICD-10-CM

## 2020-04-15 DIAGNOSIS — M25512 Pain in left shoulder: Secondary | ICD-10-CM | POA: Insufficient documentation

## 2020-04-15 DIAGNOSIS — Z7984 Long term (current) use of oral hypoglycemic drugs: Secondary | ICD-10-CM | POA: Insufficient documentation

## 2020-04-15 DIAGNOSIS — Z7982 Long term (current) use of aspirin: Secondary | ICD-10-CM | POA: Insufficient documentation

## 2020-04-15 MED ORDER — LOSARTAN POTASSIUM 25 MG PO TABS
25.0000 mg | ORAL_TABLET | Freq: Every day | ORAL | 0 refills | Status: DC
Start: 2020-04-15 — End: 2021-03-22

## 2020-04-15 MED ORDER — LIDOCAINE 5 % EX PTCH: 1.0000 | MEDICATED_PATCH | CUTANEOUS | 0 refills | Status: DC

## 2020-04-15 NOTE — ED Triage Notes (Signed)
Pt has recent slip and fall in [employer]. Pt seen at urgent care for same but did not have x-rays of upper body and head. Pt c/o upper back and neck pain, shoulder pain and head pain.

## 2020-04-15 NOTE — ED Notes (Signed)
Pt requested more of her BP meds states has been out for 3 days Losartan which PA gladly sent for her , pt also given copies of xrays

## 2020-04-15 NOTE — Medical Student Note (Signed)
[facility] DEPT [facility] Provider Student Note For educational purposes for Medical, PA and NP students only and not part of the legal medical record.   CSN: [id number] Arrival date & time: 04/15/20  1407      History   Chief Complaint Chief Complaint  Patient presents with  . Fall    HPI Cassie Mata is a 54 y.o. female hx of asthma, htn, dm, chronic back pain,  fell on Friday in [place] after slipping on a spilled substance. Hit head and back. Went to urgent care the next day (yesterday) and was given Voltaren. Takes Aspirin 59m daily as well as meds for DM and Losartan and a BB for BP.   Has had a headache on and off, some intermittent nausea relieved with ginger ale and potato chips, no episodes of confusion or LOC. Didn't lose conciousness when she fell. Some difficulty walking due to soreness. Feels that her legs are weak (has a hx of sciatic nerve damage)   HPI  Past Medical History:  Diagnosis Date  . Arthritis   . Asthma   . [place] ([place]   . Depression   . Diabetes mellitus   . Fibromyalgia   . Gall stone   . Gastroesophageal reflux disease   . Hiatal hernia   . Insomnia 08/07/2014  . Myalgia and myositis, unspecified 01/13/2013  . Stroke [facility] 2007   [facility].  Reportedly no cerebral imaging ever done.  [facility].  Rest of records in storage and have not yet sent.    Patient Active Problem List   Diagnosis Date Noted  . Diabetes mellitus type II, uncontrolled ([place] 08/08/2015  . Essential hypertension 08/08/2015  . GERD (gastroesophageal reflux disease) 08/08/2015  . 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 Arthritis 08/08/2015  . Depression 06/15/2015  . Insomnia 08/07/2014  . Myalgia and myositis 01/13/2013    Past Surgical History:  Procedure Laterality Date  . CESAREAN SECTION  1987,1988,1996,2006    OB History     No obstetric history on file.      Home Medications    Prior to Admission medications   Medication Sig Start Date End Date Taking? Authorizing Provider  ATruddie CrumbleULTRA-THIN LANCETS MISC Check sugars twice daily before meals 10/20/17   MMack Hook MD  albuterol (PROVENTIL HFA;VENTOLIN HFA) 108 (90 Base) MCG/ACT inhaler Inhale 1-2 puffs into the lungs every 6 (six) hours as needed for wheezing. 10/20/17   MMack Hook MD  aspirin 81 MG tablet Take 81 mg by mouth daily.     [provider]  benzonatate (TESSALON) 100 MG capsule Take 1-2 capsules (100-200 mg total) by mouth 3 (three) times daily as needed. 07/19/18   MJaynee Eagles PA-C  Blood Glucose Monitoring Suppl (AGAMATRIX PRESTO) w/Device KIT Check blood sugars twice daily before meals 10/20/17   MMack Hook MD  cholecalciferol (VITAMIN D) 1000 UNITS tablet Take 2,000 Units by mouth at bedtime.     [provider]  cyclobenzaprine (FLEXERIL) 10 MG tablet Take 1 tablet (10 mg total) by mouth 2 (two) times daily as needed for muscle spasms. 10/31/17   WShelda Pal DO  diphenhydrAMINE (BENADRYL) 25 mg capsule Take 25 mg by mouth every 6 (six) hours as needed.    [provider]  Fluticasone-Salmeterol (ADVAIR) 250-50 MCG/DOSE AEPB 1 inhalation twice daily followed by brushing teeth and tongue 10/20/17  Mack Hook, MD  glipiZIDE (GLUCOTROL) 5 MG tablet TAKE 1 TABLET BY MOUTH TWICE DAILY WITH MEALS 01/11/19   Mack Hook, MD  glucose blood (AGAMATRIX PRESTO TEST) test strip Check sugars twice daily before meals 10/20/17   Mack Hook, MD  losartan (COZAAR) 25 MG tablet Take 1 tablet (25 mg total) by mouth daily. 07/19/18   Jaynee Eagles, PA-C  metFORMIN (GLUCOPHAGE) 500 MG tablet Take 1 tablet by mouth twice daily 01/11/19   Mack Hook, MD  metoprolol tartrate (LOPRESSOR) 25 MG tablet Take 1/2 (one-half) tablet by mouth twice daily 01/11/19   Mack Hook, MD  Multiple Vitamin (MULTIVITAMIN) tablet Take 1 tablet by mouth daily.    [provider]  nitroGLYCERIN (NITROSTAT) 0.4 MG SL tablet Place 0.4 mg under the tongue every 5 (five) minutes as needed for chest pain.    [provider]  pantoprazole (PROTONIX) 40 MG tablet Take 1 tablet (40 mg total) by mouth daily. Reported on 09/13/2015 07/19/18   Jaynee Eagles, PA-C    Family History Family History  Problem Relation Age of Onset  . Diabetes Sister   . Alcohol abuse Sister   . Cirrhosis Sister   . Mental illness Son        suicidal thoughts  . ADD / ADHD Son   . Diabetes Father   . Cancer Father        Brain  . Cirrhosis Mother        alcoholic  . Mental illness Brother   . Arthritis Sister   . Cancer Sister        ?possible gastric cancer  . ADD / ADHD Son   . ADD / ADHD Son   . Mental illness Son        schizophrenia  . Blindness Son        Legal blindness    Social History Social History   Tobacco Use  . Smoking status: Former Smoker    Years: 17.00    Types: Cigarettes    Quit date: 08/03/1995    Years since quitting: 24.7  . Smokeless tobacco: Never Used  Substance Use Topics  . Alcohol use: No  . Drug use: No     Allergies   Gabapentin, Ivp dye [iodinated diagnostic agents], Lunesta [eszopiclone], Other, Prednisone, and Ultracet [tramadol-acetaminophen]   Review of Systems Review of Systems   Physical Exam Updated Vital Signs BP (!) 158/99   Pulse 95   Temp 98.1 F (36.7 C)   Resp 18   Ht 5' (1.524 m)   Wt 68.5 kg   LMP 09/06/2015 (Exact Date)   SpO2 100%   BMI 29.49 kg/m   Physical Exam   ED Treatments / Results  Labs (all labs ordered are listed, but only abnormal results are displayed) Labs Reviewed - No data to display  EKG  Radiology No results found.  Procedures Procedures (including critical care time)  Medications Ordered in ED Medications - No data to display   Initial Impression /  Assessment and Plan / ED Course  I have reviewed the triage vital signs and the nursing notes.  Pertinent labs & imaging results that were available during my care of the patient were reviewed by me and considered in my medical decision making (see chart for details). Final Clinical Impressions(s) / ED Diagnoses   Final diagnoses:  None    New Prescriptions New Prescriptions   No medications on file

## 2020-04-15 NOTE — ED Provider Notes (Signed)
[facility] EMERGENCY DEPARTMENT Provider Note   CSN: [id number] Arrival date & time: 04/15/20  1407     History Chief Complaint  Patient presents with  . Fall    Cassie Mata is a 54 y.o. female.  54 year old female presents for complaint of pain after fall on April 13, 2020.  Patient states that she slipped and fell in [place] on her back and left shoulder.  Patient was seen yesterday at [facility], had an x-ray of lumbar spine and T-spine which were unremarkable.  Patient was advised to continue with her chronic pain medications, diclofenac refilled, encouraged to take her Flexeril. Patient is concerned about left shoulder pain. States she hit her head on the shelf, no LOC, not anticoagulated.         Past Medical History:  Diagnosis Date  . Arthritis   . Asthma   . Coma ([place])   . Depression   . Diabetes mellitus   . Fibromyalgia   . Gall stone   . Gastroesophageal reflux disease   . Hiatal hernia   . Insomnia 08/07/2014  . Myalgia and myositis, unspecified 01/13/2013  . Stroke [facility]) 2007   [facility]. [facility] had left sided weakness.  Reportedly no cerebral imaging ever done.  Records we have received from [facility] dated 2011 do not document this.  Rest of records in storage and have not yet sent.    Patient Active Problem List   Diagnosis Date Noted  . Diabetes mellitus type II, uncontrolled ([place]) 08/08/2015  . Essential hypertension 08/08/2015  . GERD (gastroesophageal reflux disease) 08/08/2015  . 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 Arthritis 08/08/2015  . Depression 06/15/2015  . Insomnia 08/07/2014  . Myalgia and myositis 01/13/2013    Past Surgical History:  Procedure Laterality Date  . CESAREAN SECTION  1987,1988,1996,2006     OB History   No obstetric history on file.     Family History  Problem Relation Age of Onset  . Diabetes Sister   . Alcohol abuse Sister   . Cirrhosis Sister   . Mental  illness Son        suicidal thoughts  . ADD / ADHD Son   . Diabetes Father   . Cancer Father        Brain  . Cirrhosis Mother        alcoholic  . Mental illness Brother   . Arthritis Sister   . Cancer Sister        ?possible gastric cancer  . ADD / ADHD Son   . ADD / ADHD Son   . Mental illness Son        schizophrenia  . Blindness Son        Legal blindness    Social History   Tobacco Use  . Smoking status: Former Smoker    Years: 17.00    Types: Cigarettes    Quit date: 08/03/1995    Years since quitting: 24.7  . Smokeless tobacco: Never Used  Substance Use Topics  . Alcohol use: No  . Drug use: No    Home Medications Prior to Admission medications   Medication Sig Start Date End Date Taking? Authorizing Provider  Truddie Crumble ULTRA-THIN LANCETS MISC Check sugars twice daily before meals 10/20/17   Mack Hook, MD  albuterol (PROVENTIL HFA;VENTOLIN HFA) 108 (90 Base) MCG/ACT inhaler Inhale 1-2 puffs into the lungs every 6 (six) hours as needed for wheezing. 10/20/17   Mack Hook,  MD  aspirin 81 MG tablet Take 81 mg by mouth daily.     [provider]  benzonatate (TESSALON) 100 MG capsule Take 1-2 capsules (100-200 mg total) by mouth 3 (three) times daily as needed. 07/19/18   Jaynee Eagles, PA-C  Blood Glucose Monitoring Suppl (AGAMATRIX PRESTO) w/Device KIT Check blood sugars twice daily before meals 10/20/17   Mack Hook, MD  cholecalciferol (VITAMIN D) 1000 UNITS tablet Take 2,000 Units by mouth at bedtime.     [provider]  cyclobenzaprine (FLEXERIL) 10 MG tablet Take 1 tablet (10 mg total) by mouth 2 (two) times daily as needed for muscle spasms. 10/31/17   Shelda Pal, DO  diphenhydrAMINE (BENADRYL) 25 mg capsule Take 25 mg by mouth every 6 (six) hours as needed.    [provider]  Fluticasone-Salmeterol (ADVAIR) 250-50 MCG/DOSE AEPB 1 inhalation twice daily followed by brushing teeth and tongue 10/20/17    Mack Hook, MD  glipiZIDE (GLUCOTROL) 5 MG tablet TAKE 1 TABLET BY MOUTH TWICE DAILY WITH MEALS 01/11/19   Mack Hook, MD  glucose blood (AGAMATRIX PRESTO TEST) test strip Check sugars twice daily before meals 10/20/17   Mack Hook, MD  lidocaine (LIDODERM) 5 % Place 1 patch onto the skin daily. Remove & Discard patch within 12 hours or as directed by MD 04/15/20   Tacy Learn, PA-C  losartan (COZAAR) 25 MG tablet Take 1 tablet (25 mg total) by mouth daily. 07/19/18   Jaynee Eagles, PA-C  metFORMIN (GLUCOPHAGE) 500 MG tablet Take 1 tablet by mouth twice daily 01/11/19   Mack Hook, MD  metoprolol tartrate (LOPRESSOR) 25 MG tablet Take 1/2 (one-half) tablet by mouth twice daily 01/11/19   Mack Hook, MD  Multiple Vitamin (MULTIVITAMIN) tablet Take 1 tablet by mouth daily.    [provider]  nitroGLYCERIN (NITROSTAT) 0.4 MG SL tablet Place 0.4 mg under the tongue every 5 (five) minutes as needed for chest pain.    [provider]  pantoprazole (PROTONIX) 40 MG tablet Take 1 tablet (40 mg total) by mouth daily. Reported on 09/13/2015 07/19/18   Jaynee Eagles, PA-C    Allergies    Gabapentin, Ivp dye [iodinated diagnostic agents], Lunesta [eszopiclone], Other, Prednisone, and Ultracet [tramadol-acetaminophen]  Review of Systems   Review of Systems  Constitutional: Negative for fever.  Eyes: Negative for visual disturbance.  Gastrointestinal: Negative for vomiting.  Musculoskeletal: Positive for arthralgias. Negative for neck pain and neck stiffness.       Acute on chronic back pain  Skin: Negative for rash and wound.  Neurological: Positive for headaches. Negative for numbness.  All other systems reviewed and are negative.   Physical Exam Updated Vital Signs BP (!) 158/99   Pulse 95   Temp 98.1 F (36.7 C)   Resp 18   Ht 5' (1.524 m)   Wt 68.5 kg   LMP 09/06/2015 (Exact Date)   SpO2 100%   BMI 29.49 kg/m   Physical  Exam Vitals and nursing note reviewed.  Constitutional:      General: She is not in acute distress.    Appearance: She is well-developed. She is not diaphoretic.  HENT:     Head: Normocephalic and atraumatic.     Right Ear: Tympanic membrane and ear canal normal.     Left Ear: Tympanic membrane and ear canal normal.  Pulmonary:     Effort: Pulmonary effort is normal.  Musculoskeletal:       Arms:  Cervical back: No bony tenderness. No pain with movement. Normal range of motion.     Thoracic back: No bony tenderness.     Lumbar back: No bony tenderness.       Back:     Comments: Tenderness in left shoulder primarily along medial border of the scapula, does extend through her left trapezius area of the shoulder.  No crepitus, patient has good range of motion with full internal and external rotation without limitation.  Sensation normal, pulses present.  Skin:    General: Skin is warm and dry.     Findings: No bruising, erythema or rash.  Neurological:     Mental Status: She is alert and oriented to person, place, and time.  Psychiatric:        Behavior: Behavior normal.     ED Results / Procedures / Treatments   Labs (all labs ordered are listed, but only abnormal results are displayed) Labs Reviewed - No data to display  EKG None  Radiology DG Shoulder Left  Result Date: 04/15/2020 CLINICAL DATA:  Fall. Pain. Fell backward and landed on her bottom. LEFT shoulder and UPPER/mid back pain since falling 2 days ago. EXAM: LEFT SHOULDER - 2+ VIEW COMPARISON:  None. FINDINGS: There is no evidence of fracture or dislocation. There is no evidence of arthropathy or other focal bone abnormality. Soft tissues are unremarkable. IMPRESSION: Negative. Electronically Signed   By: Nolon Nations M.D.   On: 04/15/2020 16:37    Procedures Procedures (including critical care time)  Medications Ordered in ED Medications - No data to display  ED Course  I have reviewed the triage  vital signs and the nursing notes.  Pertinent labs & imaging results that were available during my care of the patient were reviewed by me and considered in my medical decision making (see chart for details).  Clinical Course as of Apr 15 1644  Cassie Mata Apr 15, 1181  [id number] 54 year old female with complaint of left lower back pain after a fall 2 days ago.  Exam, patient is well-appearing, she has full range of motion of left shoulder.  Patient is concerned that her left shoulder was not x-rayed yesterday at urgent care.  Plan is for x-ray of left shoulder although expect this to be musculoskeletal nature and not fracture related.   [LM]  1625 X-ray left shoulder unremarkable.  Suspect pain secondary to possible contusion from her fall versus muscle spasm versus muscle strain.  Recommend patient continue with her medications as previously prescribed, will give prescription for Lidoderm patches.  Care everywhere records reviewed, x-ray of T-spine and lumbar spine obtained yesterday did not show acute injury.   [LM]    Clinical Course User Index [LM] Roque Lias   MDM Rules/Calculators/A&P                          Final Clinical Impression(s) / ED Diagnoses Final diagnoses:  Musculoskeletal back pain    Rx / DC Orders ED Discharge Orders         Ordered    lidocaine (LIDODERM) 5 %  Every 24 hours     Discontinue  Reprint     04/15/20 1641           Tacy Learn, PA-C 04/15/20 1645    Lucrezia Starch, MD 04/19/20 1635

## 2020-04-15 NOTE — Discharge Instructions (Addendum)
Follow-up with your provider for recheck. Your x-rays today do not show any bony injury.  Suspect your pain is coming from contusions/spasms from your fall. Recommend continue with medications as prescribed, given new prescription for Lidoderm patches.

## 2020-07-30 ENCOUNTER — Ambulatory Visit (HOSPITAL_COMMUNITY)
Admission: EM | Admit: 2020-07-30 | Discharge: 2020-07-30 | Disposition: A | Discharge status: Home / Self Care | Payer: Self-pay | Attending: Family Medicine | Admitting: Family Medicine

## 2020-07-30 ENCOUNTER — Encounter (HOSPITAL_COMMUNITY): Payer: Self-pay

## 2020-07-30 ENCOUNTER — Ambulatory Visit (INDEPENDENT_AMBULATORY_CARE_PROVIDER_SITE_OTHER): Payer: Self-pay

## 2020-07-30 ENCOUNTER — Other Ambulatory Visit: Payer: Self-pay

## 2020-07-30 DIAGNOSIS — R062 Wheezing: Secondary | ICD-10-CM

## 2020-07-30 DIAGNOSIS — M7989 Other specified soft tissue disorders: Secondary | ICD-10-CM

## 2020-07-30 DIAGNOSIS — J189 Pneumonia, unspecified organism: Secondary | ICD-10-CM | POA: Insufficient documentation

## 2020-07-30 DIAGNOSIS — R0602 Shortness of breath: Secondary | ICD-10-CM

## 2020-07-30 DIAGNOSIS — I509 Heart failure, unspecified: Secondary | ICD-10-CM | POA: Insufficient documentation

## 2020-07-30 LAB — COMPREHENSIVE METABOLIC PANEL
ALT: 26 U/L (ref 0–44)
AST: 39 U/L (ref 15–41)
Albumin: 2.5 g/dL — ABNORMAL LOW (ref 3.5–5.0)
Alkaline Phosphatase: 60 U/L (ref 38–126)
Anion gap: 7 (ref 5–15)
BUN: 11 mg/dL (ref 6–20)
CO2: 27 mmol/L (ref 22–32)
Calcium: 8.8 mg/dL — ABNORMAL LOW (ref 8.9–10.3)
Chloride: 104 mmol/L (ref 98–111)
Creatinine, Ser: 0.81 mg/dL (ref 0.44–1.00)
GFR, Estimated: >60 mL/min (ref >60)
Glucose, Bld: 293 mg/dL — ABNORMAL HIGH (ref 70–99)
Potassium: 4.6 mmol/L (ref 3.5–5.1)
Sodium: 138 mmol/L (ref 135–145)
Total Bilirubin: 0.9 mg/dL (ref 0.3–1.2)
Total Protein: 6.4 g/dL — ABNORMAL LOW (ref 6.5–8.1)

## 2020-07-30 LAB — CBC WITH DIFFERENTIAL/PLATELET
Abs Immature Granulocytes: 0.01 10*3/uL (ref 0.00–0.07)
Basophils Absolute: 0 10*3/uL (ref 0.0–0.1)
Basophils Relative: 1 %
Eosinophils Absolute: 0.1 10*3/uL (ref 0.0–0.5)
Eosinophils Relative: 2 %
HCT: 35.5 % — ABNORMAL LOW (ref 36.0–46.0)
Hemoglobin: 11.7 g/dL — ABNORMAL LOW (ref 12.0–15.0)
Immature Granulocytes: 0 %
Lymphocytes Relative: 31 %
Lymphs Abs: 1.5 10*3/uL (ref 0.7–4.0)
MCH: 29.1 pg (ref 26.0–34.0)
MCHC: 33 g/dL (ref 30.0–36.0)
MCV: 88.3 fL (ref 80.0–100.0)
Monocytes Absolute: 0.6 10*3/uL (ref 0.1–1.0)
Monocytes Relative: 11 %
Neutro Abs: 2.7 10*3/uL (ref 1.7–7.7)
Neutrophils Relative %: 55 %
Platelets: 249 10*3/uL (ref 150–400)
RBC: 4.02 MIL/uL (ref 3.87–5.11)
RDW: 11.8 % (ref 11.5–15.5)
WBC: 4.9 10*3/uL (ref 4.0–10.5)
nRBC: 0 % (ref 0.0–0.2)

## 2020-07-30 LAB — BRAIN NATRIURETIC PEPTIDE: B Natriuretic Peptide: 874.8 pg/mL — ABNORMAL HIGH (ref 0.0–100.0)

## 2020-07-30 MED ORDER — FUROSEMIDE 40 MG PO TABS
40.0000 mg | ORAL_TABLET | Freq: Every day | ORAL | 0 refills | Status: DC
Start: 2020-07-30 — End: 2023-11-21

## 2020-07-30 MED ORDER — AMOXICILLIN-POT CLAVULANATE 875-125 MG PO TABS
1.0000 | ORAL_TABLET | Freq: Two times a day (BID) | ORAL | 0 refills | Status: DC
Start: 2020-07-30 — End: 2021-03-22

## 2020-07-30 NOTE — Discharge Instructions (Addendum)
Your x ray showed heart failure and possible pneumonia Treating you with antibiotics and medicine to help with the fluid overload. This is called lasix.  Make sure that you are taking your blood pressure medicines as prescribed.  I recommend increasing the Losartan to 50 mg daily.  You need to see your doctor next week for recheck and repeat x ray.  For worsening symptoms please go to the ER.

## 2020-07-30 NOTE — ED Triage Notes (Signed)
Pt in with c/o SOB that has been going on for 1 week. States when she wakes up in the morning that she feels rattling in her chest and she can hear herself wheezing  Pt used her inhaler this morning with relief, states she also has bronchitis

## 2020-07-30 NOTE — ED Provider Notes (Signed)
[place]    CSN: [id number] Arrival date & time: 07/30/20  0272      History   Chief Complaint Chief Complaint  Patient presents with  . Shortness of Breath  . Asthma    HPI Cassie Mata is a 54 y.o. female.   Patient is a 54 year old female with past history of arthritis, asthma, depression, diabetes, fibromyalgia, GERD, hiatal hernia, insomnia, myalgia, stroke, hypertension.  She presents today with shortness of breath that has been going on for approximately 1 week.  Reporting this is constant.  She has been using her albuterol inhaler with some relief.  Feels like she gets this around the same time every year with bronchitis flare.  Can hear still wheezing.  Coughing up white phlegm.  No fevers, chills.  Low-grade fever today.  She is also has some off-and-on lower extremity swelling for the past month or so.  No history of DVT or PE.  No recent long distance traveling.  Patient very hypertensive today.  Reporting she has not taken her medicine since yesterday morning.      Past Medical History:  Diagnosis Date  . Arthritis   . Asthma   . Coma ([place])   . Depression   . Diabetes mellitus   . Fibromyalgia   . Gall stone   . Gastroesophageal reflux disease   . Hiatal hernia   . Insomnia 08/07/2014  . Myalgia and myositis, unspecified 01/13/2013  . Stroke [facility]) 2007   Pt. evaluated at [facility] had left sided weakness.  Reportedly no cerebral imaging ever done.  Records we have received from [facility] dated 2011 do not document this.  Rest of records in storage and have not yet sent.    Patient Active Problem List   Diagnosis Date Noted  . Diabetes mellitus type II, uncontrolled ([place]) 08/08/2015  . Essential hypertension 08/08/2015  . GERD (gastroesophageal reflux disease) 08/08/2015  . 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 Arthritis 08/08/2015  . Depression 06/15/2015  . Insomnia 08/07/2014  . Myalgia and myositis  01/13/2013    Past Surgical History:  Procedure Laterality Date  . CESAREAN SECTION  1987,1988,1996,2006    OB History   No obstetric history on file.      Home Medications    Prior to Admission medications   Medication Sig Start Date End Date Taking? Authorizing Provider  Truddie Crumble ULTRA-THIN LANCETS MISC Check sugars twice daily before meals 10/20/17   Mack Hook, MD  albuterol (PROVENTIL HFA;VENTOLIN HFA) 108 (90 Base) MCG/ACT inhaler Inhale 1-2 puffs into the lungs every 6 (six) hours as needed for wheezing. 10/20/17   Mack Hook, MD  amoxicillin-clavulanate (AUGMENTIN) 875-125 MG tablet Take 1 tablet by mouth every 12 (twelve) hours. 07/30/20   Loura Halt A, NP  aspirin 81 MG tablet Take 81 mg by mouth daily.     [provider]  benzonatate (TESSALON) 100 MG capsule Take 1-2 capsules (100-200 mg total) by mouth 3 (three) times daily as needed. 07/19/18   Jaynee Eagles, PA-C  Blood Glucose Monitoring Suppl (AGAMATRIX PRESTO) w/Device KIT Check blood sugars twice daily before meals 10/20/17   Mack Hook, MD  cholecalciferol (VITAMIN D) 1000 UNITS tablet Take 2,000 Units by mouth at bedtime.     [provider]  cyclobenzaprine (FLEXERIL) 10 MG tablet Take 1 tablet (10 mg total) by mouth 2 (two) times daily as needed for muscle spasms. 10/31/17   Shelda Pal, DO  diphenhydrAMINE (BENADRYL)  25 mg capsule Take 25 mg by mouth every 6 (six) hours as needed.    [provider]  Fluticasone-Salmeterol (ADVAIR) 250-50 MCG/DOSE AEPB 1 inhalation twice daily followed by brushing teeth and tongue 10/20/17   Mack Hook, MD  furosemide (LASIX) 40 MG tablet Take 1 tablet (40 mg total) by mouth daily for 5 days. 07/30/20 08/04/20  Loura Halt A, NP  glipiZIDE (GLUCOTROL) 5 MG tablet TAKE 1 TABLET BY MOUTH TWICE DAILY WITH MEALS 01/11/19   Mack Hook, MD  glucose blood (AGAMATRIX PRESTO TEST) test strip Check sugars twice  daily before meals 10/20/17   Mack Hook, MD  lidocaine (LIDODERM) 5 % Place 1 patch onto the skin daily. Remove & Discard patch within 12 hours or as directed by MD 04/15/20   Tacy Learn, PA-C  losartan (COZAAR) 25 MG tablet Take 1 tablet (25 mg total) by mouth daily for 5 days. 04/15/20 04/20/20  Tacy Learn, PA-C  metFORMIN (GLUCOPHAGE) 500 MG tablet Take 1 tablet by mouth twice daily 01/11/19   Mack Hook, MD  metoprolol tartrate (LOPRESSOR) 25 MG tablet Take 1/2 (one-half) tablet by mouth twice daily 01/11/19   Mack Hook, MD  Multiple Vitamin (MULTIVITAMIN) tablet Take 1 tablet by mouth daily.    [provider]  nitroGLYCERIN (NITROSTAT) 0.4 MG SL tablet Place 0.4 mg under the tongue every 5 (five) minutes as needed for chest pain.    [provider]  pantoprazole (PROTONIX) 40 MG tablet Take 1 tablet (40 mg total) by mouth daily. Reported on 09/13/2015 07/19/18   Jaynee Eagles, PA-C    Family History Family History  Problem Relation Age of Onset  . Diabetes Sister   . Alcohol abuse Sister   . Cirrhosis Sister   . Mental illness Son        suicidal thoughts  . ADD / ADHD Son   . Diabetes Father   . Cancer Father        Brain  . Cirrhosis Mother        alcoholic  . Mental illness Brother   . Arthritis Sister   . Cancer Sister        ?possible gastric cancer  . ADD / ADHD Son   . ADD / ADHD Son   . Mental illness Son        schizophrenia  . Blindness Son        Legal blindness    Social History Social History   Tobacco Use  . Smoking status: Former Smoker    Years: 17.00    Types: Cigarettes    Quit date: 08/03/1995    Years since quitting: 25.0  . Smokeless tobacco: Never Used  Substance Use Topics  . Alcohol use: No  . Drug use: No     Allergies   Gabapentin, Ivp dye [iodinated diagnostic agents], Lunesta [eszopiclone], Other, Prednisone, and Ultracet [tramadol-acetaminophen]   Review of Systems Review of  Systems   Physical Exam Triage Vital Signs ED Triage Vitals  Enc Vitals Group     BP 07/30/20 0818 (!) 181/110     Pulse Rate 07/30/20 0818 (!) 109     Resp 07/30/20 0818 (!) 21     Temp 07/30/20 0823 99 F (37.2 C)     Temp Source 07/30/20 0818 Oral     SpO2 07/30/20 0818 99 %     Weight --      Height --      Head Circumference --  Peak Flow --      Pain Score 07/30/20 0820 0     Pain Loc --      Pain Edu? --      Excl. in [place]? --    No data found.  Updated Vital Signs BP (!) 181/110 (BP Location: Right Arm)   Pulse (!) 109   Temp 99 F (37.2 C) (Oral)   Resp (!) 21   LMP 09/06/2015 (Exact Date)   SpO2 99%   Visual Acuity Right Eye Distance:   Left Eye Distance:   Bilateral Distance:    Right Eye Near:   Left Eye Near:    Bilateral Near:     Physical Exam Vitals and nursing note reviewed.  Constitutional:      General: She is not in acute distress.    Appearance: Normal appearance. She is not ill-appearing, toxic-appearing or diaphoretic.  HENT:     Head: Normocephalic.     Nose: Nose normal.  Eyes:     Conjunctiva/sclera: Conjunctivae normal.  Cardiovascular:     Rate and Rhythm: Regular rhythm. Tachycardia present.     Heart sounds: Normal heart sounds.  Pulmonary:     Effort: Pulmonary effort is normal.     Comments: Crackles in lung bases Musculoskeletal:        General: Normal range of motion.     Cervical back: Normal range of motion.     Right lower leg: Edema present.     Left lower leg: Edema present.     Comments: 1+  Skin:    General: Skin is warm and dry.     Findings: No rash.  Neurological:     Mental Status: She is alert.  Psychiatric:        Mood and Affect: Mood normal.      UC Treatments / Results  Labs (all labs ordered are listed, but only abnormal results are displayed) Labs Reviewed  CBC WITH DIFFERENTIAL/PLATELET  COMPREHENSIVE METABOLIC PANEL  BRAIN NATRIURETIC PEPTIDE    EKG   Radiology DG Chest 2  View  Result Date: 07/30/2020 CLINICAL DATA:  Shortness of breath, wheezing and leg swelling EXAM: CHEST - 2 VIEW COMPARISON:  September 30, 2013 FINDINGS: Cardiomediastinal contours mildly enlarged compared to the prior study. Engorgement of central pulmonary vasculature and LEFT and RIGHT hilum. Increased interstitial markings throughout the chest. Partially obscured RIGHT hemidiaphragm. No gross effusion. On limited assessment no acute skeletal process. IMPRESSION: Signs of potential heart failure or volume overload, background of viral or atypical process also considered. RIGHT lower lobe airspace disease suspicious for underlying lobar pneumonia given ill-defined airspace disease in this area. Would suggest follow-up PA and lateral chest to exclude underlying lesion given hilar fullness and somewhat nodular appearance of RIGHT lower lobe process. Electronically Signed   By: Zetta Bills M.D.   On: 07/30/2020 08:58    Procedures Procedures (including critical care time)  Medications Ordered in UC Medications - No data to display  Initial Impression / Assessment and Plan / UC Course  I have reviewed the triage vital signs and the nursing notes.  Pertinent labs & imaging results that were available during my care of the patient were reviewed by me and considered in my medical decision making (see chart for details).     Shortness of breath X-ray revealed acute congestive heart failure with fluid overload.  Also revealed possible right lower lobe pneumonia. Patient with crackles in lung bases.  She is not tachypneic or in  any respiratory distress.  Oxygen saturations are 99%.  Patient hypertensive but she did not take her blood pressure medicine today. Believe patient is stable for outpatient treatment at this time.  Will cover with antibiotics for potential pneumonia and start Lasix daily for 5 days for fluid overload. Checking CBC, CMP and BNP. Patient plans to follow-up with primary care  doctor after the holidays for recheck. Also recommend increasing her losartan to 50 mg daily.  For any worsening symptoms she will need to go to the ER. Pt understanding and agreed to plan.  Final Clinical Impressions(s) / UC Diagnoses   Final diagnoses:  Shortness of breath  Acute congestive heart failure, unspecified heart failure type ([place])  Pneumonia of right lower lobe due to infectious organism     Discharge Instructions     Your x ray showed heart failure and possible pneumonia Treating you with antibiotics and medicine to help with the fluid overload. This is called lasix.  Make sure that you are taking your blood pressure medicines as prescribed.  I recommend increasing the Losartan to 50 mg daily.  You need to see your doctor next week for recheck and repeat x ray.  For worsening symptoms please go to the ER.        ED Prescriptions    Medication Sig Dispense Auth. Provider   amoxicillin-clavulanate (AUGMENTIN) 875-125 MG tablet Take 1 tablet by mouth every 12 (twelve) hours. 14 tablet Bast, Traci A, NP   furosemide (LASIX) 40 MG tablet Take 1 tablet (40 mg total) by mouth daily for 5 days. 5 tablet Loura Halt A, NP     PDMP not reviewed this encounter.   Orvan July, NP 07/30/20 [phone]

## 2020-09-28 NOTE — Progress Notes (Signed)
[facility] Note  10/03/2020     CHIEF COMPLAINT Patient presents for Diabetic Eye Exam   HISTORY OF PRESENT ILLNESS: Cassie Mata is a 55 y.o. female who presents to the clinic today for:   HPI    Diabetic Eye Exam    Vision is blurred for near, is blurred for distance and fluctuates with blood sugars.  Associated Symptoms Floaters.  Diabetes characteristics include Type 2, controlled with diet and taking oral medications.  This started 18 years ago.  Blood sugar level fluctuates.  Last Blood Glucose: 1 day ago, but had been fluctuating between 95 and 200.Marland Kitchen  Last A1C: Unknown.  I, the attending physician,  performed the HPI with the patient and updated documentation appropriately.          Comments    55 y/o female pt referred by Dr. Frederico Hamman on 1.13.22 for eval of DR OU.  Pt noticed sudden onset blurred VA OU (worse OD) 2 mos ago.  OD has intermittent minor pain as well.  Pt has floaters OD.  Denies FOL.  No gtts.       Last edited by Bernarda Caffey, MD on 10/03/2020  1:12 PM. (History)    pt is here on the referral of Dr. Frederico Hamman for concern of DME OU, pt states she has been having problems with her right eye vision for about 2 months, her last A1c was 9.0 in December 2021, pt is taking metformin and glipizide, she states she does not like to take metformin bc it upsets her stomach, she feels like when she takes both medications together her blood sugar drops, pt has had 2 mini strokes, but was not hospitalized for them  Referring physician: Gevena Cotton, MD [place],  [place] [address]  HISTORICAL INFORMATION:   Selected notes from the [facility] Referred by Dr. Frederico Hamman for diabetic eval LEE:  Ocular Hx- PMH-    CURRENT MEDICATIONS: No current outpatient medications on file. (Ophthalmic Drugs)   No current facility-administered medications for this visit. (Ophthalmic Drugs)   Current Outpatient  Medications (Other)  Medication Sig  . AGAMATRIX ULTRA-THIN LANCETS MISC Check sugars twice daily before meals  . albuterol (PROVENTIL HFA;VENTOLIN HFA) 108 (90 Base) MCG/ACT inhaler Inhale 1-2 puffs into the lungs every 6 (six) hours as needed for wheezing.  Marland Kitchen amoxicillin-clavulanate (AUGMENTIN) 875-125 MG tablet Take 1 tablet by mouth every 12 (twelve) hours.  Marland Kitchen aspirin 81 MG tablet Take 81 mg by mouth daily.   . Aspirin-Calcium Carbonate 81-777 MG TABS Take by mouth.  . benzonatate (TESSALON) 100 MG capsule Take 1-2 capsules (100-200 mg total) by mouth 3 (three) times daily as needed.  . Blood Glucose Monitoring Suppl (AGAMATRIX PRESTO) w/Device KIT Check blood sugars twice daily before meals  . cholecalciferol (VITAMIN D) 1000 UNITS tablet Take 2,000 Units by mouth at bedtime.   . cyclobenzaprine (FLEXERIL) 10 MG tablet Take 1 tablet (10 mg total) by mouth 2 (two) times daily as needed for muscle spasms.  . dapagliflozin propanediol (FARXIGA) 5 MG TABS tablet Take by mouth.  . diclofenac (VOLTAREN) 75 MG EC tablet Take 75 mg by mouth 2 (two) times daily.  . diphenhydrAMINE (BENADRYL) 25 mg capsule Take 25 mg by mouth every 6 (six) hours as needed.  . DULoxetine HCl 40 MG CPEP Take 1 capsule by mouth daily.  . fluticasone (FLONASE) 50 MCG/ACT nasal spray 2 sprays by Each Nare route daily  for 30 days.  . Fluticasone-Salmeterol (ADVAIR) 250-50 MCG/DOSE AEPB 1 inhalation twice daily followed by brushing teeth and tongue  . glipiZIDE (GLUCOTROL) 5 MG tablet TAKE 1 TABLET BY MOUTH TWICE DAILY WITH MEALS  . glucose blood (AGAMATRIX PRESTO TEST) test strip Check sugars twice daily before meals  . ibuprofen (ADVIL) 200 MG tablet Take by mouth.  Marland Kitchen ipratropium (ATROVENT) 0.03 % nasal spray Place into the nose.  . lidocaine (LIDODERM) 5 % Place 1 patch onto the skin daily. Remove & Discard patch within 12 hours or as directed by MD  . losartan (COZAAR) 50 MG tablet Take 50 mg by mouth daily.  .  metFORMIN (GLUCOPHAGE) 500 MG tablet Take 1 tablet by mouth twice daily  . metoprolol tartrate (LOPRESSOR) 25 MG tablet Take 1/2 (one-half) tablet by mouth twice daily  . Multiple Vitamin (MULTIVITAMIN) tablet Take 1 tablet by mouth daily.  . nitroGLYCERIN (NITROSTAT) 0.4 MG SL tablet Place 0.4 mg under the tongue every 5 (five) minutes as needed for chest pain.  . pantoprazole (PROTONIX) 40 MG tablet Take 1 tablet (40 mg total) by mouth daily. Reported on 09/13/2015  . furosemide (LASIX) 40 MG tablet Take 1 tablet (40 mg total) by mouth daily for 5 days.  Marland Kitchen losartan (COZAAR) 25 MG tablet Take 1 tablet (25 mg total) by mouth daily for 5 days.   No current facility-administered medications for this visit. (Other)      REVIEW OF SYSTEMS: ROS    Positive for: Gastrointestinal, Neurological, Musculoskeletal, Endocrine, Eyes   Negative for: Constitutional, Skin, Genitourinary, HENT, Cardiovascular, Respiratory, Psychiatric, Allergic/Imm, Heme/Lymph   Last edited by Matthew Folks, COA on 10/03/2020  9:17 AM. (History)       ALLERGIES Allergies  Allergen Reactions  . Gabapentin   . Ivp Dye [Iodinated Diagnostic Agents] Nausea Only  . Lunesta [Eszopiclone]   . Other     "allergy medications"= makes heart flutter  . Prednisone Nausea Only    Elevates blood sugar  . Ultracet [Tramadol-Acetaminophen] Nausea And Vomiting    PAST MEDICAL HISTORY Past Medical History:  Diagnosis Date  . Arthritis   . Asthma   . Cataract    NS OU  . Coma ([place])   . Depression   . Diabetes mellitus   . Diabetic retinopathy ([place])    OU  . Fibromyalgia   . Gall stone   . Gastroesophageal reflux disease   . Hiatal hernia   . Insomnia 08/07/2014  . Myalgia and myositis, unspecified 01/13/2013  . Stroke [facility]) 2007   Pt. evaluated at [facility] had left sided weakness.  Reportedly no cerebral imaging ever done.  Records we have received from [facility] dated 2011 do not document this.   Rest of records in storage and have not yet sent.   Past Surgical History:  Procedure Laterality Date  . CESAREAN SECTION  [phone]    FAMILY HISTORY Family History  Problem Relation Age of Onset  . Diabetes Sister   . Alcohol abuse Sister   . Cirrhosis Sister   . Mental illness Son        suicidal thoughts  . ADD / ADHD Son   . Diabetes Father   . Cancer Father        Brain  . Cirrhosis Mother        alcoholic  . Mental illness Brother   . Arthritis Sister   . Cancer Sister        ?possible gastric cancer  .  ADD / ADHD Son   . ADD / ADHD Son   . Mental illness Son        schizophrenia  . Blindness Son        Legal blindness    SOCIAL HISTORY Social History   Tobacco Use  . Smoking status: Former Smoker    Years: 17.00    Types: Cigarettes    Quit date: 08/03/1995    Years since quitting: 25.1  . Smokeless tobacco: Never Used  Substance Use Topics  . Alcohol use: No  . Drug use: No         OPHTHALMIC EXAM:  Base Eye Exam    Visual Acuity (Snellen - Linear)      Right Left   Dist [place] 20/100 -2 20/40 -2   Dist ph [place] 20/80 -2 20/30 -2       Tonometry (Tonopen, 9:22 AM)      Right Left   Pressure 14 14       Pupils      Dark Light Shape React APD   Right 5 4 Round Brisk None   Left 5 4 Round Brisk None       Visual Fields (Counting fingers)      Left Right    Full Full       Extraocular Movement      Right Left    Full, Ortho Full, Ortho       Neuro/Psych    Oriented x3: Yes   Mood/Affect: Normal       Dilation    Both eyes: 1.0% Mydriacyl, 2.5% Phenylephrine @ 9:22 AM        Slit Lamp and Fundus Exam    Slit Lamp Exam      Right Left   Lids/Lashes Dermatochalasis - upper lid Dermatochalasis - upper lid   Conjunctiva/Sclera mild melanosis nasal and temporal pinguecula, Melanosis   Cornea 1+ Punctate epithelial erosions, mild tear film debris, mild arcus 2+ Punctate epithelial erosions, tear film debris   Anterior  Chamber Deep and quiet Deep and quiet   Iris Round and dilated Round and dilated   Lens 2-3+ Nuclear sclerosis with brunescence, 2+ Cortical cataract, 1+ Posterior subcapsular cataract 2-3+ Nuclear sclerosis with brunescence, 2+ Cortical cataract, 1+ Posterior subcapsular cataract   Vitreous Vitreous syneresis, scattered blood stained vitreous condensations Vitreous syneresis       Fundus Exam      Right Left   Disc Pink and Sharp, +NVD greatest superiorly, temporal PPA Pink and Sharp, Compact, early NVD and fibrosis   Macula Blunted foveal reflex, tractional edema centrally, scattered IRH Blunted foveal reflex, scattered MA/DBH, CWS SN mac, early fibrosis   Vessels attenuated, Tortuous, +NVE attenuated, Tortuous, +NV   Periphery hazy view, grossly attached, scattered DBH Attached, scattered MA/DBH, +MA           Refraction    Manifest Refraction      Sphere Cylinder Axis Dist VA   Right [place] Sphere  20/80-   Left -0.50 +0.25 015 20/30-          IMAGING AND PROCEDURES  Imaging and Procedures for 10/03/2020  OCT, Retina - OU - Both Eyes       Right Eye Quality was good. Central Foveal Thickness: 505. Progression has no prior data. Findings include abnormal foveal contour, intraretinal fluid, epiretinal membrane, macular pucker, vitreous traction (Vitreous opacities with tractional diabetic edema greatest superiorly).   Left Eye Quality was good. Central Foveal Thickness: 311. Progression  has no prior data. Findings include abnormal foveal contour, intraretinal fluid, no SRF, epiretinal membrane, vitreous traction (Vitreous opacities).   Notes *Images captured and stored on drive  Diagnosis / Impression:  +DME and tractional edema OU (OD>OS) +vitreous opacities OU (OD>OS)  Clinical management:  See below  Abbreviations: NFP - Normal foveal profile. CME - cystoid macular edema. PED - pigment epithelial detachment. IRF - intraretinal fluid. SRF - subretinal fluid. EZ -  ellipsoid zone. ERM - epiretinal membrane. ORA - outer retinal atrophy. ORT - outer retinal tubulation. SRHM - subretinal hyper-reflective material. IRHM - intraretinal hyper-reflective material        Fluorescein Angiography Optos (Transit OD)       Right Eye   Progression has no prior data. Early phase findings include vascular perfusion defect, microaneurysm, neovascularization disc, retinal neovascularization, leakage. Mid/Late phase findings include leakage, microaneurysm, neovascularization disc, retinal neovascularization, vascular perfusion defect.   Left Eye   Progression has no prior data. Early phase findings include vascular perfusion defect, microaneurysm, retinal neovascularization, neovascularization disc, leakage. Mid/Late phase findings include leakage, microaneurysm, neovascularization disc, retinal neovascularization, vascular perfusion defect.   Notes **Images stored on drive**  Impression: PDR OU NVD and NVE OU (OD>OS)                 ASSESSMENT/PLAN:    ICD-10-CM   1. Proliferative diabetic retinopathy of both eyes with macular edema associated with type 2 diabetes mellitus ([place])  V56.4332   2. Retinal edema  H35.81 OCT, Retina - OU - Both Eyes  3. Essential hypertension  I10   4. Hypertensive retinopathy of both eyes  H35.033 Fluorescein Angiography Optos (Transit OD)  5. Combined forms of age-related cataract of both eyes  H25.813     1,2. Proliferative diabetic retinopathy, OU (OD>OS)  - pt reports decreased vision OD x2 mos -- floaters / "spider webs"  - The incidence, risk factors for progression, natural history and treatment options for diabetic retinopathy were discussed with patient.    - The need for close monitoring of blood glucose, blood pressure, and serum lipids, avoiding cigarette or any type of tobacco, and the need for long term follow up was also discussed with patient.  - exam shows preretinal/vitreous and intraretinal hemes  OD, OS with +DBH; +fibrosis OU (OD > OS)  - FA today (1.26.22) shows NVD and NVE OU (OD > OS)  - OCT without diabetic macular edema, both eyes   - discussed findings, guarded prognosis and likely need of extensive treatment including intravitreal injections, laser, and possible surgery  - recommend IVA OD first today  - pt unable to proceed with treatment due to out-of-network insurance coverage here - will [facility] for in-network insurance coverage and management  3,4. Hypertensive retinopathy OU - discussed importance of tight BP control - monitor  5. Mixed Cataract OU - The symptoms of cataract, surgical options, and treatments and risks were discussed with patient. - discussed diagnosis and progression - not yet visually significant - monitor for now  Ophthalmic Meds Ordered this visit:  No orders of the defined types were placed in this encounter.     No follow-ups on file.  There are no Patient Instructions on file for this visit.   Explained the diagnoses, plan, and follow up with the patient and they expressed understanding.  Patient expressed understanding of the importance of proper follow up care.   This document serves as a record of services personally performed by  Gardiner Sleeper, MD, PhD. It was created on their behalf by Roselee Nova, COMT. The creation of this record is the provider's dictation and/or activities during the visit.  Electronically signed by: Roselee Nova, COMT 10/03/20 1:22 PM   This document serves as a record of services personally performed by Gardiner Sleeper, MD, PhD. It was created on their behalf by San Jetty. Owens Shark, OA an ophthalmic technician. The creation of this record is the provider's dictation and/or activities during the visit.    Electronically signed by: San Jetty. Owens Shark, [place] 01.26.2022 1:22 PM  Gardiner Sleeper, M.D., Ph.D. Diseases & Surgery of the Retina and Vitreous [facility]  I  have reviewed the above documentation for accuracy and completeness, and I agree with the above. Gardiner Sleeper, M.D., Ph.D. 10/03/20 1:22 PM  Abbreviations: M myopia (nearsighted); A astigmatism; H hyperopia (farsighted); P presbyopia; Mrx spectacle prescription;  CTL contact lenses; OD right eye; OS left eye; OU both eyes  XT exotropia; ET esotropia; PEK punctate epithelial keratitis; PEE punctate epithelial erosions; DES dry eye syndrome; MGD meibomian gland dysfunction; ATs artificial tears; PFAT's preservative free artificial tears; [place] nuclear sclerotic cataract; PSC posterior subcapsular cataract; ERM epi-retinal membrane; PVD posterior vitreous detachment; RD retinal detachment; DM diabetes mellitus; DR diabetic retinopathy; NPDR non-proliferative diabetic retinopathy; PDR proliferative diabetic retinopathy; CSME clinically significant macular edema; DME diabetic macular edema; dbh dot blot hemorrhages; CWS cotton wool spot; POAG primary open angle glaucoma; C/D cup-to-disc ratio; HVF humphrey visual field; GVF goldmann visual field; OCT optical coherence tomography; IOP intraocular pressure; BRVO Branch retinal vein occlusion; CRVO central retinal vein occlusion; CRAO central retinal artery occlusion; BRAO branch retinal artery occlusion; RT retinal tear; SB scleral buckle; PPV pars plana vitrectomy; VH Vitreous hemorrhage; PRP panretinal laser photocoagulation; IVK intravitreal kenalog; VMT vitreomacular traction; MH Macular hole;  NVD neovascularization of the disc; NVE neovascularization elsewhere; AREDS age related eye disease study; ARMD age related macular degeneration; POAG primary open angle glaucoma; EBMD epithelial/anterior basement membrane dystrophy; ACIOL anterior chamber intraocular lens; IOL intraocular lens; PCIOL posterior chamber intraocular lens; Phaco/IOL phacoemulsification with intraocular lens placement; [place] photorefractive keratectomy; LASIK laser assisted in situ keratomileusis;  HTN hypertension; DM diabetes mellitus; COPD chronic obstructive pulmonary disease

## 2020-10-03 ENCOUNTER — Ambulatory Visit (INDEPENDENT_AMBULATORY_CARE_PROVIDER_SITE_OTHER): Payer: BLUE CROSS/BLUE SHIELD | Admitting: Ophthalmology

## 2020-10-03 ENCOUNTER — Other Ambulatory Visit: Payer: Self-pay

## 2020-10-03 ENCOUNTER — Encounter (INDEPENDENT_AMBULATORY_CARE_PROVIDER_SITE_OTHER): Payer: Self-pay | Admitting: Ophthalmology

## 2020-10-03 DIAGNOSIS — E113513 Type 2 diabetes mellitus with proliferative diabetic retinopathy with macular edema, bilateral: Secondary | ICD-10-CM | POA: Diagnosis not present

## 2020-10-03 DIAGNOSIS — H25813 Combined forms of age-related cataract, bilateral: Secondary | ICD-10-CM

## 2020-10-03 DIAGNOSIS — H3581 Retinal edema: Secondary | ICD-10-CM

## 2020-10-03 DIAGNOSIS — I10 Essential (primary) hypertension: Secondary | ICD-10-CM

## 2020-10-03 DIAGNOSIS — H35033 Hypertensive retinopathy, bilateral: Secondary | ICD-10-CM

## 2020-10-18 DIAGNOSIS — E1136 Type 2 diabetes mellitus with diabetic cataract: Secondary | ICD-10-CM | POA: Insufficient documentation

## 2020-10-18 DIAGNOSIS — E113513 Type 2 diabetes mellitus with proliferative diabetic retinopathy with macular edema, bilateral: Secondary | ICD-10-CM | POA: Insufficient documentation

## 2020-11-08 DIAGNOSIS — F17201 Nicotine dependence, unspecified, in remission: Secondary | ICD-10-CM | POA: Insufficient documentation

## 2020-11-08 DIAGNOSIS — I5022 Chronic systolic (congestive) heart failure: Secondary | ICD-10-CM | POA: Insufficient documentation

## 2020-11-08 DIAGNOSIS — I639 Cerebral infarction, unspecified: Secondary | ICD-10-CM | POA: Insufficient documentation

## 2020-11-08 DIAGNOSIS — G4733 Obstructive sleep apnea (adult) (pediatric): Secondary | ICD-10-CM | POA: Insufficient documentation

## 2020-11-08 DIAGNOSIS — I679 Cerebrovascular disease, unspecified: Secondary | ICD-10-CM | POA: Insufficient documentation

## 2021-03-05 ENCOUNTER — Encounter (HOSPITAL_COMMUNITY): Payer: Self-pay | Admitting: Emergency Medicine

## 2021-03-05 ENCOUNTER — Ambulatory Visit (INDEPENDENT_AMBULATORY_CARE_PROVIDER_SITE_OTHER): Payer: Self-pay

## 2021-03-05 ENCOUNTER — Other Ambulatory Visit: Payer: Self-pay

## 2021-03-05 ENCOUNTER — Ambulatory Visit (HOSPITAL_COMMUNITY)
Admission: EM | Admit: 2021-03-05 | Discharge: 2021-03-05 | Disposition: A | Discharge status: Home / Self Care | Payer: Self-pay | Attending: Family Medicine | Admitting: Family Medicine

## 2021-03-05 DIAGNOSIS — S62307A Unspecified fracture of fifth metacarpal bone, left hand, initial encounter for closed fracture: Secondary | ICD-10-CM

## 2021-03-05 DIAGNOSIS — M79642 Pain in left hand: Secondary | ICD-10-CM

## 2021-03-05 MED ORDER — KETOROLAC TROMETHAMINE 30 MG/ML IJ SOLN
30.0000 mg | Freq: Once | INTRAMUSCULAR | Status: AC
  Administered 2021-03-05: 21:00:00 30 mg via INTRAMUSCULAR

## 2021-03-05 MED ORDER — KETOROLAC TROMETHAMINE 30 MG/ML IJ SOLN
INTRAMUSCULAR | Status: AC
  Filled 2021-03-05: qty 1

## 2021-03-05 NOTE — ED Notes (Signed)
[facility] called for patient

## 2021-03-05 NOTE — ED Provider Notes (Signed)
[place]    CSN: [id number] Arrival date & time: 03/05/21  1942      History   Chief Complaint Chief Complaint  Patient presents with   Hand Pain    Right     HPI Cassie Mata is a 55 y.o. female.   Presenting today with 1 day history of left hand pain and swelling after hitting her hand against her son's knee last night when they were joking around.  The area below her pinky finger is significantly swollen, tender, starting to bruise.  She is able to move all 5 fingers and not having any significant numbness, tingling.  So far has not tried anything over-the-counter for symptoms.   Past Medical History:  Diagnosis Date   Arthritis    Asthma    Cataract    NS OU   Coma ([place])    Depression    Diabetes mellitus    Diabetic retinopathy ([place])    OU   Fibromyalgia    Gall stone    Gastroesophageal reflux disease    Hiatal hernia    Insomnia 08/07/2014   Myalgia and myositis, unspecified 01/13/2013   Stroke ([place]) 2007   Pt. evaluated at [facility] had left sided weakness.  Reportedly no cerebral imaging ever done.  Records we have received from [facility] dated 2011 do not document this.  Rest of records in storage and have not yet sent.    Patient Active Problem List   Diagnosis Date Noted   Diabetes mellitus type II, uncontrolled ([place]) 08/08/2015   Essential hypertension 08/08/2015   GERD (gastroesophageal reflux disease) 08/08/2015   Chronic low back pain with sciatica 08/08/2015   Arthritis 08/08/2015   Depression 06/15/2015   Insomnia 08/07/2014   Myalgia and myositis 01/13/2013    Past Surgical History:  Procedure Laterality Date   CESAREAN SECTION  1987,1988,1996,2006    OB History   No obstetric history on file.      Home Medications    Prior to Admission medications   Medication Sig Start Date End Date Taking? Authorizing Provider  Truddie Crumble ULTRA-THIN LANCETS MISC Check sugars twice daily before meals 10/20/17    Mack Hook, MD  albuterol (PROVENTIL HFA;VENTOLIN HFA) 108 (90 Base) MCG/ACT inhaler Inhale 1-2 puffs into the lungs every 6 (six) hours as needed for wheezing. 10/20/17   Mack Hook, MD  amoxicillin-clavulanate (AUGMENTIN) 875-125 MG tablet Take 1 tablet by mouth every 12 (twelve) hours. 07/30/20   Loura Halt A, NP  aspirin 81 MG tablet Take 81 mg by mouth daily.     [provider]  Aspirin-Calcium Carbonate 81-777 MG TABS Take by mouth.    [provider]  benzonatate (TESSALON) 100 MG capsule Take 1-2 capsules (100-200 mg total) by mouth 3 (three) times daily as needed. 07/19/18   Jaynee Eagles, PA-C  Blood Glucose Monitoring Suppl (AGAMATRIX PRESTO) w/Device KIT Check blood sugars twice daily before meals 10/20/17   Mack Hook, MD  cholecalciferol (VITAMIN D) 1000 UNITS tablet Take 2,000 Units by mouth at bedtime.     [provider]  cyclobenzaprine (FLEXERIL) 10 MG tablet Take 1 tablet (10 mg total) by mouth 2 (two) times daily as needed for muscle spasms. 10/31/17   Shelda Pal, DO  dapagliflozin propanediol (FARXIGA) 5 MG TABS tablet Take by mouth. 08/09/20   [provider]  diclofenac (VOLTAREN) 75 MG EC tablet Take 75 mg by mouth 2 (two) times daily. 04/14/20   [provider]  diphenhydrAMINE (BENADRYL) 25 mg capsule Take 25 mg by mouth every 6 (six) hours as needed.    [provider]  DULoxetine HCl 40 MG CPEP Take 1 capsule by mouth daily. 04/14/20   [provider]  fluticasone (FLONASE) 50 MCG/ACT nasal spray 2 sprays by Each Nare route daily for 30 days. 02/28/20   [provider]  Fluticasone-Salmeterol (ADVAIR) 250-50 MCG/DOSE AEPB 1 inhalation twice daily followed by brushing teeth and tongue 10/20/17   Julieanne Manson, MD  furosemide (LASIX) 40 MG tablet Take 1 tablet (40 mg total) by mouth daily for 5 days. 07/30/20 08/04/20  Dahlia Byes A, NP  glipiZIDE (GLUCOTROL) 5 MG  tablet TAKE 1 TABLET BY MOUTH TWICE DAILY WITH MEALS 01/11/19   Julieanne Manson, MD  glucose blood (AGAMATRIX PRESTO TEST) test strip Check sugars twice daily before meals 10/20/17   Julieanne Manson, MD  ibuprofen (ADVIL) 200 MG tablet Take by mouth.    [provider]  ipratropium (ATROVENT) 0.03 % nasal spray Place into the nose. 02/23/20   [provider]  lidocaine (LIDODERM) 5 % Place 1 patch onto the skin daily. Remove & Discard patch within 12 hours or as directed by MD 04/15/20   Jeannie Fend, PA-C  losartan (COZAAR) 25 MG tablet Take 1 tablet (25 mg total) by mouth daily for 5 days. 04/15/20 04/20/20  Jeannie Fend, PA-C  losartan (COZAAR) 50 MG tablet Take 50 mg by mouth daily. 09/19/20   [provider]  metFORMIN (GLUCOPHAGE) 500 MG tablet Take 1 tablet by mouth twice daily 01/11/19   Julieanne Manson, MD  metoprolol tartrate (LOPRESSOR) 25 MG tablet Take 1/2 (one-half) tablet by mouth twice daily 01/11/19   Julieanne Manson, MD  Multiple Vitamin (MULTIVITAMIN) tablet Take 1 tablet by mouth daily.    [provider]  nitroGLYCERIN (NITROSTAT) 0.4 MG SL tablet Place 0.4 mg under the tongue every 5 (five) minutes as needed for chest pain.    [provider]  pantoprazole (PROTONIX) 40 MG tablet Take 1 tablet (40 mg total) by mouth daily. Reported on 09/13/2015 07/19/18   Wallis Bamberg, PA-C    Family History Family History  Problem Relation Age of Onset   Diabetes Sister    Alcohol abuse Sister    Cirrhosis Sister    Mental illness Son        suicidal thoughts   ADD / ADHD Son    Diabetes Father    Cancer Father        Brain   Cirrhosis Mother        alcoholic   Mental illness Brother    Arthritis Sister    Cancer Sister        ?possible gastric cancer   ADD / ADHD Son    ADD / ADHD Son    Mental illness Son        schizophrenia   Blindness Son        Legal blindness    Social History Social History   Tobacco Use    Smoking status: Former    Years: 17.00    Pack years: 0.00    Types: Cigarettes    Quit date: 08/03/1995    Years since quitting: 25.6   Smokeless tobacco: Never  Substance Use Topics   Alcohol use: No   Drug use: No     Allergies   Gabapentin, Ivp dye [iodinated diagnostic agents], Lunesta [eszopiclone], Other, Prednisone, and Ultracet [tramadol-acetaminophen]  Review of Systems Review of Systems Per HPI  Physical Exam Triage Vital Signs ED Triage Vitals  Enc Vitals Group     BP 03/05/21 2003 (!) 178/108     Pulse Rate 03/05/21 2003 (!) 106     Resp 03/05/21 2003 18     Temp 03/05/21 2003 99.8 F (37.7 C)     Temp src --      SpO2 03/05/21 2003 98 %     Weight --      Height --      Head Circumference --      Peak Flow --      Pain Score 03/05/21 2001 8     Pain Loc --      Pain Edu? --      Excl. in [place]? --    No data found.  Updated Vital Signs BP (!) 178/108 Comment: patient states that she didnt take BP medication today  Pulse (!) 106   Temp 99.8 F (37.7 C)   Resp 18   LMP 09/06/2015 (Exact Date)   SpO2 98%   Visual Acuity Right Eye Distance:   Left Eye Distance:   Bilateral Distance:    Right Eye Near:   Left Eye Near:    Bilateral Near:     Physical Exam Vitals and nursing note reviewed.  Constitutional:      Appearance: Normal appearance. She is not ill-appearing.  HENT:     Head: Atraumatic.  Eyes:     Extraocular Movements: Extraocular movements intact.     Conjunctiva/sclera: Conjunctivae normal.  Cardiovascular:     Rate and Rhythm: Normal rate and regular rhythm.     Heart sounds: Normal heart sounds.  Pulmonary:     Effort: Pulmonary effort is normal.     Breath sounds: Normal breath sounds.  Musculoskeletal:        General: Swelling, tenderness and signs of injury present. Normal range of motion.     Cervical back: Normal range of motion and neck supple.     Comments: Significant localized edema, tenderness to palpation.   Good range of motion of all 5 fingers of left hand  Skin:    General: Skin is warm and dry.  Neurological:     Mental Status: She is alert and oriented to person, place, and time.     Comments: Left upper extremity neurovascularly intact  Psychiatric:        Mood and Affect: Mood normal.        Thought Content: Thought content normal.        Judgment: Judgment normal.     UC Treatments / Results  Labs (all labs ordered are listed, but only abnormal results are displayed) Labs Reviewed - No data to display  EKG   Radiology DG Hand Complete Left  Result Date: 03/05/2021 CLINICAL DATA:  55 year old female with left hand swelling. EXAM: LEFT HAND - COMPLETE 3+ VIEW COMPARISON:  None. FINDINGS: Minimally displaced oblique fracture of the proximal fifth metacarpal. No other acute fracture. The bones are mildly osteopenic. There is no dislocation. Mild soft tissue swelling over the fifth metacarpal. No radiopaque foreign object or soft tissue gas. IMPRESSION: Minimally displaced oblique fracture of the proximal fifth metacarpal. Electronically Signed   By: Anner Crete M.D.   On: 03/05/2021 20:23    Procedures Procedures (including critical care time)  Medications Ordered in UC Medications  ketorolac (TORADOL) 30 MG/ML injection 30 mg (has no administration in time range)  Initial Impression / Assessment and Plan / UC Course  I have reviewed the triage vital signs and the nursing notes.  Pertinent labs & imaging results that were available during my care of the patient were reviewed by me and considered in my medical decision making (see chart for details).     X-ray showing displaced fracture of the fifth proximal metacarpal, ulnar gutter splint placed, IM Toradol given for pain relief, RICE protocol reviewed.  Follow-up with orthopedics in the next few days to a week for recheck.  Work note given.  Final Clinical Impressions(s) / UC Diagnoses   Final diagnoses:  Closed  displaced fracture of fifth metacarpal bone of left hand, unspecified portion of metacarpal, initial encounter     Discharge Instructions      Follow-up with orthopedics office of your choice in the next few days to a week     ED Prescriptions   None    PDMP not reviewed this encounter.   Volney American, [place] 03/05/21 2035

## 2021-03-05 NOTE — Discharge Instructions (Signed)
Follow-up with orthopedics office of your choice in the next few days to a week

## 2021-03-05 NOTE — ED Triage Notes (Signed)
Pt is present today with right hand pain and swelling. Pt states that she hit her hand last night and noticed some pain and swelling through the night

## 2021-03-05 NOTE — Progress Notes (Signed)
Orthopedic Tech Progress Note Patient Details:  Cassie Mata Jan 12, 1966 [id number]  Ortho Devices Type of Ortho Device: Arm sling, Ulna gutter splint Ortho Device/Splint Location: lue Ortho Device/Splint Interventions: Ordered, Application, Adjustment   Post Interventions Patient Tolerated: Well Instructions Provided: Care of device, Adjustment of device  Trinna Post 03/05/2021, 8:48 PM

## 2021-03-13 ENCOUNTER — Other Ambulatory Visit: Payer: Self-pay

## 2021-03-13 ENCOUNTER — Ambulatory Visit (INDEPENDENT_AMBULATORY_CARE_PROVIDER_SITE_OTHER): Payer: 59 | Admitting: Orthopaedic Surgery

## 2021-03-13 ENCOUNTER — Encounter: Payer: Self-pay | Admitting: Orthopaedic Surgery

## 2021-03-13 DIAGNOSIS — S62309A Unspecified fracture of unspecified metacarpal bone, initial encounter for closed fracture: Secondary | ICD-10-CM | POA: Insufficient documentation

## 2021-03-13 DIAGNOSIS — S62357A Nondisplaced fracture of shaft of fifth metacarpal bone, left hand, initial encounter for closed fracture: Secondary | ICD-10-CM

## 2021-03-13 MED ORDER — HYDROCODONE-ACETAMINOPHEN 5-325 MG PO TABS: 1.0000 | ORAL_TABLET | Freq: Four times a day (QID) | ORAL | 0 refills | Status: DC | PRN

## 2021-03-13 NOTE — Progress Notes (Signed)
Office Visit Note   Patient: Cassie Mata           Date of Birth: 06/04/1966           MRN: [id number] Visit Date: 03/13/2021              Requested by: No referring provider defined for this encounter. PCP: Pcp, No   Assessment & Plan: Visit Diagnoses:  1. Closed nondisplaced fracture of shaft of fifth metacarpal bone of left hand, initial encounter     Plan: Closed essentially nondisplaced oblique fracture of left little metacarpal shaft.  I reviewed the films.  We will reapply a new ulnar gutter splint and reevaluate in 1 week with new films.  Hydrocodone for pain.  No work  Follow-Up Instructions: Return in about 1 week (around 03/20/2021).   Orders:  No orders of the defined types were placed in this encounter.  No orders of the defined types were placed in this encounter.     Procedures: No procedures performed   Clinical Data: No additional findings.   Subjective: Chief Complaint  Patient presents with   Left Hand - Injury    DOI 03/04/2021  Patient presents today for left hand pain. She states that she was playing with her son and hit her hand on his knee. She had immediate pain. Her pain is located on the pinky side of her hand and radiates into her forearm. She went to urgent care and had x-rays taken. She was placed into a splint for a fifth metacarpal fracture. She has taken Tylenol if needed. She is right hand dominant.   HPI  Review of Systems   Objective: Vital Signs: Ht 5' (1.524 m)   Wt 151 lb (68.5 kg)   LMP 09/06/2015 (Exact Date)   BMI 29.49 kg/m   Physical Exam Constitutional:      Appearance: She is well-developed.  Eyes:     Pupils: Pupils are equal, round, and reactive to light.  Pulmonary:     Effort: Pulmonary effort is normal.  Skin:    General: Skin is warm and dry.  Neurological:     Mental Status: She is alert and oriented to person, place, and time.  Psychiatric:        Behavior: Behavior normal.    Ortho Exam  awake alert and oriented x3.  Comfortable sitting.  Left hand ulnar gutter splint was removed.  There is some swelling along the ulnar side of the hand but no digit swelling.  Neurologically intact.  No obvious deformity.  Still has some pain as expected over the metacarpal fracture.  Specialty Comments:  No specialty comments available.  Imaging: No results found.   PMFS History: Patient Active Problem List   Diagnosis Date Noted   Metacarpal bone fracture 03/13/2021   Diabetes mellitus type II, uncontrolled (HCC) 08/08/2015   Essential hypertension 08/08/2015   GERD (gastroesophageal reflux disease) 08/08/2015   Chronic low back pain with sciatica 08/08/2015   Arthritis 08/08/2015   Depression 06/15/2015   Insomnia 08/07/2014   Myalgia and myositis 01/13/2013   Past Medical History:  Diagnosis Date   Arthritis    Asthma    Cataract    NS OU   Coma ([facility])    Depression    Diabetes mellitus    Diabetic retinopathy ([facility])    OU   Fibromyalgia    Gall stone    Gastroesophageal reflux disease    Hiatal hernia    Insomnia 08/07/2014  Myalgia and myositis, unspecified 01/13/2013   Stroke [facility]) 2007   Pt. evaluated at [facility] had left sided weakness.  Reportedly no cerebral imaging ever done.  Records we have received from [facility] dated 2011 do not document this.  Rest of records in storage and have not yet sent.    Family History  Problem Relation Age of Onset   Diabetes Sister    Alcohol abuse Sister    Cirrhosis Sister    Mental illness Son        suicidal thoughts   ADD / ADHD Son    Diabetes Father    Cancer Father        Brain   Cirrhosis Mother        alcoholic   Mental illness Brother    Arthritis Sister    Cancer Sister        ?possible gastric cancer   ADD / ADHD Son    ADD / ADHD Son    Mental illness Son        schizophrenia   Blindness Son        Legal blindness    Past Surgical History:  Procedure Laterality Date    CESAREAN SECTION  1987,1988,1996,2006   Social History   Occupational History   Occupation: Scientist, research (medical): [employer]  Tobacco Use   Smoking status: Former    Years: 17.00    Pack years: 0.00    Types: Cigarettes    Quit date: 08/03/1995    Years since quitting: 25.6   Smokeless tobacco: Never  Substance and Sexual Activity   Alcohol use: No   Drug use: No   Sexual activity: Not on file

## 2021-03-21 ENCOUNTER — Ambulatory Visit: Payer: 59 | Admitting: Orthopaedic Surgery

## 2021-03-22 ENCOUNTER — Other Ambulatory Visit: Payer: Self-pay

## 2021-03-22 ENCOUNTER — Emergency Department (HOSPITAL_COMMUNITY): Payer: 59

## 2021-03-22 ENCOUNTER — Encounter (HOSPITAL_COMMUNITY): Payer: Self-pay | Admitting: Emergency Medicine

## 2021-03-22 ENCOUNTER — Observation Stay (HOSPITAL_COMMUNITY)
Admission: EM | Admit: 2021-03-22 | Discharge: 2021-03-24 | Disposition: A | Discharge status: Home / Self Care | Payer: 59 | Attending: Emergency Medicine | Admitting: Emergency Medicine

## 2021-03-22 DIAGNOSIS — Z7982 Long term (current) use of aspirin: Secondary | ICD-10-CM | POA: Insufficient documentation

## 2021-03-22 DIAGNOSIS — E1165 Type 2 diabetes mellitus with hyperglycemia: Secondary | ICD-10-CM | POA: Diagnosis present

## 2021-03-22 DIAGNOSIS — I130 Hypertensive heart and chronic kidney disease with heart failure and stage 1 through stage 4 chronic kidney disease, or unspecified chronic kidney disease: Secondary | ICD-10-CM | POA: Insufficient documentation

## 2021-03-22 DIAGNOSIS — Z7984 Long term (current) use of oral hypoglycemic drugs: Secondary | ICD-10-CM | POA: Diagnosis not present

## 2021-03-22 DIAGNOSIS — R1110 Vomiting, unspecified: Secondary | ICD-10-CM | POA: Diagnosis present

## 2021-03-22 DIAGNOSIS — I5030 Unspecified diastolic (congestive) heart failure: Secondary | ICD-10-CM | POA: Insufficient documentation

## 2021-03-22 DIAGNOSIS — M8889 Osteitis deformans of multiple sites: Secondary | ICD-10-CM | POA: Diagnosis not present

## 2021-03-22 DIAGNOSIS — N179 Acute kidney failure, unspecified: Secondary | ICD-10-CM | POA: Diagnosis present

## 2021-03-22 DIAGNOSIS — E1122 Type 2 diabetes mellitus with diabetic chronic kidney disease: Secondary | ICD-10-CM | POA: Diagnosis not present

## 2021-03-22 DIAGNOSIS — N1831 Chronic kidney disease, stage 3a: Secondary | ICD-10-CM | POA: Insufficient documentation

## 2021-03-22 DIAGNOSIS — Z79899 Other long term (current) drug therapy: Secondary | ICD-10-CM | POA: Diagnosis not present

## 2021-03-22 DIAGNOSIS — K5900 Constipation, unspecified: Principal | ICD-10-CM

## 2021-03-22 DIAGNOSIS — J45909 Unspecified asthma, uncomplicated: Secondary | ICD-10-CM | POA: Diagnosis not present

## 2021-03-22 DIAGNOSIS — R112 Nausea with vomiting, unspecified: Secondary | ICD-10-CM | POA: Diagnosis present

## 2021-03-22 DIAGNOSIS — Z20822 Contact with and (suspected) exposure to covid-19: Secondary | ICD-10-CM | POA: Diagnosis not present

## 2021-03-22 DIAGNOSIS — R109 Unspecified abdominal pain: Secondary | ICD-10-CM

## 2021-03-22 DIAGNOSIS — IMO0002 Reserved for concepts with insufficient information to code with codable children: Secondary | ICD-10-CM | POA: Diagnosis present

## 2021-03-22 HISTORY — DX: Heart failure, unspecified: I50.9

## 2021-03-22 LAB — COMPREHENSIVE METABOLIC PANEL
ALT: 43 U/L (ref 0–44)
AST: 55 U/L — ABNORMAL HIGH (ref 15–41)
Albumin: 3.1 g/dL — ABNORMAL LOW (ref 3.5–5.0)
Alkaline Phosphatase: 48 U/L (ref 38–126)
Anion gap: 10 (ref 5–15)
BUN: 43 mg/dL — ABNORMAL HIGH (ref 6–20)
CO2: 33 mmol/L — ABNORMAL HIGH (ref 22–32)
Calcium: 9.4 mg/dL (ref 8.9–10.3)
Chloride: 95 mmol/L — ABNORMAL LOW (ref 98–111)
Creatinine, Ser: 1.52 mg/dL — ABNORMAL HIGH (ref 0.44–1.00)
GFR, Estimated: 40 mL/min — ABNORMAL LOW (ref >60)
Glucose, Bld: 383 mg/dL — ABNORMAL HIGH (ref 70–99)
Potassium: 4.5 mmol/L (ref 3.5–5.1)
Sodium: 138 mmol/L (ref 135–145)
Total Bilirubin: 1.2 mg/dL (ref 0.3–1.2)
Total Protein: 7.9 g/dL (ref 6.5–8.1)

## 2021-03-22 LAB — RESP PANEL BY RT-PCR (FLU A&B, COVID) ARPGX2
Influenza A by PCR: NEGATIVE
Influenza B by PCR: NEGATIVE
SARS Coronavirus 2 by RT PCR: NEGATIVE

## 2021-03-22 LAB — CBC
HCT: 34.5 % — ABNORMAL LOW (ref 36.0–46.0)
HCT: 30.8 % — ABNORMAL LOW (ref 36.0–46.0)
Hemoglobin: 11.3 g/dL — ABNORMAL LOW (ref 12.0–15.0)
Hemoglobin: 10.1 g/dL — ABNORMAL LOW (ref 12.0–15.0)
MCH: 29 pg (ref 26.0–34.0)
MCH: 29.5 pg (ref 26.0–34.0)
MCHC: 32.8 g/dL (ref 30.0–36.0)
MCHC: 32.8 g/dL (ref 30.0–36.0)
MCV: 88.7 fL (ref 80.0–100.0)
MCV: 90.1 fL (ref 80.0–100.0)
Platelets: 303 10*3/uL (ref 150–400)
Platelets: 271 10*3/uL (ref 150–400)
RBC: 3.89 MIL/uL (ref 3.87–5.11)
RBC: 3.42 MIL/uL — ABNORMAL LOW (ref 3.87–5.11)
RDW: 13.1 % (ref 11.5–15.5)
RDW: 13 % (ref 11.5–15.5)
WBC: 5.8 10*3/uL (ref 4.0–10.5)
WBC: 7.1 10*3/uL (ref 4.0–10.5)
nRBC: 0 % (ref 0.0–0.2)
nRBC: 0 % (ref 0.0–0.2)

## 2021-03-22 LAB — URINALYSIS, ROUTINE W REFLEX MICROSCOPIC
Bilirubin Urine: NEGATIVE
Glucose, UA: >=500 mg/dL — AB
Hgb urine dipstick: NEGATIVE
Ketones, ur: 5 mg/dL — AB
Leukocytes,Ua: NEGATIVE
Nitrite: NEGATIVE
Protein, ur: 100 mg/dL — AB
Specific Gravity, Urine: 1.027 (ref 1.005–1.030)
pH: 6 (ref 5.0–8.0)

## 2021-03-22 LAB — CREATININE, SERUM
Creatinine, Ser: 1.46 mg/dL — ABNORMAL HIGH (ref 0.44–1.00)
GFR, Estimated: 42 mL/min — ABNORMAL LOW

## 2021-03-22 LAB — LIPASE, BLOOD: Lipase: 35 U/L (ref 11–51)

## 2021-03-22 LAB — PREGNANCY, URINE: Preg Test, Ur: NEGATIVE

## 2021-03-22 LAB — CBG MONITORING, ED: Glucose-Capillary: 325 mg/dL — ABNORMAL HIGH (ref 70–99)

## 2021-03-22 MED ORDER — SODIUM CHLORIDE 0.9 % IV BOLUS
500.0000 mL | Freq: Once | INTRAVENOUS | Status: AC
  Administered 2021-03-22: 500 mL via INTRAVENOUS

## 2021-03-22 MED ORDER — ASPIRIN 81 MG PO CHEW
81.0000 mg | CHEWABLE_TABLET | Freq: Every day | ORAL | Status: DC
  Administered 2021-03-23 – 2021-03-24 (×3): 81 mg via ORAL
  Filled 2021-03-22 (×3): qty 1

## 2021-03-22 MED ORDER — ACETAMINOPHEN 650 MG RE SUPP: 650.0000 mg | Freq: Four times a day (QID) | RECTAL | Status: DC | PRN

## 2021-03-22 MED ORDER — ACETAMINOPHEN 325 MG PO TABS: 650.0000 mg | ORAL_TABLET | Freq: Four times a day (QID) | ORAL | Status: DC | PRN

## 2021-03-22 MED ORDER — LOSARTAN POTASSIUM 50 MG PO TABS: 50.0000 mg | ORAL_TABLET | Freq: Every day | ORAL | Status: DC

## 2021-03-22 MED ORDER — GLIPIZIDE 5 MG PO TABS
5.0000 mg | ORAL_TABLET | Freq: Every day | ORAL | Status: DC
  Administered 2021-03-23 – 2021-03-24 (×2): 5 mg via ORAL
  Filled 2021-03-22 (×2): qty 1

## 2021-03-22 MED ORDER — METOPROLOL TARTRATE 25 MG PO TABS
25.0000 mg | ORAL_TABLET | Freq: Every day | ORAL | Status: DC
  Administered 2021-03-23: 25 mg via ORAL
  Filled 2021-03-22: qty 1

## 2021-03-22 MED ORDER — DULOXETINE HCL 20 MG PO CPEP
40.0000 mg | ORAL_CAPSULE | Freq: Every day | ORAL | Status: DC
  Filled 2021-03-22: qty 2

## 2021-03-22 MED ORDER — LOSARTAN POTASSIUM 25 MG PO TABS
25.0000 mg | ORAL_TABLET | Freq: Every day | ORAL | Status: DC
  Administered 2021-03-23 – 2021-03-24 (×3): 25 mg via ORAL
  Filled 2021-03-22 (×3): qty 1

## 2021-03-22 MED ORDER — FLUTICASONE FUROATE-VILANTEROL 200-25 MCG/INH IN AEPB
1.0000 | INHALATION_SPRAY | Freq: Every day | RESPIRATORY_TRACT | Status: DC
  Filled 2021-03-22 (×2): qty 28

## 2021-03-22 MED ORDER — ATORVASTATIN CALCIUM 80 MG PO TABS
80.0000 mg | ORAL_TABLET | Freq: Every day | ORAL | Status: DC
  Administered 2021-03-23 – 2021-03-24 (×2): 80 mg via ORAL
  Filled 2021-03-22 (×2): qty 1

## 2021-03-22 MED ORDER — ONDANSETRON HCL 4 MG/2ML IJ SOLN
2.0000 mg | Freq: Once | INTRAMUSCULAR | Status: AC
  Administered 2021-03-22: 2 mg via INTRAVENOUS
  Filled 2021-03-22: qty 2

## 2021-03-22 MED ORDER — ALBUTEROL SULFATE HFA 108 (90 BASE) MCG/ACT IN AERS
1.0000 | INHALATION_SPRAY | Freq: Four times a day (QID) | RESPIRATORY_TRACT | Status: DC | PRN
Start: 2021-03-22 — End: 2021-03-24
  Filled 2021-03-22: qty 6.7

## 2021-03-22 MED ORDER — ENOXAPARIN SODIUM 40 MG/0.4ML IJ SOSY
40.0000 mg | PREFILLED_SYRINGE | INTRAMUSCULAR | Status: DC
  Administered 2021-03-22 – 2021-03-23 (×2): 40 mg via SUBCUTANEOUS
  Filled 2021-03-22 (×2): qty 0.4

## 2021-03-22 MED ORDER — PANTOPRAZOLE SODIUM 40 MG IV SOLR
40.0000 mg | Freq: Once | INTRAVENOUS | Status: AC
  Administered 2021-03-22: 40 mg via INTRAVENOUS
  Filled 2021-03-22: qty 40

## 2021-03-22 MED ORDER — MORPHINE SULFATE (PF) 4 MG/ML IV SOLN
4.0000 mg | Freq: Once | INTRAVENOUS | Status: DC
  Filled 2021-03-22: qty 1

## 2021-03-22 NOTE — ED Provider Notes (Signed)
This patient was signed out to me by Harlene Salts, PA-C.  Please see his documentation for further details.  Briefly, this is a 55 year old female with N/V and constipation x3 days with small bowel movement yesterday.  CT abdomen/pelvis without contrast without any evidence of obstruction.  [place] RUQ with moderate cholelithiasis but no evidence of acute cholecystitis.  LFT's unremarkable.  Metabolic panel shows AKI, bicarb elevated but normal ag, unlikely DKA.  Plan: Admit for AKI pending UA (r/o infection/ketones)  Physical Exam  BP (!) 164/96 (BP Location: Left Arm)   Pulse (!) 102   Temp 98.5 F (36.9 C) (Oral)   Resp 18   Ht 5' (1.524 m)   Wt 59 kg   LMP 09/06/2015 (Exact Date)   SpO2 98%   BMI 25.39 kg/m   Physical Exam Vitals and nursing note reviewed.  Constitutional:      General: She is not in acute distress.    Appearance: She is well-developed.  HENT:     Head: Normocephalic and atraumatic.  Eyes:     Conjunctiva/sclera: Conjunctivae normal.  Cardiovascular:     Rate and Rhythm: Regular rhythm. Tachycardia present.  Pulmonary:     Effort: Pulmonary effort is normal. No respiratory distress.  Skin:    General: Skin is warm and dry.  Neurological:     Mental Status: She is alert and oriented to person, place, and time. Mental status is at baseline.  Psychiatric:        Mood and Affect: Mood normal.        Behavior: Behavior normal.    ED Course/Procedures     Procedures  MDM   Patient was reevaluated after signout, HDS, no acute concerns.  All studies independently reviewed by myself, d/w the attending physician, factored into my MDM. -Prior labs reviewed as above -UA noninfected appearing  Patient was updated on results.  Hospitalist service contacted and agreed to admit the patient.  Patient was HDS on reevaluation, nontoxic appearing, subsequently admitted.  1. AKI (acute kidney injury) (HCC)   2. Abdominal pain   3. Constipation, unspecified  constipation type       Colvin Caroli, MD 03/23/21 [id number]    Cheryll Cockayne, MD 03/31/21 1055

## 2021-03-22 NOTE — ED Triage Notes (Signed)
Pt reports n/v and constipation that started about 3 days ago. Pt denies abd pain at this time. Pt reports small BM yesterday. Pt reports unable to keep anything down. Pt reports she ate some expired vinegar a few days ago.

## 2021-03-22 NOTE — H&P (Addendum)
Date: 03/22/2021               Patient Name:  Cassie Mata MRN: [id number]  DOB: 1966/05/12 Age / Sex: 55 y.o., female   PCP: Pcp, No         Medical Service: Internal Medicine Teaching Service         Attending Physician: Dr. Sid Falcon, MD    First Contact: Dr. Lorin Glass Pager: [phone]  Second Contact: Dr. Laural Golden Pager: [phone]       After Hours (After 5p/  First Contact Pager: [phone]  weekends / holidays): Second Contact Pager: [phone]   Chief Complaint: Nausea and constipation  History of Present Illness: Cassie Mata is a 55 year old with past medical history of Heart failure with Biventricular failure, hypertension, uncontrolled type 2 DM, OSA, CVA and GERD.  She presents with nausea, vomiting, and constipation for the past three days.  She states it started soon after she took some newly prescribed Norco for treatment of her broken wrist.  Her last bowel movement was yesterday, described as small and hard stool.  Besides today in the ER, she states that she has been eating and drinking normally.    Patient has had nausea, vomiting, and constipation. Denies chest pain, abdominal pain, dysuria, or increased fatigue.  Meds:  Current Meds  Medication Sig   AGAMATRIX ULTRA-THIN LANCETS MISC Check sugars twice daily before meals   albuterol (PROVENTIL HFA;VENTOLIN HFA) 108 (90 Base) MCG/ACT inhaler Inhale 1-2 puffs into the lungs every 6 (six) hours as needed for wheezing.   aspirin 81 MG tablet Take 81 mg by mouth daily.    atorvastatin (LIPITOR) 80 MG tablet Take 80 mg by mouth daily.   Blood Glucose Monitoring Suppl (AGAMATRIX PRESTO) w/Device KIT Check blood sugars twice daily before meals   cholecalciferol (VITAMIN D) 1000 UNITS tablet Take 2,000 Units by mouth at bedtime.    diphenhydrAMINE (BENADRYL) 25 mg capsule Take 25 mg by mouth every 6 (six) hours as needed for allergies.   Fluticasone-Salmeterol (ADVAIR) 250-50 MCG/DOSE AEPB 1 inhalation twice daily  followed by brushing teeth and tongue (Patient taking differently: Inhale 1 puff into the lungs 2 (two) times daily. 1 inhalation twice daily followed by brushing teeth and tongue)   glipiZIDE (GLUCOTROL) 5 MG tablet TAKE 1 TABLET BY MOUTH TWICE DAILY WITH MEALS (Patient taking differently: Take 5 mg by mouth 2 (two) times daily before a meal. TAKE 1 TABLET BY MOUTH TWICE DAILY WITH MEALS)   glucose blood (AGAMATRIX PRESTO TEST) test strip Check sugars twice daily before meals   HYDROcodone-acetaminophen (NORCO/VICODIN) 5-325 MG tablet Take 1 tablet by mouth every 6 (six) hours as needed for moderate pain.   ibuprofen (ADVIL) 200 MG tablet Take 200 mg by mouth daily as needed for headache or mild pain.   metoprolol tartrate (LOPRESSOR) 25 MG tablet Take 1/2 (one-half) tablet by mouth twice daily (Patient taking differently: Take 25 mg by mouth 2 (two) times daily.)   Multiple Vitamin (MULTIVITAMIN) tablet Take 1 tablet by mouth daily.   nitroGLYCERIN (NITROSTAT) 0.4 MG SL tablet Place 0.4 mg under the tongue every 5 (five) minutes as needed for chest pain.   pantoprazole (PROTONIX) 40 MG tablet Take 1 tablet (40 mg total) by mouth daily. Reported on 09/13/2015   [DISCONTINUED] losartan (COZAAR) 25 MG tablet Take 1 tablet (25 mg total) by mouth daily for 5 days.     Allergies: Allergies as of 03/22/2021 -  Review Complete 03/22/2021  Allergen Reaction Noted   Eszopiclone  08/07/2014   Gabapentin Other (See Comments) 12/20/2019   Ivp dye [iodinated diagnostic agents] Nausea Only 02/22/2012   Other  09/29/2011   Prednisone Other (See Comments) 09/29/2011   Ultracet [tramadol-acetaminophen] Nausea And Vomiting 09/29/2011   Past Medical History:  Diagnosis Date   Arthritis    Asthma    Cataract    NS OU   CHF (congestive heart failure) ([place])    Coma ([place])    Depression    Diabetes mellitus    Diabetic retinopathy ([place])    OU   Fibromyalgia    Gall stone    Gastroesophageal reflux disease     Hiatal hernia    Insomnia 08/07/2014   Myalgia and myositis, unspecified 01/13/2013   Stroke ([place]) 2007   Pt. evaluated at [facility] had left sided weakness.  Reportedly no cerebral imaging ever done.  Records we have received from [facility] dated 2011 do not document this.  Rest of records in storage and have not yet sent.    Family History:  Family History  Problem Relation Age of Onset   Diabetes Sister    Alcohol abuse Sister    Cirrhosis Sister    Mental illness Son        suicidal thoughts   ADD / ADHD Son    Diabetes Father    Cancer Father        Brain   Cirrhosis Mother        alcoholic   Mental illness Brother    Arthritis Sister    Cancer Sister        ?possible gastric cancer   ADD / ADHD Son    ADD / ADHD Son    Mental illness Son        schizophrenia   Blindness Son        Legal blindness     Social History: Denies tobacco, drug and EtOH use. She lives alone in [place].  Review of Systems: A complete ROS was negative except as per HPI.   Physical Exam: Blood pressure (!) 164/97, pulse (!) 102, temperature 98.5 F (36.9 C), temperature source Oral, resp. rate 18, height 5' (1.524 m), weight 59 kg, last menstrual period 09/06/2015, SpO2 100 %. GEN: well-developed, looks mildly uncomfortable HENT: HENT, mucous membranes moist Eyes: no scleral icterus, conjunctiva clear Heart: No murmurs, heart beat fast LUNGS: CTAB, normal pulmonary effort Abd: Bowel sounds heard in all four quadrants, no tenderness Extrm: no peripheral edema, pulses present bilaterally Skin: warm and dry, no rashes Psych: Oriented x3, mood and affect normal  EKG: sinus tachycardia at rate of 103 CT [place]:  IMPRESSION: Moderate cholelithiasis without additional sonographic evidence of acute cholecystitis. CT abd/pelvis:  IMPRESSION: 1. No acute findings in the abdomen or pelvis. Specifically, no findings to explain the patient's history of nausea and  vomiting. 2. Small to moderate stool volume. Clinical constipation is a possibility. 3. Cholelithiasis. 4. 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 5. Aortic Atherosclerosis (ICD10-I70.0).   Assessment & Plan by Problem: Active Problems:   Vomiting in adult   AKI (acute kidney injury) ([place])  70 yof with NICM (EF 15-20%), uncontrolled diabetes and hypertension who was admitted on 03/22/21 for AKI in the setting of 3d hx of vomiting.   Acute kidney injury on CKD 3a secondary to diabetic nephropathy Suspect vomiting, poor oral intake were the exacerbating factors Creatinine on presentation at 1.52-->1.46 with baseline of  1.19 . GFR= 40. Admission labs consistent with pre-renal etiology. Received 1.5L NS in the ED May 2022 urine albumin/creatinine 1040, UA with proteinuria Plan -will hold off on further IV volume repletion in the setting of heart failure. Encourage po fluids -repeat labs in AM  Vomiting in adult, constipation SBO r/o on CT. GI sx likely multifactoral--may have some underlying gastroparesis from uncontrolled DM that was exacerbated by opioids she has been taking for left fifth metacarpal in June/22. -ADAT -stop opioids  Hyperglycemia in setting of Uncontrolled Type 2 DM complicated by diabetic nephropathy, retinopathy with macular edema, and neuropathy. Glucose elevated at 383 on presentation to ED, with elevated Bicarb (33), but normal Anion gap, UA showed glucosuria and proteinuria.  Unlikely to be DKA or HHS. Last A1C in May was >15. GAD and C-peptide in 05/22 wnl Home medications: glipizide 10 mg BID only for DM.  She is supposed to be on farxiga and TROJEO, but has had difficulty with access to medicines due to insurance and finances.  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  She stopped metformin due to difficulty tolerating GI side effects. Follows with endocrinology at [facility] -start lantus 10U daily and SSI -continue glipizide -would benefit from SGLT2  for glycemic management in addition to heart failure and CKD  Paget's disease right iliac bone Seen on CT abd/ Pelvis 03/22/21.  Not previously mentioned in PCP, cardiology, or other outpatient notes.  Patient has CKD 3a, this finding could be concerning for renal osteodystrophy vs Paget's disease.  Patients takes Cholecalciferol 2000 units QD at home. -Ca= 9.4, Alkaline phosphatase= 48, GFR=40  -will consider ordering phosphorous, PTH for further workup.   Chronic conditions:  Asymptomatic Normocytic Anemia At 11.3-->10.1 in ED with MCV 90.1. Not iron deficient.  -continue to monitor  Poorly controlled hypertension -resume home meds of metoprolol tartrate 25mg  twice daily, losartan 25mg  daily  BV heart failure 10/2020 echo: LVEF 15-20% Follows with Dr. Charolette Child with the heart failure team at [facility], is due for an ECHO to evaluated for possible ICD placement. GDMT includes metoprolol and losartan. Takes lasix prn. Not currently taking imdur. Required lower dose after developing dizziness. Developed dizziness on lasix. Suspect she has a component of autonomic dysfunction from DM -continue home medications -monitor volume status, strict I/O -would benefit from SGLT2   Non-obstructive CAD -continue aspirin and statin   Untreated OSA She was using a CPAP, however does not currently have one that works at home. Will need to work on this with her PCP after discharge.   Asthma -prn albuterol and breo  Hx of stroke 2006 and 2007 with residual left sided weakness -continue aspirin and statin  Fibromyalgia--continue duloxetine  PCP needs. After recent changes in her insurance, she notes that she is no longer established with a PCP. Would consider asking her to follow up in the [facility] after discharge to establish.  Diet: renal VTE: Lovenox Code: Full IVF: none Dispo: Admit patient to Observation with expected length of stay less than 2 midnights.  Signed: Christiana Fuchs, DO 03/22/2021, 11:34  PM  Pager: [phone] After 5pm on weekdays and 1pm on weekends: On Call pager: [phone]

## 2021-03-22 NOTE — ED Provider Notes (Signed)
[facility] EMERGENCY DEPARTMENT Provider Note   CSN: [id number] Arrival date & time: 03/22/21  1028     History Chief Complaint  Patient presents with   Vomiting   Constipation    Cassie Mata is a 55 y.o. female history of CHF, diabetes, GERD, CVA.  Patient presents today for nausea vomiting constipation onset 3 days ago.  Patient reports that she ate out at a restaurant and had some old vinegar, symptoms began after that.  She reports that whenever she tries to eat or drink she has nonbloody/nonbilious emesis and nausea, this is not associated with abdominal pain.  She also reports difficulty with bowel movements over the past 2 days she reports that she has small hard stools last BM was this morning.  Denies fever/chills, fall/injury, chest pain/shortness of breath, cough/hemoptysis, extremity swelling/color change, dysuria/hematuria or any additional concerns  HPI     Past Medical History:  Diagnosis Date   Arthritis    Asthma    Cataract    NS OU   CHF (congestive heart failure) (H[facility])    Coma ([place])    Depression    Diabetes mellitus    Diabetic retinopathy ([place])    OU   Fibromyalgia    Gall stone    Gastroesophageal reflux disease    Hiatal hernia    Insomnia 08/07/2014   Myalgia and myositis, unspecified 01/13/2013   Stroke ([place]) 2007   Pt. evaluated at [facility] had left sided weakness.  Reportedly no cerebral imaging ever done.  Records we have received from [facility] dated 2011 do not document this.  Rest of records in storage and have not yet sent.    Patient Active Problem List   Diagnosis Date Noted   Metacarpal bone fracture 03/13/2021   Diabetes mellitus type II, uncontrolled ([place]) 08/08/2015   Essential hypertension 08/08/2015   GERD (gastroesophageal reflux disease) 08/08/2015   Chronic low back pain with sciatica 08/08/2015   Arthritis 08/08/2015   Depression 06/15/2015   Insomnia 08/07/2014   Myalgia  and myositis 01/13/2013    Past Surgical History:  Procedure Laterality Date   CESAREAN SECTION  1987,1988,1996,2006     OB History   No obstetric history on file.     Family History  Problem Relation Age of Onset   Diabetes Sister    Alcohol abuse Sister    Cirrhosis Sister    Mental illness Son        suicidal thoughts   ADD / ADHD Son    Diabetes Father    Cancer Father        Brain   Cirrhosis Mother        alcoholic   Mental illness Brother    Arthritis Sister    Cancer Sister        ?possible gastric cancer   ADD / ADHD Son    ADD / ADHD Son    Mental illness Son        schizophrenia   Blindness Son        Legal blindness    Social History   Tobacco Use   Smoking status: Former    Years: 17.00    Types: Cigarettes    Quit date: 08/03/1995    Years since quitting: 25.6   Smokeless tobacco: Never  Substance Use Topics   Alcohol use: No   Drug use: No    Home Medications Prior to Admission medications   Medication Sig Start Date End  Date Taking? Authorizing Provider  Truddie Crumble ULTRA-THIN LANCETS MISC Check sugars twice daily before meals 10/20/17   Mack Hook, MD  albuterol (PROVENTIL HFA;VENTOLIN HFA) 108 (90 Base) MCG/ACT inhaler Inhale 1-2 puffs into the lungs every 6 (six) hours as needed for wheezing. 10/20/17   Mack Hook, MD  amoxicillin-clavulanate (AUGMENTIN) 875-125 MG tablet Take 1 tablet by mouth every 12 (twelve) hours. 07/30/20   Loura Halt A, NP  aspirin 81 MG tablet Take 81 mg by mouth daily.     [provider]  Aspirin-Calcium Carbonate 81-777 MG TABS Take by mouth.    [provider]  benzonatate (TESSALON) 100 MG capsule Take 1-2 capsules (100-200 mg total) by mouth 3 (three) times daily as needed. 07/19/18   Jaynee Eagles, PA-C  Blood Glucose Monitoring Suppl (AGAMATRIX PRESTO) w/Device KIT Check blood sugars twice daily before meals 10/20/17   Mack Hook, MD  cholecalciferol (VITAMIN D)  1000 UNITS tablet Take 2,000 Units by mouth at bedtime.     [provider]  cyclobenzaprine (FLEXERIL) 10 MG tablet Take 1 tablet (10 mg total) by mouth 2 (two) times daily as needed for muscle spasms. 10/31/17   Shelda Pal, DO  dapagliflozin propanediol (FARXIGA) 5 MG TABS tablet Take by mouth. 08/09/20   [provider]  diclofenac (VOLTAREN) 75 MG EC tablet Take 75 mg by mouth 2 (two) times daily. 04/14/20   [provider]  diphenhydrAMINE (BENADRYL) 25 mg capsule Take 25 mg by mouth every 6 (six) hours as needed.    [provider]  DULoxetine HCl 40 MG CPEP Take 1 capsule by mouth daily. 04/14/20   [provider]  fluticasone (FLONASE) 50 MCG/ACT nasal spray 2 sprays by Each Nare route daily for 30 days. 02/28/20   [provider]  Fluticasone-Salmeterol (ADVAIR) 250-50 MCG/DOSE AEPB 1 inhalation twice daily followed by brushing teeth and tongue 10/20/17   Mack Hook, MD  furosemide (LASIX) 40 MG tablet Take 1 tablet (40 mg total) by mouth daily for 5 days. 07/30/20 08/04/20  Loura Halt A, NP  glipiZIDE (GLUCOTROL) 5 MG tablet TAKE 1 TABLET BY MOUTH TWICE DAILY WITH MEALS 01/11/19   Mack Hook, MD  glucose blood (AGAMATRIX PRESTO TEST) test strip Check sugars twice daily before meals 10/20/17   Mack Hook, MD  HYDROcodone-acetaminophen (NORCO/VICODIN) 5-325 MG tablet Take 1 tablet by mouth every 6 (six) hours as needed for moderate pain. 03/13/21   Garald Balding, MD  ibuprofen (ADVIL) 200 MG tablet Take by mouth.    [provider]  ipratropium (ATROVENT) 0.03 % nasal spray Place into the nose. 02/23/20   [provider]  lidocaine (LIDODERM) 5 % Place 1 patch onto the skin daily. Remove & Discard patch within 12 hours or as directed by MD 04/15/20   Tacy Learn, PA-C  losartan (COZAAR) 25 MG tablet Take 1 tablet (25 mg total) by mouth daily for 5 days. 04/15/20 04/20/20  Tacy Learn,  PA-C  losartan (COZAAR) 50 MG tablet Take 50 mg by mouth daily. 09/19/20   [provider]  metFORMIN (GLUCOPHAGE) 500 MG tablet Take 1 tablet by mouth twice daily 01/11/19   Mack Hook, MD  metoprolol tartrate (LOPRESSOR) 25 MG tablet Take 1/2 (one-half) tablet by mouth twice daily 01/11/19   Mack Hook, MD  Multiple Vitamin (MULTIVITAMIN) tablet Take 1 tablet by mouth daily.    [provider]  nitroGLYCERIN (NITROSTAT) 0.4 MG SL tablet Place 0.4 mg under the  tongue every 5 (five) minutes as needed for chest pain.    [provider]  pantoprazole (PROTONIX) 40 MG tablet Take 1 tablet (40 mg total) by mouth daily. Reported on 09/13/2015 07/19/18   Jaynee Eagles, PA-C    Allergies    Gabapentin, Ivp dye [iodinated diagnostic agents], Lunesta [eszopiclone], Other, Prednisone, and Ultracet [tramadol-acetaminophen]  Review of Systems   Review of Systems Ten systems are reviewed and are negative for acute change except as noted in the HPI  Physical Exam Updated Vital Signs BP 114/83   Pulse (!) 104   Temp 98.5 F (36.9 C) (Oral)   Resp 16   Ht 5' (1.524 m)   Wt 59 kg   LMP 09/06/2015 (Exact Date)   SpO2 97%   BMI 25.39 kg/m   Physical Exam Constitutional:      General: She is not in acute distress.    Appearance: Normal appearance. She is well-developed. She is not ill-appearing or diaphoretic.  HENT:     Head: Normocephalic and atraumatic.  Eyes:     General: Vision grossly intact. Gaze aligned appropriately.     Pupils: Pupils are equal, round, and reactive to light.  Neck:     Trachea: Trachea and phonation normal.  Pulmonary:     Effort: Pulmonary effort is normal. No respiratory distress.  Abdominal:     General: There is no distension.     Palpations: Abdomen is soft.     Tenderness: There is no abdominal tenderness. There is no guarding or rebound.  Musculoskeletal:        General: Normal range of motion.     Cervical back: Normal  range of motion.     Comments: Ulnar gutter splint in place left arm patient reports from recent fall.  Skin:    General: Skin is warm and dry.  Neurological:     Mental Status: She is alert.     GCS: GCS eye subscore is 4. GCS verbal subscore is 5. GCS motor subscore is 6.     Comments: Speech is clear and goal oriented, follows commands Major Cranial nerves without deficit, no facial droop Moves extremities without ataxia, coordination intact  Psychiatric:        Behavior: Behavior normal.    ED Results / Procedures / Treatments   Labs (all labs ordered are listed, but only abnormal results are displayed) Labs Reviewed  LIPASE, BLOOD  COMPREHENSIVE METABOLIC PANEL  CBC  URINALYSIS, ROUTINE W REFLEX MICROSCOPIC    EKG None  Radiology No results found.  Procedures Procedures   Medications Ordered in ED Medications - No data to display  ED Course  I have reviewed the triage vital signs and the nursing notes.  Pertinent labs & imaging results that were available during my care of the patient were reviewed by me and considered in my medical decision making (see chart for details).    MDM Rules/Calculators/A&P                         Additional history obtained from: Nursing notes from this visit. Review of electronic medical records. - I ordered, reviewed and interpreted labs which include: CBC without leukocytosis or thrombocytopenia.  Hemoglobin of 11.3 appears baseline. CMP shows no emergent electrolyte derangement.  Patient has new elevation of creatinine 1.52 and BUN 43.  She is hyperglycemic at 388, bicarb elevated at 33, no gap.  Patient does not appear to be in DKA.  AST  slightly elevated.  GFR low at 40 Urinalysis and urine pregnancy test pending.  CTAP:  IMPRESSION:  1. No acute findings in the abdomen or pelvis. Specifically, no  findings to explain the patient's history of nausea and vomiting.  2. Small to moderate stool volume. Clinical constipation  is a  possibility.  3. Cholelithiasis.  4. 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  5. Aortic Atherosclerosis (ICD10-I70.0).   RUQ [place]:  IMPRESSION:  Moderate cholelithiasis without additional sonographic evidence of  acute cholecystitis.  ----------- Patient reevaluated she is resting up in bed no acute distress.  Plan of care is admission to medicine team for treatment of AKI.  Urinalysis is currently pending.  Suspect dehydration is secondary to decreased p.o. intake, at this time there does not appear to be evidence for DKA.  Care handoff given to Dr. Julieanne Manson at time of shift change.  Plan of care is to follow-up on urinalysis and consult medicine team for admission.  Case was discussed with attending physician Dr. Dina Rich who agrees with plan of care above  Note: Portions of this report may have been transcribed using voice recognition software. Every effort was made to ensure accuracy; however, inadvertent computerized transcription errors may still be present.  Final Clinical Impression(s) / ED Diagnoses Final diagnoses:  None    Rx / DC Orders ED Discharge Orders     None        Gari Crown 03/22/21 1511    Luna Fuse, MD 03/22/21 2139

## 2021-03-22 NOTE — Hospital Course (Signed)
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 Tawanna Sat with the heart failure team at [facility] -10/2020 echo: LVEF 15-20% -on coreg and losartan, spiro. Required lower dose after developing dizziness. Developed dizziness on lasix. Likely has autonomic dysfunction from DM  Non-obstructive CAD LM: Normal LAD: Mid 40% Lcx: Normal RCA: Mid 50%, IFR negative at 0.93 -continue aspirin and statin   Untreated OSA Asthma  Hx of stroke 2006 and 2007 with residual left sided weakness  Quit smoking in 1996

## 2021-03-22 NOTE — ED Notes (Signed)
Patient transported to CT 

## 2021-03-22 NOTE — Progress Notes (Signed)
Inpatient Diabetes Program Recommendations  AACE/ADA: New Consensus Statement on Inpatient Glycemic Control (2015)  Target Ranges:  Prepandial:   less than 140 mg/dL      Peak postprandial:   less than 180 mg/dL (1-2 hours)      Critically ill patients:  140 - 180 mg/dL   Lab Results  Component Value Date   GLUCAP 325 (H) 03/22/2021   HGBA1C 13.9 (H) 10/20/2017    Review of Glycemic Control Results for Cassie Mata, Cassie Mata (MRN [id number]) as of 03/22/2021 15:02  Ref. Range 03/22/2021 14:54  Glucose-Capillary Latest Ref Range: 70 - 99 mg/dL 295 (H)   Diabetes history: Type 2 DM Outpatient Diabetes medications: Farxiga 5 mg BID, Glipizide 5 mg BID, Metformin 500 mg BID Current orders for Inpatient glycemic control: none  Inpatient Diabetes Program Recommendations:   If to be admitted, consider adding Novolog 0-9 units Q4H and A1C.   Thanks, Lujean Rave, MSN, RNC-OB Diabetes Coordinator [phone] (8a-5p)

## 2021-03-23 DIAGNOSIS — N179 Acute kidney failure, unspecified: Secondary | ICD-10-CM | POA: Diagnosis not present

## 2021-03-23 DIAGNOSIS — R1110 Vomiting, unspecified: Secondary | ICD-10-CM | POA: Diagnosis not present

## 2021-03-23 DIAGNOSIS — K5900 Constipation, unspecified: Principal | ICD-10-CM

## 2021-03-23 LAB — FERRITIN: Ferritin: 949 ng/mL — ABNORMAL HIGH (ref 11–307)

## 2021-03-23 LAB — GLUCOSE, CAPILLARY
Glucose-Capillary: 137 mg/dL — ABNORMAL HIGH (ref 70–99)
Glucose-Capillary: 322 mg/dL — ABNORMAL HIGH (ref 70–99)
Glucose-Capillary: 359 mg/dL — ABNORMAL HIGH (ref 70–99)
Glucose-Capillary: 79 mg/dL (ref 70–99)
Glucose-Capillary: 94 mg/dL (ref 70–99)

## 2021-03-23 LAB — CBC
HCT: 30 % — ABNORMAL LOW (ref 36.0–46.0)
Hemoglobin: 9.9 g/dL — ABNORMAL LOW (ref 12.0–15.0)
MCH: 29 pg (ref 26.0–34.0)
MCHC: 33 g/dL (ref 30.0–36.0)
MCV: 88 fL (ref 80.0–100.0)
Platelets: 268 10*3/uL (ref 150–400)
RBC: 3.41 MIL/uL — ABNORMAL LOW (ref 3.87–5.11)
RDW: 13 % (ref 11.5–15.5)
WBC: 6.1 10*3/uL (ref 4.0–10.5)
nRBC: 0 % (ref 0.0–0.2)

## 2021-03-23 LAB — BASIC METABOLIC PANEL
Anion gap: 8 (ref 5–15)
BUN: 42 mg/dL — ABNORMAL HIGH (ref 6–20)
CO2: 29 mmol/L (ref 22–32)
Calcium: 8.8 mg/dL — ABNORMAL LOW (ref 8.9–10.3)
Chloride: 98 mmol/L (ref 98–111)
Creatinine, Ser: 1.47 mg/dL — ABNORMAL HIGH (ref 0.44–1.00)
GFR, Estimated: 42 mL/min — ABNORMAL LOW (ref >60)
Glucose, Bld: 417 mg/dL — ABNORMAL HIGH (ref 70–99)
Potassium: 4.4 mmol/L (ref 3.5–5.1)
Sodium: 135 mmol/L (ref 135–145)

## 2021-03-23 LAB — IRON AND TIBC
Iron: 123 ug/dL (ref 28–170)
Saturation Ratios: 40 % — ABNORMAL HIGH (ref 10.4–31.8)
TIBC: 304 ug/dL (ref 250–450)
UIBC: 181 ug/dL

## 2021-03-23 LAB — HIV ANTIBODY (ROUTINE TESTING W REFLEX): HIV Screen 4th Generation wRfx: NONREACTIVE

## 2021-03-23 LAB — CREATININE, SERUM
Creatinine, Ser: 1.4 mg/dL — ABNORMAL HIGH (ref 0.44–1.00)
GFR, Estimated: 44 mL/min — ABNORMAL LOW (ref >60)

## 2021-03-23 MED ORDER — LACTATED RINGERS IV BOLUS
500.0000 mL | Freq: Once | INTRAVENOUS | Status: AC
  Administered 2021-03-23: 500 mL via INTRAVENOUS

## 2021-03-23 MED ORDER — SENNOSIDES-DOCUSATE SODIUM 8.6-50 MG PO TABS
1.0000 | ORAL_TABLET | Freq: Two times a day (BID) | ORAL | Status: DC
  Administered 2021-03-23 – 2021-03-24 (×3): 1 via ORAL
  Filled 2021-03-23 (×3): qty 1

## 2021-03-23 MED ORDER — POLYETHYLENE GLYCOL 3350 17 G PO PACK
17.0000 g | PACK | Freq: Every day | ORAL | Status: DC
  Administered 2021-03-23 – 2021-03-24 (×2): 17 g via ORAL
  Filled 2021-03-23 (×3): qty 1

## 2021-03-23 MED ORDER — SENNOSIDES 8.8 MG/5ML PO SYRP
5.0000 mL | ORAL_SOLUTION | Freq: Once | ORAL | Status: AC
  Administered 2021-03-23: 5 mL via ORAL
  Filled 2021-03-23: qty 5

## 2021-03-23 MED ORDER — METOPROLOL TARTRATE 12.5 MG HALF TABLET: 12.5000 mg | ORAL_TABLET | Freq: Two times a day (BID) | ORAL | Status: DC

## 2021-03-23 MED ORDER — METOPROLOL TARTRATE 12.5 MG HALF TABLET: 12.5000 mg | ORAL_TABLET | Freq: Every day | ORAL | Status: DC

## 2021-03-23 MED ORDER — ONDANSETRON HCL 4 MG/2ML IJ SOLN
4.0000 mg | Freq: Three times a day (TID) | INTRAMUSCULAR | Status: DC | PRN
  Administered 2021-03-23 (×2): 4 mg via INTRAVENOUS
  Filled 2021-03-23 (×2): qty 2

## 2021-03-23 MED ORDER — INSULIN ASPART 100 UNIT/ML IJ SOLN
0.0000 [IU] | Freq: Three times a day (TID) | INTRAMUSCULAR | Status: DC
  Administered 2021-03-23: 15 [IU] via SUBCUTANEOUS
  Administered 2021-03-24: 8 [IU] via SUBCUTANEOUS

## 2021-03-23 MED ORDER — METOPROLOL TARTRATE 25 MG PO TABS
25.0000 mg | ORAL_TABLET | Freq: Two times a day (BID) | ORAL | Status: DC
  Administered 2021-03-23 – 2021-03-24 (×3): 25 mg via ORAL
  Filled 2021-03-23 (×3): qty 1

## 2021-03-23 MED ORDER — INSULIN GLARGINE 100 UNIT/ML ~~LOC~~ SOLN
10.0000 [IU] | Freq: Every day | SUBCUTANEOUS | Status: DC
  Administered 2021-03-23: 10 [IU] via SUBCUTANEOUS
  Filled 2021-03-23 (×3): qty 0.1

## 2021-03-23 NOTE — Discharge Instructions (Addendum)
Cassie Mata, Cassie Mata were hospitalized due to an acute kidney injury and treated with IV fluids.  This likely happened in the setting of dehydration due to your nausea and vomiting.  Please continue your medications as prescribed.    I want you to follow-up with our internal medicine center clinic in 1 week.  Someone will call you to set up an appointment.  In the meantime please continuing taking Lantus 10 units daily at bedtime as well as 15 units NovoLog with meals.  Give our clinic a call if you need refills on any of your glucose meter supplies, our number is [phone].  Thank you for allowing [place] to take part in your care!

## 2021-03-23 NOTE — Progress Notes (Signed)
   Subjective: No overnight events.  Patient states that she feels well this morning.  She is still having some minimal nausea but overall tolerating oral intake well.  Objective:  Vital signs in last 24 hours: Vitals:   03/22/21 2349 03/23/21 0453 03/23/21 0815 03/23/21 1203  BP: (!) 154/95 129/87 (!) 148/89 96/64  Pulse: 98 86 86 86  Resp: 20 18 18 18   Temp: 99.5 F (37.5 C) 98.7 F (37.1 C) 98.6 F (37 C) 98.4 F (36.9 C)  TempSrc: Oral Oral Oral Oral  SpO2: 100% 100% 100% 100%  Weight:  56.6 kg    Height:       Physical Exam General: alert, appears stated age, in no acute distress HEENT: Normocephalic, atraumatic, EOM intact, conjunctiva normal CV: Regular rate and rhythm, no murmurs rubs or gallops Pulm: Clear to auscultation bilaterally, normal work of breathing Abdomen: Soft, nondistended, bowel sounds present, no tenderness to palpation MSK: No lower extremity edema Skin: Warm and dry Neuro: Alert and oriented x3   Assessment/Plan:  Principal Problem:   AKI (acute kidney injury) (HCC) Active Problems:   Diabetes mellitus type II, uncontrolled (HCC)   Vomiting in adult   Constipation  55 year old female with HFrEF, uncontrolled diabetes and hypertension admitted for an AKI in the setting of chronic kidney disease stage IIIa.  AKI on CKD  Likely in the setting of dehydration from vomiting and poor oral intake.  She states that she has been feeling ill since starting pain medications in June after breaking her left hand.  Creatinine on admission was 1.52, 1.46 today.  Baseline creatinine of 1.19, GFR 40.  She has received about 2 L normal saline since admission.  Plan to recheck her creatinine this afternoon.  If improved she can be discharged home.  If it remains the same or elevated will repeat this gentle bolus of 500 cc LR. -Follow-up afternoon creatinine -May need to repeat small bolus if creatinine is stable or worsening -Encouraged increasing oral intake of  fluid  Nausea and vomiting Constipation Continue MiraLAX and Senokot as needed.  Zofran for nausea.  Increase p.o. intake as tolerated.  Uncontrolled type 2 diabetes Hemoglobin A1c is pending.  Home medications include glipizide, July and Trixeo however she has had difficulty with access to these medications due to insurance.  She stopped metformin in the past due to GI side effects.  Follows with endocrinology at [facility].  Blood glucose overnight was in the 400s.  Fasting glucose late this morning 79. -Continue Lantus 10 units daily -Continue glipizide 10 mg twice daily -SSI -Consider SGL 2 inhibitor   Paget's disease right iliac bone Seen on CT abdomen pelvis on admission.  Differential diagnosis includes renal osteodystrophy versus Paget's disease.  Patient takes cholecalciferol 2000 units daily. -Recommend outpatient work-up with phosphorus and PTH  Hypertension Continue home medications metoprolol 25 mg twice daily and losartan 25 mg daily  HFrEF Continue home medications, metoprolol, losartan and Lasix as needed.  Not currently taking Imdur.  States that she developed dizziness with Imdur and Lasix. -Strict ins and outs -Consider SGL 2 inhibitor  Diet: renal VTE: Lovenox Code: Full IVF: LR  Prior to Admission Living Arrangement: Home Anticipated Discharge Location: Home Barriers to Discharge: Clinical improvement Dispo: Anticipated discharge in approximately 0-1 day(s).   Lyrique Hakim N, DO 03/23/2021, 1:39 PM Pager: [phone] After 5pm on weekdays and 1pm on weekends: On Call pager [phone]

## 2021-03-24 ENCOUNTER — Ambulatory Visit (HOSPITAL_COMMUNITY)
Admission: EM | Admit: 2021-03-24 | Discharge: 2021-03-24 | Disposition: A | Discharge status: Home / Self Care | Payer: 59 | Attending: Internal Medicine | Admitting: Internal Medicine

## 2021-03-24 ENCOUNTER — Other Ambulatory Visit: Payer: Self-pay

## 2021-03-24 DIAGNOSIS — E162 Hypoglycemia, unspecified: Secondary | ICD-10-CM

## 2021-03-24 DIAGNOSIS — R42 Dizziness and giddiness: Secondary | ICD-10-CM | POA: Diagnosis not present

## 2021-03-24 LAB — BASIC METABOLIC PANEL
Anion gap: 9 (ref 5–15)
BUN: 31 mg/dL — ABNORMAL HIGH (ref 6–20)
CO2: 27 mmol/L (ref 22–32)
Calcium: 8.7 mg/dL — ABNORMAL LOW (ref 8.9–10.3)
Chloride: 100 mmol/L (ref 98–111)
Creatinine, Ser: 1.16 mg/dL — ABNORMAL HIGH (ref 0.44–1.00)
GFR, Estimated: 55 mL/min — ABNORMAL LOW (ref >60)
Glucose, Bld: 219 mg/dL — ABNORMAL HIGH (ref 70–99)
Potassium: 3.9 mmol/L (ref 3.5–5.1)
Sodium: 136 mmol/L (ref 135–145)

## 2021-03-24 LAB — GLUCOSE, CAPILLARY
Glucose-Capillary: 268 mg/dL — ABNORMAL HIGH (ref 70–99)
Glucose-Capillary: 426 mg/dL — ABNORMAL HIGH (ref 70–99)

## 2021-03-24 LAB — CBG MONITORING, ED
Glucose-Capillary: 142 mg/dL — ABNORMAL HIGH (ref 70–99)
Glucose-Capillary: 153 mg/dL — ABNORMAL HIGH (ref 70–99)

## 2021-03-24 MED ORDER — LIDOCAINE 5 % EX PTCH: 1.0000 | MEDICATED_PATCH | CUTANEOUS | 0 refills | Status: DC

## 2021-03-24 MED ORDER — GLIPIZIDE 5 MG PO TABS: ORAL_TABLET | ORAL | 0 refills | Status: DC

## 2021-03-24 MED ORDER — AGAMATRIX PRESTO TEST VI STRP
ORAL_STRIP | 12 refills | Status: AC
Start: 2021-03-24 — End: ?

## 2021-03-24 MED ORDER — INSULIN ASPART 100 UNIT/ML FLEXPEN: 10.0000 [IU] | PEN_INJECTOR | Freq: Three times a day (TID) | SUBCUTANEOUS | 11 refills | Status: DC

## 2021-03-24 MED ORDER — AGAMATRIX ULTRA-THIN LANCETS MISC: 0 refills | Status: DC

## 2021-03-24 MED ORDER — ONDANSETRON HCL 4 MG PO TABS: 4.0000 mg | ORAL_TABLET | Freq: Every day | ORAL | 0 refills | Status: AC | PRN

## 2021-03-24 MED ORDER — AGAMATRIX ULTRA-THIN LANCETS MISC: 11 refills | Status: DC

## 2021-03-24 MED ORDER — INSULIN GLARGINE 100 UNIT/ML ~~LOC~~ SOLN: 10.0000 [IU] | Freq: Every day | SUBCUTANEOUS | 11 refills | Status: DC

## 2021-03-24 MED ORDER — DAPAGLIFLOZIN PROPANEDIOL 10 MG PO TABS: 10.0000 mg | ORAL_TABLET | Freq: Every day | ORAL | 0 refills | Status: DC

## 2021-03-24 MED ORDER — NITROGLYCERIN 0.4 MG SL SUBL: 0.4000 mg | SUBLINGUAL_TABLET | SUBLINGUAL | 0 refills | Status: DC | PRN

## 2021-03-24 MED ORDER — LOSARTAN POTASSIUM 25 MG PO TABS: 50.0000 mg | ORAL_TABLET | Freq: Every day | ORAL | 0 refills | Status: DC

## 2021-03-24 MED ORDER — METOPROLOL TARTRATE 25 MG PO TABS: ORAL_TABLET | ORAL | 0 refills | Status: DC

## 2021-03-24 MED ORDER — INSULIN GLARGINE 100 UNIT/ML ~~LOC~~ SOLN
10.0000 [IU] | Freq: Once | SUBCUTANEOUS | Status: AC
  Administered 2021-03-24: 10 [IU] via SUBCUTANEOUS
  Filled 2021-03-24 (×2): qty 0.1

## 2021-03-24 MED ORDER — INSULIN ASPART 100 UNIT/ML IJ SOLN
20.0000 [IU] | Freq: Once | INTRAMUSCULAR | Status: AC
  Administered 2021-03-24: 20 [IU] via SUBCUTANEOUS

## 2021-03-24 MED ORDER — INSULIN SYRINGES (DISPOSABLE) U-100 0.3 ML MISC: 1.0000 | 10 refills | Status: DC | PRN

## 2021-03-24 NOTE — Discharge Instructions (Addendum)
Your blood sugar stabilized.  Continue eating small frequent meals as we discussed.  Monitor your blood sugar regularly and if this drops below 70 you need to drink 4 ounces of juice or eat 15 g of carbohydrates.  I would recommend getting glucose tablets from the pharmacy to have on hand.  If her blood sugar drops repeatedly you should go to the emergency room as you may need glucagon versus dextrose infusion.  Please follow-up with your primary care provider first thing tomorrow.

## 2021-03-24 NOTE — ED Provider Notes (Signed)
[address]    CSN: [id number] Arrival date & time: 03/24/21  [place]      History   Chief Complaint Chief Complaint  Patient presents with   Hypoglycemia    HPI Cassie Mata is a 55 y.o. female.   Patient presents today with a several day history of guardedness and lightheadedness.  She was recently hospitalized at [facility] and discharged earlier today.  She felt lightheaded and groggy and had associated generalized weakness when she returned home.  She believes that this is because she ate too much insulin and was discharged prior to her blood sugar stabilizing.  She ate 2 pieces of candy when she was at home and felt significantly better.  She did not check her blood sugar during this episode.  She contacted her primary care provider Dr. Elodia Florence who recommended she be evaluated at urgent care or emergency room.  She reports she is feeling better but continues to feel weak and lightheaded.  She denies any syncopal episode.  Denies any chest pain or shortness of breath.  She has not eaten anything other than 2 pieces of candy prior to arrival and has not had anything while in clinic.  She has not taken any additional medications since being home and really feels less given insulin a few hours prior to discharge.   Past Medical History:  Diagnosis Date   Arthritis    Asthma    Cataract    NS OU   CHF (congestive heart failure) (HCC)    Coma ([place])    Depression    Diabetes mellitus    Diabetic retinopathy ([place])    OU   Fibromyalgia    Gall stone    Gastroesophageal reflux disease    Hiatal hernia    Insomnia 08/07/2014   Myalgia and myositis, unspecified 01/13/2013   Stroke ([place]) 2007   Pt. evaluated at [facility] had left sided weakness.  Reportedly no cerebral imaging ever done.  Records we have received from [facility] dated 2011 do not document this.  Rest of records in storage and have not yet sent.    Patient Active Problem List    Diagnosis Date Noted   Constipation    Vomiting in adult 03/22/2021   AKI (acute kidney injury) ([place]) 03/22/2021   Metacarpal bone fracture 03/13/2021   Diabetes mellitus type II, uncontrolled ([place]) 08/08/2015   Essential hypertension 08/08/2015   GERD (gastroesophageal reflux disease) 08/08/2015   Chronic low back pain with sciatica 08/08/2015   Arthritis 08/08/2015   Depression 06/15/2015   Insomnia 08/07/2014   Myalgia and myositis 01/13/2013    Past Surgical History:  Procedure Laterality Date   CESAREAN SECTION  1987,1988,1996,2006    OB History   No obstetric history on file.      Home Medications    Prior to Admission medications   Medication Sig Start Date End Date Taking? Authorizing Provider  AgaMatrix Ultra-Thin Lancets MISC Check sugars twice daily before meals 03/24/21   Raynisha Avilla K, PA-C  albuterol (PROVENTIL HFA;VENTOLIN HFA) 108 (90 Base) MCG/ACT inhaler Inhale 1-2 puffs into the lungs every 6 (six) hours as needed for wheezing. 10/20/17   Mack Hook, MD  aspirin 81 MG tablet Take 81 mg by mouth daily.     [provider]  atorvastatin (LIPITOR) 80 MG tablet Take 80 mg by mouth daily. 11/08/20   [provider]  benzonatate (TESSALON) 100 MG capsule Take 1-2 capsules (100-200 mg total) by mouth  3 (three) times daily as needed. 07/19/18   Jaynee Eagles, PA-C  Blood Glucose Monitoring Suppl (AGAMATRIX PRESTO) w/Device KIT Check blood sugars twice daily before meals 10/20/17   Mack Hook, MD  cholecalciferol (VITAMIN D) 1000 UNITS tablet Take 2,000 Units by mouth at bedtime.     [provider]  cyclobenzaprine (FLEXERIL) 10 MG tablet Take 1 tablet (10 mg total) by mouth 2 (two) times daily as needed for muscle spasms. 10/31/17   Shelda Pal, DO  dapagliflozin propanediol (FARXIGA) 10 MG TABS tablet Take 1 tablet (10 mg total) by mouth daily. 03/24/21   Rehman, Areeg N, DO  diphenhydrAMINE (BENADRYL) 25 mg capsule  Take 25 mg by mouth every 6 (six) hours as needed for allergies.    [provider]  Fluticasone-Salmeterol (ADVAIR) 250-50 MCG/DOSE AEPB 1 inhalation twice daily followed by brushing teeth and tongue 10/20/17   Mack Hook, MD  furosemide (LASIX) 40 MG tablet Take 1 tablet (40 mg total) by mouth daily for 5 days. Patient not taking: Reported on 03/22/2021 07/30/20 08/04/20  Loura Halt A, NP  glipiZIDE (GLUCOTROL) 5 MG tablet TAKE 1 TABLET BY MOUTH TWICE DAILY WITH MEALS 03/24/21   Rehman, Areeg N, DO  glucose blood (AGAMATRIX PRESTO TEST) test strip Check sugars twice daily before meals 03/24/21   Rehman, Areeg N, DO  ibuprofen (ADVIL) 200 MG tablet Take 200 mg by mouth daily as needed for headache or mild pain.    [provider]  insulin aspart (NOVOLOG) 100 UNIT/ML FlexPen Inject 10 Units into the skin 3 (three) times daily with meals. 03/24/21   Rehman, Areeg N, DO  insulin glargine (LANTUS) 100 UNIT/ML injection Inject 0.1 mLs (10 Units total) into the skin at bedtime. 03/25/21   Rehman, Areeg N, DO  Insulin Syringes, Disposable, U-100 0.3 ML MISC 1 Syringe by Does not apply route as needed. 03/24/21   Sid Falcon, MD  isosorbide mononitrate (IMDUR) 30 MG 24 hr tablet Take 30 mg by mouth daily. Patient not taking: No sig reported 11/08/20   [provider]  lidocaine (LIDODERM) 5 % Place 1 patch onto the skin daily. Remove & Discard patch within 12 hours or as directed by MD 03/24/21   Rehman, Areeg N, DO  losartan (COZAAR) 25 MG tablet Take 2 tablets (50 mg total) by mouth daily. 03/24/21   Rehman, Areeg N, DO  metoprolol tartrate (LOPRESSOR) 25 MG tablet Take 1/2 (one-half) tablet by mouth twice daily 03/24/21   Rehman, Areeg N, DO  Multiple Vitamin (MULTIVITAMIN) tablet Take 1 tablet by mouth daily.    [provider]  nitroGLYCERIN (NITROSTAT) 0.4 MG SL tablet Place 1 tablet (0.4 mg total) under the tongue every 5 (five) minutes as needed for chest pain.  03/24/21 04/23/21  Rehman, Areeg N, DO  ondansetron (ZOFRAN) 4 MG tablet Take 1 tablet (4 mg total) by mouth daily as needed for nausea or vomiting. 03/24/21 03/24/22  Rehman, Areeg N, DO  pantoprazole (PROTONIX) 40 MG tablet Take 1 tablet (40 mg total) by mouth daily. Reported on 09/13/2015 07/19/18   Jaynee Eagles, PA-C    Family History Family History  Problem Relation Age of Onset   Diabetes Sister    Alcohol abuse Sister    Cirrhosis Sister    Mental illness Son        suicidal thoughts   ADD / ADHD Son    Diabetes Father    Cancer Father  Brain   Cirrhosis Mother        alcoholic   Mental illness Brother    Arthritis Sister    Cancer Sister        ?possible gastric cancer   ADD / ADHD Son    ADD / ADHD Son    Mental illness Son        schizophrenia   Blindness Son        Legal blindness    Social History Social History   Tobacco Use   Smoking status: Former    Years: 17.00    Types: Cigarettes    Quit date: 08/03/1995    Years since quitting: 25.6   Smokeless tobacco: Never  Substance Use Topics   Alcohol use: No   Drug use: No     Allergies   Eszopiclone, Gabapentin, Ivp dye [iodinated diagnostic agents], Other, Prednisone, and Ultracet [tramadol-acetaminophen]   Review of Systems Review of Systems  Constitutional:  Negative for activity change, appetite change, fatigue and fever.  Respiratory:  Negative for cough and shortness of breath.   Cardiovascular:  Negative for chest pain.  Gastrointestinal:  Negative for abdominal pain, diarrhea, nausea and vomiting.  Neurological:  Positive for weakness (generalized) and light-headedness. Negative for dizziness and headaches.    Physical Exam Triage Vital Signs ED Triage Vitals  Enc Vitals Group     BP 03/24/21 1826 (!) 143/77     Pulse Rate 03/24/21 1826 89     Resp 03/24/21 1826 18     Temp 03/24/21 1826 98.7 F (37.1 C)     Temp src --      SpO2 03/24/21 1826 100 %     Weight --      Height --       Head Circumference --      Peak Flow --      Pain Score 03/24/21 1827 0     Pain Loc --      Pain Edu? --      Excl. in [place]? --    No data found.  Updated Vital Signs BP (!) 143/77   Pulse 89   Temp 98.7 F (37.1 C)   Resp 18   LMP 09/06/2015 (Exact Date)   SpO2 100%   Visual Acuity Right Eye Distance:   Left Eye Distance:   Bilateral Distance:    Right Eye Near:   Left Eye Near:    Bilateral Near:     Physical Exam Vitals reviewed.  Constitutional:      General: She is awake. She is not in acute distress.    Appearance: Normal appearance. She is normal weight. She is not ill-appearing.     Comments: Very pleasant female appears stated age no acute distress lying comfortably on exam room table  HENT:     Head: Normocephalic and atraumatic.  Eyes:     Extraocular Movements: Extraocular movements intact.     Conjunctiva/sclera: Conjunctivae normal.     Pupils: Pupils are equal, round, and reactive to light.  Cardiovascular:     Rate and Rhythm: Normal rate and regular rhythm.     Heart sounds: Normal heart sounds, S1 normal and S2 normal. No murmur heard. Pulmonary:     Effort: Pulmonary effort is normal.     Breath sounds: Normal breath sounds. No wheezing, rhonchi or rales.     Comments: Clear auscultation bilaterally Abdominal:     General: Bowel sounds are normal.     Palpations: Abdomen  is soft.     Tenderness: There is no abdominal tenderness. There is no right CVA tenderness, left CVA tenderness, guarding or rebound.  Musculoskeletal:     Comments: Strength 5/5 bilateral upper and lower extremities  Neurological:     General: No focal deficit present.     Mental Status: She is alert and oriented to person, place, and time.     Cranial Nerves: Cranial nerves are intact.     Motor: Motor function is intact.     Coordination: Coordination is intact.     Comments: Cranial nerves grossly intact.  No focal neurological defect on exam.  Psychiatric:         Behavior: Behavior is cooperative.       UC Treatments / Results  Labs (all labs ordered are listed, but only abnormal results are displayed) Labs Reviewed  CBG MONITORING, ED - Abnormal; Notable for the following components:      Result Value   Glucose-Capillary 142 (*)    All other components within normal limits  CBG MONITORING, ED - Abnormal; Notable for the following components:   Glucose-Capillary 153 (*)    All other components within normal limits    EKG   Radiology No results found.  Procedures Procedures (including critical care time)  Medications Ordered in UC Medications - No data to display  Initial Impression / Assessment and Plan / UC Course  I have reviewed the triage vital signs and the nursing notes.  Pertinent labs & imaging results that were available during my care of the patient were reviewed by me and considered in my medical decision making (see chart for details).     Blood sugar was within normal limits today and increased from 142 during triage to 153 approximately 20 minutes later.  Discussed that this is encouraging and hopefully means she would not have a second hyperglycemic event.  Patient was encouraged to eat small frequent meals throughout the day and monitor blood sugar closely.  She did not have lancets available so lancet prescription was sent to 24-hour pharmacy.  Discussed that if she does have recurrent hypoglycemic episode she would need to go to the emergency room for further evaluation and management.  Given lightheaded symptoms offered more extensive work-up including EKG and lab work but patient declined this today as she is confident that her symptoms are related to blood sugar.  Strict return precautions given to which patient expressed understanding.   Final Clinical Impressions(s) / UC Diagnoses   Final diagnoses:  Lightheadedness  Hypoglycemia     Discharge Instructions      Your blood sugar stabilized.  Continue  eating small frequent meals as we discussed.  Monitor your blood sugar regularly and if this drops below 70 you need to drink 4 ounces of juice or eat 15 g of carbohydrates.  I would recommend getting glucose tablets from the pharmacy to have on hand.  If her blood sugar drops repeatedly you should go to the emergency room as you may need glucagon versus dextrose infusion.  Please follow-up with your primary care provider first thing tomorrow.     ED Prescriptions     Medication Sig Dispense Auth. Provider   AgaMatrix Ultra-Thin Lancets MISC Check sugars twice daily before meals 100 each Rian Busche K, PA-C      PDMP not reviewed this encounter.   Terrilee Croak, PA-C 03/24/21 1927

## 2021-03-24 NOTE — Discharge Summary (Addendum)
 Name: Cassie Mata MRN: [id number] DOB: 10/19/1965 55 y.o. PCP: Pcp, No  Date of Admission: 03/22/2021 10:46 AM Date of Discharge: 03/24/2021 Attending Physician: Mullen, Emily B, MD  Discharge Diagnosis: 1. Acute kidney injury in setting of CKD 2. Nausea/vomiting 2/2 clinical constipation 3. 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 Uncontrolled type 2 diabetes mellitus  Discharge Medications: Allergies as of 03/24/2021       Reactions   Eszopiclone    Other reaction(s): Other (See Comments) Generic for lunesta-caused stroke like symptoms   Gabapentin Other (See Comments)   Other reaction(s): GI Upset (intolerance)   Ivp Dye [iodinated Diagnostic Agents] Nausea Only   Other    "allergy medications"= makes heart flutter   Prednisone Other (See Comments)   Elevates blood sugar   Ultracet [tramadol-acetaminophen] Nausea And Vomiting        Medication List     STOP taking these medications    HYDROcodone-acetaminophen 5-325 MG tablet Commonly known as: NORCO/VICODIN   metFORMIN 500 MG tablet Commonly known as: GLUCOPHAGE       TAKE these medications    AgaMatrix Presto Test test strip Generic drug: glucose blood Check sugars twice daily before meals   AgaMatrix Presto w/Device Kit Check blood sugars twice daily before meals   AgaMatrix Ultra-Thin Lancets Misc Check sugars twice daily before meals   albuterol 108 (90 Base) MCG/ACT inhaler Commonly known as: VENTOLIN HFA Inhale 1-2 puffs into the lungs every 6 (six) hours as needed for wheezing.   aspirin 81 MG tablet Take 81 mg by mouth daily.   atorvastatin 80 MG tablet Commonly known as: LIPITOR Take 80 mg by mouth daily.   benzonatate 100 MG capsule Commonly known as: TESSALON Take 1-2 capsules (100-200 mg total) by mouth 3 (three) times daily as needed.   cholecalciferol 1000 units tablet Commonly known as: VITAMIN D Take 2,000 Units by mouth at bedtime.   cyclobenzaprine 10 MG  tablet Commonly known as: FLEXERIL Take 1 tablet (10 mg total) by mouth 2 (two) times daily as needed for muscle spasms.   dapagliflozin propanediol 10 MG Tabs tablet Commonly known as: FARXIGA Take 1 tablet (10 mg total) by mouth daily.   diphenhydrAMINE 25 mg capsule Commonly known as: BENADRYL Take 25 mg by mouth every 6 (six) hours as needed for allergies.   Fluticasone-Salmeterol 250-50 MCG/DOSE Aepb Commonly known as: ADVAIR 1 inhalation twice daily followed by brushing teeth and tongue What changed:  how much to take how to take this when to take this   furosemide 40 MG tablet Commonly known as: LASIX Take 1 tablet (40 mg total) by mouth daily for 5 days.   glipiZIDE 5 MG tablet Commonly known as: GLUCOTROL TAKE 1 TABLET BY MOUTH TWICE DAILY WITH MEALS What changed:  how much to take how to take this when to take this   ibuprofen 200 MG tablet Commonly known as: ADVIL Take 200 mg by mouth daily as needed for headache or mild pain.   insulin aspart 100 UNIT/ML FlexPen Commonly known as: NOVOLOG Inject 10 Units into the skin 3 (three) times daily with meals.   insulin glargine 100 UNIT/ML injection Commonly known as: LANTUS Inject 0.1 mLs (10 Units total) into the skin at bedtime. Start taking on: March 25, 2021   lidocaine 5 % Commonly known as: Lidoderm Place 1 patch onto the skin daily. Remove & Discard patch within 12 hours or as directed by MD   losartan 25 MG   tablet Commonly known as: COZAAR Take 2 tablets (50 mg total) by mouth daily. What changed: medication strength   metoprolol tartrate 25 MG tablet Commonly known as: LOPRESSOR Take 1/2 (one-half) tablet by mouth twice daily What changed:  how much to take how to take this when to take this additional instructions   multivitamin tablet Take 1 tablet by mouth daily.   nitroGLYCERIN 0.4 MG SL tablet Commonly known as: NITROSTAT Place 1 tablet (0.4 mg total) under the tongue every 5  (five) minutes as needed for chest pain.   ondansetron 4 MG tablet Commonly known as: Zofran Take 1 tablet (4 mg total) by mouth daily as needed for nausea or vomiting.   pantoprazole 40 MG tablet Commonly known as: PROTONIX Take 1 tablet (40 mg total) by mouth daily. Reported on 09/13/2015       ASK your doctor about these medications    isosorbide mononitrate 30 MG 24 hr tablet Commonly known as: IMDUR Take 30 mg by mouth daily.        Disposition and follow-up:   [name] was discharged from [facility] in Stable condition.  At the hospital follow up visit please address:  1.  AKI on CKD: Patient with prerenal AKI in the setting of GI fluid losses. Patient's AKI resolved with fluids. Patient will be scheduled for appointment with [facility] for chronic management of CKD. Nausea/Vomiting/Constipation: Patient's nausea likely 2/2 opioid induced constipation and nausea can be side effect of opioid medication. Patient counseled to use tylenol for L Hand pain and she is agreeable with this plan.  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 Uncontrolled type 2 diabetes: Patient had elevated blood glucose during admission on Lantus, glipizide and SSI. Plan to consider adding SGL-2i in an outpatient setting. Patient was prescribed lantus and novolog at discharge for blood glucose control.  2.  Labs / imaging needed at time of follow-up: PTH, Phosphorus  3.  Pending labs/ test needing follow-up: Hgb A1c  Follow-up Appointments:  Follow-up Information     [facility]. Call in 1 week(s).   Why: Our office will call you to make a hospital follow up appointment in one week. Contact information: [address] [place] Hospital Course by problem  list:  AKI on CKDIIIa 55 year old female with HFrEF, uncontrolled diabetes and hypertension admitted for an AKI in the setting of chronic kidney disease stage IIIa. Prerenal AKI likely in the setting of dehydration from vomiting and poor oral intake.  She states that she has been feeling ill since starting pain medications in June after breaking her left hand.  Creatinine on admission was 1.52, baseline creatinine of 1.19, creatinine 1.16 on day of discharge. She received about 2.5 L fluids since admission with resolution of AKI. Patient was discharged on 03/24/2021 and wishes to establish care with [facility] IM teaching service. She will follow up for hospital follow up visit in on week.  Nausea and vomiting Constipation Patient presented with 3d hx of nausea, vomiting, subjective constipation. She believes constipation is causing nausea/vomiting. She was taking opioid pain medication for L hand post op pain which may have contributed to constipation. CT abdomen pelvis without acute findings and low/mod stool burden. Patient was started on  MiraLAX and Senokot as needed.  Patient received Zofran for nausea as needed. Patient's nausea improved after starting regular diet and she was tolerating PO on day of discharge. Patient understands that opioid pain medications can contribute to constipation, nausea, vomiting, and she will use tylenol for pain after discharge.    Uncontrolled type 2 diabetes Hemoglobin A1c ordered this admission still pending.  Home medications include glipizide, Farxiga and Trixeo however she has had difficulty with access to these medications due to insurance.  She stopped metformin in the past due to GI side effects.  Follows with endocrinology at [facility]. Patient was started on Lantus 10 units daily, glipizide 10 mg twice daily, and SSI for hyperglycemia. Patient will follow up with [facility] in 1-2 weeks. Plan to consider starting SGL 2 inhibitor as outpatient. Patient was  prescribed lantus and novolog at discharge for blood glucosse control.  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  Differential diagnosis includes renal osteodystrophy versus Paget's disease.  Patient takes cholecalciferol 2000 units daily. Patient should pursue outpatient work-up with phosphorus and PTH.  Hypertension Patient was continued on  home medications metoprolol 25 mg twice daily and losartan 25 mg daily for blood pressure control.  HFrEF Patient was continued on home medications, metoprolol, losartan and Lasix as needed.  Not currently taking Imdur.  States that she developed dizziness with Imdur and Lasix. Patient remained euvolemic on exam this admission. No additional medical management was required.   Normocytic anemia Hgb 9.9. Ferritin 949. Patient likely has anemia of chronic disease. She was asymptomatic, likely chronic anemia. No medical management this admission.  Subjective on Day of Discharge: Overnight: not acute events or concerns overnight  Patient reports feeling better. Her nausea improved after eating dinner last night. She is tolerating PO well and ambulating well. We discussed opioid medication and how it is likely contributing to her feeling constipated and nauseous. She voiced understanding and we agreed on using tylenol for pain in L hand. She denies pain in left hand today. She states she feels she is ready to go home today.    Discharge Exam:   BP 121/76 (BP Location: Left Arm)   Pulse 82   Temp 97.8 F (36.6 C) (Oral)   Resp 18   Ht 5' (1.524 m)   Wt 57.7 kg   LMP 09/06/2015 (Exact Date)   SpO2 100%   BMI 24.82 kg/m  Physical Exam: General: Well appearing, well nourished african american female, NAD HENT: normocephalic, atraumatic EYES: conjunctiva non-erythematous, no scleral icterus CV: regular rate, normal rhythm, no murmurs, rubs, gallops. Pulmonary: sating well on RA, lung clear to auscultation, no  rales, wheezes, rhonchi Abdominal: non-distended, soft, non-tender to palpation, normal BS Skin: Warm and dry, no rashes or lesions Neurological: MS: awake, alert and oriented x3, normal speech and fund of knowledge Motor: moves all extremities antigravity Psych: normal affect   Pertinent Labs, Studies, and Procedures:  CBC Latest Ref Rng & Units 03/23/2021 03/22/2021 03/22/2021  WBC 4.0 - 10.5 K/uL 6.1 7.1 5.8  Hemoglobin 12.0 - 15.0 g/dL 9.9(L) 10.1(L) 11.3(L)  Hematocrit 36.0 - 46.0 % 30.0(L) 30.8(L) 34.5(L)  Platelets 150 - 400 K/uL 268 271 303   BMP Latest Ref Rng & Units 03/24/2021 03/23/2021 03/23/2021  Glucose 70 - 99 mg/dL 219(H) - 417(H)  BUN 6 - 20 mg/dL 31(H) - 42(H)  Creatinine 0.44 - 1.00 mg/dL 1.16(H) 1.40(H) 1.47(H)  BUN/Creat Ratio 9 - 23 - - -    Sodium 135 - 145 mmol/L 136 - 135  Potassium 3.5 - 5.1 mmol/L 3.9 - 4.4  Chloride 98 - 111 mmol/L 100 - 98  CO2 22 - 32 mmol/L 27 - 29  Calcium 8.9 - 10.3 mg/dL 8.7(L) - 8.8(L)   Lipase, blood [[id number]] Collected: 03/22/21 1115  Specimen: Blood from Vein Updated: 03/22/21 1244   Lipase 35 U/L    Urinalysis, Routine w reflex microscopic Urine, Clean Catch [[id number]] (Abnormal) Collected: 03/22/21 1604  Specimen: Urine, Clean Catch Updated: 03/22/21 1659   Color, Urine YELLOW   APPearance HAZY Abnormal    Specific Gravity, Urine 1.027   pH 6.0   Glucose, UA >=500 Abnormal  mg/dL    Hgb urine dipstick NEGATIVE   Bilirubin Urine NEGATIVE   Ketones, ur 5 Abnormal  mg/dL    Protein, ur 100 Abnormal  mg/dL    Nitrite NEGATIVE   Leukocytes,Ua NEGATIVE   RBC / HPF 0-5 RBC/hpf    WBC, UA 0-5 WBC/hpf    Bacteria, UA RARE Abnormal    Squamous Epithelial / LPF 11-20   Mucus PRESENT  Pregnancy, urine [[id number]] Collected: 03/22/21 1604  Specimen: Urine, Clean Catch Updated: 03/22/21 1656   Preg Test, Ur NEGATIVE   Iron and TIBC [[id number]] (Abnormal) Collected: 03/23/21 0338  Specimen: Blood Updated: 03/23/21 0530    Iron 123 ug/dL    TIBC 304 ug/dL    Saturation Ratios 40 High  %    UIBC 181 ug/dL   Ferritin [[id number]] (Abnormal) Collected: 03/23/21 0338  Specimen: Blood Updated: 03/23/21 0530   Ferritin 949 High  ng/mL     Discharge Instructions: Discharge Instructions     Call MD for:  persistant nausea and vomiting   Complete by: As directed    Call MD for:  severe uncontrolled pain   Complete by: As directed    Call MD for:  temperature >100.4   Complete by: As directed    Diet - low sodium heart healthy   Complete by: As directed    Increase activity slowly   Complete by: As directed        Wayland Denis, MD 03/24/21 , 1:01 PM  Pager: [phone] Internal Medicine Resident, PGY-1 [facility]

## 2021-03-24 NOTE — ED Triage Notes (Addendum)
Pt reports she had to much in insuline while in hospital. Pt reported she had to eat 2 pieces of candy to get up to get ready to come in.

## 2021-03-25 LAB — HEMOGLOBIN A1C
Hgb A1c MFr Bld: 15.2 % — ABNORMAL HIGH (ref 4.8–5.6)
Mean Plasma Glucose: 390 mg/dL

## 2021-03-26 ENCOUNTER — Telehealth: Payer: Self-pay | Admitting: *Deleted

## 2021-03-26 ENCOUNTER — Telehealth: Payer: Self-pay

## 2021-03-26 NOTE — Telephone Encounter (Signed)
Information was sent through CoverMyMeds for [place] for Lidocaine 5% Patches .  Awaiting determination.  Angelina Ok, RN 03/26/2021 8:59 AM.

## 2021-03-26 NOTE — Telephone Encounter (Signed)
TOC HFU appointment 04/02/2021 at 3:15 pm.  Unable to contact patient via telephone.  Appointment card mailed.

## 2021-03-26 NOTE — Telephone Encounter (Signed)
-----   Message from Jaci Standard, DO sent at 03/24/2021 10:29 AM EDT ----- Hi,  Can we schedule this patient for a hospital follow up? She wants to also establish with [place]. Thanks!

## 2021-03-27 NOTE — Progress Notes (Signed)
Discharge instructions reviewed and given to pt and husband, pt/husband acknowledge understanding.

## 2021-04-02 ENCOUNTER — Encounter: Payer: Self-pay | Admitting: Internal Medicine

## 2021-04-02 ENCOUNTER — Other Ambulatory Visit: Payer: Self-pay

## 2021-04-02 ENCOUNTER — Ambulatory Visit (INDEPENDENT_AMBULATORY_CARE_PROVIDER_SITE_OTHER): Payer: 59 | Admitting: Internal Medicine

## 2021-04-02 VITALS — BP 172/90 | HR 99 | Temp 98.4°F | Ht 60.0 in | Wt 136.2 lb

## 2021-04-02 DIAGNOSIS — E860 Dehydration: Secondary | ICD-10-CM | POA: Diagnosis not present

## 2021-04-02 DIAGNOSIS — I10 Essential (primary) hypertension: Secondary | ICD-10-CM | POA: Diagnosis not present

## 2021-04-02 DIAGNOSIS — S62357A Nondisplaced fracture of shaft of fifth metacarpal bone, left hand, initial encounter for closed fracture: Secondary | ICD-10-CM

## 2021-04-02 DIAGNOSIS — N179 Acute kidney failure, unspecified: Secondary | ICD-10-CM

## 2021-04-02 DIAGNOSIS — E1165 Type 2 diabetes mellitus with hyperglycemia: Secondary | ICD-10-CM

## 2021-04-02 LAB — POCT GLYCOSYLATED HEMOGLOBIN (HGB A1C): HbA1c POC (<> result, manual entry): >14 % — AB (ref 4.0–5.6)

## 2021-04-02 LAB — GLUCOSE, CAPILLARY: Glucose-Capillary: 387 mg/dL — ABNORMAL HIGH (ref 70–99)

## 2021-04-02 NOTE — Patient Instructions (Signed)
Schedule you with Donna-dietician/diabetic counselor  2. Pharmacist- medication reconciliation  3. Pick up medications from your pharmacy

## 2021-04-02 NOTE — Assessment & Plan Note (Signed)
Cassie Mata's current A1c today greater than 14.  Glucose levels above 300.  She admits to nonadherence to medication.  She reports poor diet, eating sweets and candies whenever she wants.  She currently does not check her glucose levels daily, she states.  She was prescribed novolog 10 units 3 times a day with meals and Farxiga 10 mg daily upon ED discharge 03/24/2021.  She states she has not picked up the prescription.  PLAN: Referred to [name] for diabetic education and nutrition counseling. Referred to clinical pharmacist for medication reconciliation. Instructed to pick up NovoLog and Farxiga from pharmacy. Counseled on diet modification. Follow-up in 3 months

## 2021-04-02 NOTE — Progress Notes (Signed)
CC: Hospital follow-up and hyperglycemia  HPI:  Cassie Mata is a 55 y.o. female with a past medical history stated below and presents today for hospital follow-up and hyperglycemia.  Cassie Mata presented to the ED on 03/22/2021 with abdominal pain, nausea, vomiting, and lightheadedness.  CT showed no abnormalities, ultrasound of the right upper quadrant showed no abnormalities.  BMP showed prerenal AKI.  She was discharged 48 hours later on 03/24/2021, symptoms resolved.  Today she denies any abdominal pain, nausea, vomiting, or lightheadedness.  She has increased her fluid intake and reports normal appetite.    Please see problem based assessment and plan for additional details.  Past Medical History:  Diagnosis Date   Arthritis    Asthma    Cataract    NS OU   CHF (congestive heart failure) (HCC)    Coma ([place])    Depression    Diabetes mellitus    Diabetic retinopathy ([place])    OU   Fibromyalgia    Gall stone    Gastroesophageal reflux disease    Hiatal hernia    Insomnia 08/07/2014   Myalgia and myositis, unspecified 01/13/2013   Stroke ([place]) 2007   Pt. evaluated at [facility] had left sided weakness.  Reportedly no cerebral imaging ever done.  Records we have received from [facility] dated 2011 do not document this.  Rest of records in storage and have not yet sent.    Current Outpatient Medications on File Prior to Visit  Medication Sig Dispense Refill   AgaMatrix Ultra-Thin Lancets MISC Check sugars twice daily before meals 100 each 0   albuterol (PROVENTIL HFA;VENTOLIN HFA) 108 (90 Base) MCG/ACT inhaler Inhale 1-2 puffs into the lungs every 6 (six) hours as needed for wheezing. 1 Inhaler 0   aspirin 81 MG tablet Take 81 mg by mouth daily.      atorvastatin (LIPITOR) 80 MG tablet Take 80 mg by mouth daily.     benzonatate (TESSALON) 100 MG capsule Take 1-2 capsules (100-200 mg total) by mouth 3 (three) times daily as needed. 60 capsule 0   Blood  Glucose Monitoring Suppl (AGAMATRIX PRESTO) w/Device KIT Check blood sugars twice daily before meals 1 kit 0   cholecalciferol (VITAMIN D) 1000 UNITS tablet Take 2,000 Units by mouth at bedtime.      cyclobenzaprine (FLEXERIL) 10 MG tablet Take 1 tablet (10 mg total) by mouth 2 (two) times daily as needed for muscle spasms. 20 tablet 0   dapagliflozin propanediol (FARXIGA) 10 MG TABS tablet Take 1 tablet (10 mg total) by mouth daily. 30 tablet 0   diphenhydrAMINE (BENADRYL) 25 mg capsule Take 25 mg by mouth every 6 (six) hours as needed for allergies.     Fluticasone-Salmeterol (ADVAIR) 250-50 MCG/DOSE AEPB 1 inhalation twice daily followed by brushing teeth and tongue 1 each 11   furosemide (LASIX) 40 MG tablet Take 1 tablet (40 mg total) by mouth daily for 5 days. (Patient not taking: Reported on 03/22/2021) 5 tablet 0   glipiZIDE (GLUCOTROL) 5 MG tablet TAKE 1 TABLET BY MOUTH TWICE DAILY WITH MEALS 60 tablet 0   glucose blood (AGAMATRIX PRESTO TEST) test strip Check sugars twice daily before meals 100 each 12   ibuprofen (ADVIL) 200 MG tablet Take 200 mg by mouth daily as needed for headache or mild pain.     insulin aspart (NOVOLOG) 100 UNIT/ML FlexPen Inject 10 Units into the skin 3 (three) times daily with meals. 15 mL 11  insulin glargine (LANTUS) 100 UNIT/ML injection Inject 0.1 mLs (10 Units total) into the skin at bedtime. 10 mL 11   Insulin Syringes, Disposable, U-100 0.3 ML MISC 1 Syringe by Does not apply route as needed. 100 each 10   isosorbide mononitrate (IMDUR) 30 MG 24 hr tablet Take 30 mg by mouth daily. (Patient not taking: No sig reported)     lidocaine (LIDODERM) 5 % Place 1 patch onto the skin daily. Remove & Discard patch within 12 hours or as directed by MD 6 patch 0   losartan (COZAAR) 25 MG tablet Take 2 tablets (50 mg total) by mouth daily. 30 tablet 0   metoprolol tartrate (LOPRESSOR) 25 MG tablet Take 1/2 (one-half) tablet by mouth twice daily 30 tablet 0   Multiple  Vitamin (MULTIVITAMIN) tablet Take 1 tablet by mouth daily.     nitroGLYCERIN (NITROSTAT) 0.4 MG SL tablet Place 1 tablet (0.4 mg total) under the tongue every 5 (five) minutes as needed for chest pain. 30 tablet 0   ondansetron (ZOFRAN) 4 MG tablet Take 1 tablet (4 mg total) by mouth daily as needed for nausea or vomiting. 5 tablet 0   pantoprazole (PROTONIX) 40 MG tablet Take 1 tablet (40 mg total) by mouth daily. Reported on 09/13/2015 90 tablet 1   No current facility-administered medications on file prior to visit.    Family History  Problem Relation Age of Onset   Diabetes Sister    Alcohol abuse Sister    Cirrhosis Sister    Mental illness Son        suicidal thoughts   ADD / ADHD Son    Diabetes Father    Cancer Father        Brain   Cirrhosis Mother        alcoholic   Mental illness Brother    Arthritis Sister    Cancer Sister        ?possible gastric cancer   ADD / ADHD Son    ADD / ADHD Son    Mental illness Son        schizophrenia   Blindness Son        Legal blindness    Social History   Socioeconomic History   Marital status: Married    Spouse name: Not on file   Number of children: 4   Years of education: 11   Highest education level: Not on file  Occupational History   Occupation: Forensic psychologist: [employer]  Tobacco Use   Smoking status: Former    Years: 17.00    Types: Cigarettes    Quit date: 08/03/1995    Years since quitting: 25.6   Smokeless tobacco: Never  Substance and Sexual Activity   Alcohol use: No   Drug use: No   Sexual activity: Not on file  Other Topics Concern   Not on file  Social History Narrative   Patient lives at home with her 2 children.    Patient has 4 children.    Patient has currently working part time.    Patient has 11th grade education.    Patient has a common law marriage   Poor diet, often eating only once daily.     Lot of junk food.   Social Determinants of Health   Financial Resource  Strain: Not on file  Food Insecurity: Not on file  Transportation Needs: Not on file  Physical Activity: Not on file  Stress: Not on file  Social Connections: Not on file  Intimate Partner Violence: Not on file    Review of Systems  Constitutional:  Negative for chills and fever.  Eyes:  Negative for blurred vision and double vision.  Respiratory:  Negative for shortness of breath and wheezing.   Cardiovascular:  Negative for chest pain, palpitations and leg swelling.  Gastrointestinal:  Negative for abdominal pain, constipation, diarrhea, heartburn, nausea and vomiting.  Genitourinary:  Negative for dysuria, frequency and urgency.  Neurological:  Negative for dizziness and headaches.    Vitals:   04/02/21 1531  BP: (!) 172/90  Pulse: 99  Temp: 98.4 F (36.9 C)  TempSrc: Oral  SpO2: 99%  Weight: 136 lb 3.2 oz (61.8 kg)  Height: 5' (1.524 m)     Physical Exam Constitutional:      Appearance: Normal appearance.  HENT:     Head: Normocephalic and atraumatic.  Cardiovascular:     Rate and Rhythm: Tachycardia present.     Heart sounds: Normal heart sounds, S1 normal and S2 normal.  Pulmonary:     Effort: Pulmonary effort is normal.     Breath sounds: Normal breath sounds and air entry.  Abdominal:     General: Bowel sounds are normal.     Palpations: Abdomen is soft.     Tenderness: There is no abdominal tenderness. There is no guarding.  Musculoskeletal:     Comments: Splint on left arm.  Skin:    General: Skin is warm and dry.  Neurological:     General: No focal deficit present.     Mental Status: She is alert.  Psychiatric:        Attention and Perception: Attention normal.        Mood and Affect: Mood normal.        Behavior: Behavior normal. Behavior is cooperative.      Assessment & Plan:   See Encounters Tab for problem based charting.  Patient seen with Dr. Rachael Fee, M.D. [facility], PGY-1 Pager: [phone],  Phone: [phone] Date 04/02/2021 Time 3:50 PM

## 2021-04-02 NOTE — Assessment & Plan Note (Signed)
Cassie Mata's blood pressure today was 172/90.  She admits that she has not taken her blood pressure medication today.  She was noted to be tachycardic on physical exam. She is currently not at goal.  PLAN: Continue current BP medication regimen. Follow-up in 3 months

## 2021-04-02 NOTE — Assessment & Plan Note (Signed)
Patient will have was found to have prerenal AKI during hospital course 03/22/2021 - 03/24/2021.  Today Cassie Mata states she has increased her fluid intake.  She denies any episodes of vomiting.  No volume loss since discharge 03/24/2021.   PLAN: BMP ordered today to assess kidney function. I will call the patient with results.

## 2021-04-03 LAB — BMP8+ANION GAP
Anion Gap: 17 mmol/L (ref 10.0–18.0)
BUN/Creatinine Ratio: 19 (ref 9–23)
BUN: 18 mg/dL (ref 6–24)
CO2: 23 mmol/L (ref 20–29)
Calcium: 8.6 mg/dL — ABNORMAL LOW (ref 8.7–10.2)
Chloride: 95 mmol/L — ABNORMAL LOW (ref 96–106)
Creatinine, Ser: 0.96 mg/dL (ref 0.57–1.00)
Glucose: 431 mg/dL — ABNORMAL HIGH (ref 65–99)
Potassium: 4.1 mmol/L (ref 3.5–5.2)
Sodium: 135 mmol/L (ref 134–144)
eGFR: 69 mL/min/{1.73_m2} (ref >59)

## 2021-04-03 LAB — PHOSPHORUS: Phosphorus: 4 mg/dL (ref 3.0–4.3)

## 2021-04-03 LAB — PTH, INTACT AND CALCIUM
Calcium: 8.8 mg/dL (ref 8.7–10.2)
PTH: 27 pg/mL (ref 15–65)

## 2021-04-04 ENCOUNTER — Other Ambulatory Visit: Payer: Self-pay

## 2021-04-04 ENCOUNTER — Encounter: Payer: Self-pay | Admitting: Orthopaedic Surgery

## 2021-04-04 ENCOUNTER — Ambulatory Visit (INDEPENDENT_AMBULATORY_CARE_PROVIDER_SITE_OTHER): Payer: 59 | Admitting: Orthopaedic Surgery

## 2021-04-04 ENCOUNTER — Ambulatory Visit (INDEPENDENT_AMBULATORY_CARE_PROVIDER_SITE_OTHER): Payer: 59

## 2021-04-04 VITALS — Ht 60.0 in | Wt 136.0 lb

## 2021-04-04 DIAGNOSIS — S62357A Nondisplaced fracture of shaft of fifth metacarpal bone, left hand, initial encounter for closed fracture: Secondary | ICD-10-CM

## 2021-04-04 NOTE — Progress Notes (Signed)
Office Visit Note   Patient: Cassie Mata           Date of Birth: 02/12/1966           MRN: [id number] Visit Date: 04/04/2021              Requested by: No referring provider defined for this encounter. PCP: Pcp, No   Assessment & Plan: Visit Diagnoses:  1. Closed nondisplaced fracture of shaft of fifth metacarpal bone of left hand, initial encounter     Plan: 1 month post injury.  Has been wearing the ulnar gutter splint and has developed some stiffness across the MP joints of the index and little finger.  X-rays reveal very nice position of the fracture with some early callus.  Very minimal tenderness about the fracture.  There is no malalignment of her hand.  She will discontinue the splint and we will start some physical therapy and check her back in a month.  She is having little bit of stiffness at the metacarpal phalangeal joint more of the little than the ring finger  Follow-Up Instructions: Return in about 1 month (around 05/05/2021).   Orders:  Orders Placed This Encounter  Procedures   XR Hand Complete Left   Ambulatory referral to Occupational Therapy   No orders of the defined types were placed in this encounter.     Procedures: No procedures performed   Clinical Data: No additional findings.   Subjective: Chief Complaint  Patient presents with   Left Hand - Follow-up    5th metacarpal fracture  Patient presents today for follow up on her left hand. She is now one month out from injury of her fifth metacarpal on the left hand. She is still wearing her splint. She said that her pain is a level 4. She is not taking anything for pain.  HPI  Review of Systems   Objective: Vital Signs: Ht 5' (1.524 m)   Wt 136 lb (61.7 kg)   LMP 09/06/2015 (Exact Date)   BMI 26.56 kg/m   Physical Exam  Ortho Exam left hand without any obvious deformity.  There is still little bit of tenderness at the base of the fifth metacarpal where the fracture is located.   Normal sensation to her little and ring finger and good capillary refill.  She can almost make a full fist touching the tip of the ring finger to the palm rehab but lacks about a fingerbreadth and a half with a little finger.  We will try a course of physical therapy and just check her back in a month.  Think she is going to do just fine  Specialty Comments:  No specialty comments available.  Imaging: XR Hand Complete Left  Result Date: 04/04/2021 Films of the left hand were obtained in 3 projections.  This been a prior oblique fracture at the base of the little metacarpal in good position.  There appears to be some callus but very nice position in both AP and lateral projections.  There is been no change in the original films    PMFS History: Patient Active Problem List   Diagnosis Date Noted   Constipation    Vomiting in adult 03/22/2021   AKI (acute kidney injury) (HCC) 03/22/2021   Metacarpal bone fracture 03/13/2021   Diabetes mellitus type II, uncontrolled (HCC) 08/08/2015   Essential hypertension 08/08/2015   GERD (gastroesophageal reflux disease) 08/08/2015   Chronic low back pain with sciatica 08/08/2015   Arthritis 08/08/2015  Depression 06/15/2015   Insomnia 08/07/2014   Myalgia and myositis 01/13/2013   Past Medical History:  Diagnosis Date   Arthritis    Asthma    Cataract    NS OU   CHF (congestive heart failure) (HCC)    Coma (HCC)    Depression    Diabetes mellitus    Diabetic retinopathy (HCC)    OU   Fibromyalgia    Gall stone    Gastroesophageal reflux disease    Hiatal hernia    Insomnia 08/07/2014   Myalgia and myositis, unspecified 01/13/2013   Stroke (HCC) 2007   Pt. evaluated at [facility] had left sided weakness.  Reportedly no cerebral imaging ever done.  Records we have received from [facility] dated 2011 do not document this.  Rest of records in storage and have not yet sent.    Family History  Problem Relation Age of  Onset   Diabetes Sister    Alcohol abuse Sister    Cirrhosis Sister    Mental illness Son        suicidal thoughts   ADD / ADHD Son    Diabetes Father    Cancer Father        Brain   Cirrhosis Mother        alcoholic   Mental illness Brother    Arthritis Sister    Cancer Sister        ?possible gastric cancer   ADD / ADHD Son    ADD / ADHD Son    Mental illness Son        schizophrenia   Blindness Son        Legal blindness    Past Surgical History:  Procedure Laterality Date   CESAREAN SECTION  1987,1988,1996,2006   Social History   Occupational History   Occupation: Scientist, research (medical): [employer]  Tobacco Use   Smoking status: Former    Years: 17.00    Types: Cigarettes    Quit date: 08/03/1995    Years since quitting: 25.6   Smokeless tobacco: Never  Substance and Sexual Activity   Alcohol use: No   Drug use: No   Sexual activity: Not on file

## 2021-04-05 ENCOUNTER — Telehealth: Payer: Self-pay | Admitting: Orthopaedic Surgery

## 2021-04-05 ENCOUNTER — Telehealth: Payer: Self-pay

## 2021-04-05 NOTE — Telephone Encounter (Signed)
Tried patient again. No answer. Left message to call back.

## 2021-04-05 NOTE — Telephone Encounter (Signed)
Tried to call patient back. No answer. Left message for patient to return call so that we can assist her.

## 2021-04-05 NOTE — Telephone Encounter (Signed)
Patient called. She said that she is having a lot of pain in her ring and pinky finger of the left hand. She said that she is wondering if therapy will actually be beneficial or not. She wants to know if she should have an MRI or not.  Please Advise. Call back # [phone]

## 2021-04-05 NOTE — Telephone Encounter (Signed)
Patient called. She missed a call from [employer]. Would like a call back. [phone]

## 2021-04-05 NOTE — Telephone Encounter (Signed)
PT called back please return when available.

## 2021-04-05 NOTE — Telephone Encounter (Signed)
Pt calling wanting to talk about the pain she is having in her hands.   CB [phone]

## 2021-04-05 NOTE — Telephone Encounter (Signed)
Tried patient again. No answer. Left message for her to call back with more information next time since we keep missing each other.

## 2021-04-07 NOTE — Progress Notes (Signed)
Internal Medicine Clinic Attending  I saw and evaluated the patient.  I personally confirmed the key portions of the history and exam documented by Dr. Ruben Im and I reviewed pertinent patient test results.  The assessment, diagnosis, and plan were formulated together and I agree with the documentation in the resident's note.  Cassie Mata isn't yet grasping the importance of management of her uncontrolled HTN and DM2; she will need ongoing support, encouragement, and education so that she can participate in her own health.

## 2021-04-08 NOTE — Telephone Encounter (Signed)
Stiffnes in her fingers related to the period of immobilization-motion,movement best treatment-no need for an MRI at this point. Have her come in this week if any further concerns

## 2021-04-08 NOTE — Telephone Encounter (Signed)
Called and spoke with patient regarding what Dr.Whitfield recommended. She has not heard from OT yet. I gave her the number to contact them about scheduling. She does not want to come into the office for a recheck this week at this point.

## 2021-04-08 NOTE — Telephone Encounter (Signed)
Saw Cassie Mata last week and ordered OT for ROM-please check on the referral

## 2021-04-09 ENCOUNTER — Telehealth: Payer: Self-pay | Admitting: *Deleted

## 2021-04-09 NOTE — Telephone Encounter (Signed)
Information was sent through CoverMyMeds to [employer] for PA for Lidocaine 5% Patches.  Awaiting determination within 72 hours.  Angelina Ok, RN 04/09/2021 4:18 PM.

## 2021-04-10 NOTE — Telephone Encounter (Signed)
Patient's insurance called in stating they would not be covering the lidocaine patches. They only cover for cancer, shingles, herpes, or if patient has tried and failed OTC 4% patches.

## 2021-04-11 ENCOUNTER — Ambulatory Visit (INDEPENDENT_AMBULATORY_CARE_PROVIDER_SITE_OTHER): Payer: 59 | Admitting: Pharmacist

## 2021-04-11 ENCOUNTER — Ambulatory Visit (INDEPENDENT_AMBULATORY_CARE_PROVIDER_SITE_OTHER): Payer: 59 | Admitting: Dietician

## 2021-04-11 ENCOUNTER — Encounter: Payer: Self-pay | Admitting: Dietician

## 2021-04-11 ENCOUNTER — Other Ambulatory Visit: Payer: Self-pay

## 2021-04-11 DIAGNOSIS — E1165 Type 2 diabetes mellitus with hyperglycemia: Secondary | ICD-10-CM | POA: Diagnosis not present

## 2021-04-11 NOTE — Patient Instructions (Signed)
Ms. Gaumond it was a pleasure seeing you today.   Please do the following:  I will call you regarding the insulins you can pick up at the pharmacy based on the cost Continue checking blood sugars at home. It's really important that you record these and bring these in to your next doctor's appointment.  Continue making the lifestyle changes we've discussed together during our visit. Diet and exercise play a significant role in improving your blood sugars.  Follow-up with me in two weeks.   Hypoglycemia or low blood sugar:   Low blood sugar can happen quickly and may become an emergency if not treated right away.   While this shouldn't happen often, it can be brought upon if you skip a meal or do not eat enough. Also, if your insulin or other diabetes medications are dosed too high, this can cause your blood sugar to go to low.   Warning signs of low blood sugar include: Feeling shaky or dizzy Feeling weak or tired  Excessive hunger Feeling anxious or upset  Sweating even when you aren't exercising  What to do if I experience low blood sugar? Follow the Rule of 15 Check your blood sugar with your meter. If lower than 70, proceed to step 2.  Treat with 15 grams of fast acting carbs which is found in 3-4 glucose tablets. If none are available you can try hard candy, 1 tablespoon of sugar or honey,4 ounces of fruit juice, or 6 ounces of REGULAR soda.  Re-check your sugar in 15 minutes. If it is still below 70, do what you did in step 2 again. If your blood sugar has come back up, go ahead and eat a snack or small meal made up of complex carbs (ex. Whole grains) and protein at this time to avoid recurrence of low blood sugar.

## 2021-04-11 NOTE — Progress Notes (Signed)
   Subjective:    Patient ID: Cassie Mata, female    DOB: 11-Apr-1966, 55 y.o.   MRN: [id number]  HPI Patient is a 55 y.o. female who presents for diabetes management. She is in good spirits and presents without assistance with family present. Patient was referred and last seen by provider, Dr. Ruben Im, on 04/02/21.  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 She reports dizziness. Patient states that She is taking her medications as prescribed.   Do you feel that your medications are working for you?  no  Have you been experiencing any side effects to the medications prescribed? no  Do you have any problems obtaining medications due to transportation or finances?  yes     Patient denies hypoglycemic events. Patient denies polyuria (increased urination).  Patient denies polyphagia (increased appetite).  Patient denies polydipsia (increased thirst).  Patient denies neuropathy (nerve pain). Patient reports visual changes; patient has history of retinopathy  Patient reports self foot exams.   Home fasting blood sugars: 227 2 hour post-meal/random blood sugars: 400's   Objective:   Labs:   Physical Exam Neurological:     Mental Status: She is alert and oriented to person, place, and time.    Review of Systems  Gastrointestinal:  Negative for nausea and vomiting.  Genitourinary:  Negative for frequency.   Lab Results  Component Value Date   HGBA1C >14.0 (A) 04/02/2021   HGBA1C 15.2 (H) 03/23/2021   HGBA1C 13.9 (H) 10/20/2017    Lipid Panel  No results found for: CHOL, TRIG, HDL,  CHOLHDL, VLDL, LDLCALC, LDLDIRECT  Clinical Atherosclerotic Cardiovascular Disease (ASCVD): Yes  The ASCVD Risk score DC Jr., et al., 2013) failed to calculate for the following reasons:   The patient has a prior MI or stroke diagnosis    Assessment/Plan:   T2DM is not controlled likely due to patient being unable to afford medications. Medication adherence appears optimal. Will provide samples of insulins until a solution to cost can be found.Patient educated on purpose, proper use and potential adverse effects of 03/25/2021.  Following instruction patient verbalized understanding of treatment plan.    Gave samples of basal insulin degludec 12/18/2017) 10 units once daily in AM.  Gave samples of  rapid insulin aspart (Fiasp) 10 units TID with meals. Will send prescription to pharmacy to run claim for submission of patient assistance for Jardiance 10mg  once daily. Extensively discussed pathophysiology of diabetes, dietary effects on blood sugar control, and recommended lifestyle interventions. Counseled on s/sx of and management of hypoglycemia Next A1C anticipated October 2022.   Follow-up appointment one week to review sugar readings. Written patient instructions provided.  Of note patient does not have PCP but no clear indication for Advair. Patient previously prescribed and has been taking prn for allergy symptoms. Has not taken in quite some time. I would not recommend patient take Advair for allergies with no history of asthma or COPD.  This appointment required 35 minutes of direct patient care.  Thank you for involving pharmacy to assist in providing this patient's care.

## 2021-04-11 NOTE — Progress Notes (Signed)
Medical Nutrition Therapy:  Appt start time: 1600 end time:  1630. Total time: 30 Visit # 1   Assessment:  Primary concerns today: meal planning for her diabetes.  Cassie Mata is accompanied by 2 family members today. She would like more information on meal planning to help stabilize her blood sugar. She states that she is taking glipizide 5 mg twice a day. She also states her fasting blood sugar is ~ 227 and post breakfast 457. She reads labels for sodium du to her congestive heart failure. She is aware of some foods that contain carbohydrates Preferred Learning Style: No preference indicated  Learning Readiness: Contemplating/Ready/Change in progress  ANTHROPOMETRICS: Estimated body mass index is 26.56 kg/m as calculated from the following:   Height as of 04/04/21: 5' (1.524 m).   Weight as of 04/04/21: 136 lb (61.7 kg).  WEIGHT HISTORY:  Wt Readings from Last 10 Encounters:  04/04/21 136 lb (61.7 kg)  04/02/21 136 lb 3.2 oz (61.8 kg)  03/24/21 127 lb 1.6 oz (57.7 kg)  03/13/21 151 lb (68.5 kg)  04/15/20 151 lb (68.5 kg)  10/20/17 149 lb (67.6 kg)  10/06/17 150 lb (68 kg)  09/13/15 155 lb (70.3 kg)  08/01/15 160 lb (72.6 kg)  12/08/14 171 lb (77.6 kg)   SLEEP:states she has insomnia and sleeps from 11pm-12 am until 3 am, gets out of bed 6 am  MEDICATIONS:  Current Outpatient Medications on File Prior to Visit  Medication Sig Dispense Refill   AgaMatrix Ultra-Thin Lancets MISC Check sugars twice daily before meals 100 each 0   albuterol (PROVENTIL HFA;VENTOLIN HFA) 108 (90 Base) MCG/ACT inhaler Inhale 1-2 puffs into the lungs every 6 (six) hours as needed for wheezing. (Patient not taking: Reported on 04/11/2021) 1 Inhaler 0   aspirin 81 MG tablet Take 81 mg by mouth daily.      atorvastatin (LIPITOR) 80 MG tablet Take 80 mg by mouth daily.     Blood Glucose Monitoring Suppl (AGAMATRIX PRESTO) w/Device KIT Check blood sugars twice daily before meals 1 kit 0   cholecalciferol  (VITAMIN D) 1000 UNITS tablet Take 2,000 Units by mouth at bedtime.      cyclobenzaprine (FLEXERIL) 10 MG tablet Take 1 tablet (10 mg total) by mouth 2 (two) times daily as needed for muscle spasms. (Patient not taking: Reported on 04/11/2021) 20 tablet 0   dapagliflozin propanediol (FARXIGA) 10 MG TABS tablet Take 1 tablet (10 mg total) by mouth daily. (Patient not taking: Reported on 04/11/2021) 30 tablet 0   diphenhydrAMINE (BENADRYL) 25 mg capsule Take 25 mg by mouth every 6 (six) hours as needed for allergies. (Patient not taking: Reported on 04/11/2021)     Fluticasone-Salmeterol (ADVAIR) 250-50 MCG/DOSE AEPB 1 inhalation twice daily followed by brushing teeth and tongue 1 each 11   furosemide (LASIX) 40 MG tablet Take 1 tablet (40 mg total) by mouth daily for 5 days. (Patient not taking: Reported on 03/22/2021) 5 tablet 0   glipiZIDE (GLUCOTROL) 5 MG tablet TAKE 1 TABLET BY MOUTH TWICE DAILY WITH MEALS 60 tablet 0   glucose blood (AGAMATRIX PRESTO TEST) test strip Check sugars twice daily before meals 100 each 12   ibuprofen (ADVIL) 200 MG tablet Take 200 mg by mouth daily as needed for headache or mild pain. (Patient not taking: Reported on 04/11/2021)     insulin aspart (NOVOLOG) 100 UNIT/ML FlexPen Inject 10 Units into the skin 3 (three) times daily with meals. 15 mL 11   insulin  glargine (LANTUS) 100 UNIT/ML injection Inject 0.1 mLs (10 Units total) into the skin at bedtime. 10 mL 11   Insulin Syringes, Disposable, U-100 0.3 ML MISC 1 Syringe by Does not apply route as needed. 100 each 10   isosorbide mononitrate (IMDUR) 30 MG 24 hr tablet Take 30 mg by mouth daily. (Patient not taking: Reported on 04/11/2021)     lidocaine (LIDODERM) 5 % Place 1 patch onto the skin daily. Remove & Discard patch within 12 hours or as directed by MD 6 patch 0   losartan (COZAAR) 25 MG tablet Take 2 tablets (50 mg total) by mouth daily. 30 tablet 0   metoprolol tartrate (LOPRESSOR) 25 MG tablet Take 1/2 (one-half)  tablet by mouth twice daily 30 tablet 0   Multiple Vitamin (MULTIVITAMIN) tablet Take 1 tablet by mouth daily.     nitroGLYCERIN (NITROSTAT) 0.4 MG SL tablet Place 1 tablet (0.4 mg total) under the tongue every 5 (five) minutes as needed for chest pain. (Patient not taking: Reported on 04/11/2021) 30 tablet 0   ondansetron (ZOFRAN) 4 MG tablet Take 1 tablet (4 mg total) by mouth daily as needed for nausea or vomiting. 5 tablet 0   pantoprazole (PROTONIX) 40 MG tablet Take 1 tablet (40 mg total) by mouth daily. Reported on 09/13/2015 90 tablet 1   No current facility-administered medications on file prior to visit.    BLOOD SUGAR:she states she checks only in the am, before and after breakfast. Offered sample of personal CGM or to ask the doctor about placing a professional CGM but she was not interested today.  Lab Results  Component Value Date   HGBA1C >14.0 (A) 04/02/2021   HGBA1C 15.2 (H) 03/23/2021   HGBA1C 13.9 (H) 10/20/2017     DIETARY INTAKE: Usual eating pattern includes 3 meals and 2 snacks per day. Everyday foods include brown rice, brown bread, fruit, vegetables,   Avoided foods include fried foods, eggs Dining Out (times/week): 7+ 24-hr recall:  B ( 6 AM): oatmeal, banana,  or ensure or glucenra or apple/ peach/banana Snk ( AM): fruit or water to gatorade L ( PM): 12-1 skips 3 days a week Snk ( PM): apple D ( PM): usually from a restaurant- chik fil a salad with chicken or fish, slaw and cabbage and Mt dew Beverages: water, gatorade, 1/2 can ginger ale or 1/23 bottel of mt dew  Usual physical activity: need to assess at future visit   Progress Towards Goal(s):  In progress.   Nutritional Diagnosis:  NB-1.1 Food and nutrition-related knowledge deficit As related to lack of sufficient prior diabetes meal planning education .  As evidenced by her report and inability to anem food groups high in carbs.    Intervention:  Nutrition education about food groups that contain  carbs and that should b eaten in consistent portions to help control blood sugar. Action Goal: eat a consistent amount of carbs throughout the day- 25-45 grams at meals, 15-25 for snack in between  Outcome goal: im[proved knowledge about diabetes meal plannning and blood sugars Coordination of care:   Teaching Method Utilized: Visual, Auditory,Hands on Handouts given during visit include:meal planning and carb counting for diabetes  Barriers to learning/adherence to lifestyle change: competing values/priorities Anticipate 3-6 visits to master concepts Demonstrated degree of understanding via:  Teach Back   Monitoring/Evaluation:  Dietary intake, exercise, meter, and body weight in 2 week(s) Debera Lat, RD 04/11/2021 5:20 PM. .

## 2021-04-12 ENCOUNTER — Other Ambulatory Visit (HOSPITAL_COMMUNITY): Payer: Self-pay

## 2021-04-12 ENCOUNTER — Telehealth: Payer: Self-pay | Admitting: Pharmacist

## 2021-04-12 MED ORDER — INSULIN GLARGINE-YFGN 100 UNIT/ML ~~LOC~~ SOPN: 10.0000 [IU] | PEN_INJECTOR | Freq: Every day | SUBCUTANEOUS | 0 refills | Status: DC

## 2021-04-12 MED ORDER — INSULIN ASPART (W/NIACINAMIDE) 100 UNIT/ML ~~LOC~~ SOPN: 10.0000 [IU] | PEN_INJECTOR | Freq: Three times a day (TID) | SUBCUTANEOUS | 0 refills | Status: DC

## 2021-04-12 MED ORDER — EMPAGLIFLOZIN 10 MG PO TABS
10.0000 mg | ORAL_TABLET | Freq: Every day | ORAL | 0 refills | Status: DC
  Filled 2021-04-12: qty 30, 30d supply, fill #0

## 2021-04-12 MED ORDER — INSULIN DEGLUDEC 100 UNIT/ML ~~LOC~~ SOPN: 10.0000 [IU] | PEN_INJECTOR | Freq: Every day | SUBCUTANEOUS | 0 refills | Status: DC

## 2021-04-12 NOTE — Assessment & Plan Note (Signed)
T2DM is not controlled likely due to patient being unable to afford medications. Medication adherence appears optimal. Will provide samples of insulins until a solution to cost can be found.Patient educated on purpose, proper use and potential adverse effects of [place].  Following instruction patient verbalized understanding of treatment plan.    1. Gave samples of basal insulin degludec Evaristo Bury) 10 units once daily in AM.  2. Gave samples of  rapid insulin aspart (Fiasp) 10 units TID with meals. 3. Will send prescription to pharmacy to run claim for submission of patient assistance for Jardiance 10mg  once daily. 4. Extensively discussed pathophysiology of diabetes, dietary effects on blood sugar control, and recommended lifestyle interventions. 5. Counseled on s/sx of and management of hypoglycemia 6. Next A1C anticipated October 2022.   Follow-up appointment one week to review sugar readings. Written patient instructions provided.

## 2021-04-12 NOTE — Telephone Encounter (Signed)
Patient called clinic to report her blood glucose was 386 this morning. She reports taking 10 units of Tresiba and then 10 units of Fiasp before she ate a fruit flavored oatmeal pack with some 2% milk. She stated shortly after taking insulin and eating she began feeling "funny and lightheaded." She did not check her blood glucose but ate some cake to make herself feel better. She reports waiting 15 minutes and then checking her blood glucose again and it was 106. She states she began feeling better.   Will have patient stop taking bolus insulin and work on first titrating up her basal insulin. Samples were provided in office visit yesterday. Patient instructed to continue to take 10 units once daily in the morning and then titrate by 2 units every 3 days if fasting blood glucose >150.   Follow-up visit scheduled in one week.

## 2021-04-15 ENCOUNTER — Ambulatory Visit: Payer: 59 | Admitting: Occupational Therapy

## 2021-04-18 ENCOUNTER — Encounter: Payer: Self-pay | Admitting: Occupational Therapy

## 2021-04-18 ENCOUNTER — Ambulatory Visit: Payer: 59 | Attending: Orthopaedic Surgery | Admitting: Occupational Therapy

## 2021-04-18 ENCOUNTER — Other Ambulatory Visit: Payer: Self-pay

## 2021-04-18 DIAGNOSIS — M79642 Pain in left hand: Secondary | ICD-10-CM | POA: Diagnosis present

## 2021-04-18 DIAGNOSIS — M25642 Stiffness of left hand, not elsewhere classified: Secondary | ICD-10-CM | POA: Insufficient documentation

## 2021-04-18 DIAGNOSIS — R600 Localized edema: Secondary | ICD-10-CM | POA: Diagnosis present

## 2021-04-18 DIAGNOSIS — M6281 Muscle weakness (generalized): Secondary | ICD-10-CM | POA: Diagnosis present

## 2021-04-18 NOTE — Therapy (Signed)
[facility] [address] [place], [place], [address] Phone: [phone]   Fax:  [phone]  Occupational Therapy Evaluation  Patient Details  Name: Cassie Mata MRN: [id number] Date of Birth: 03/14/1966 Referring Provider (OT): Dr Cleophas Dunker   Encounter Date: 04/18/2021   OT End of Session - 04/18/21 1801     Visit Number 1    Number of Visits 7    Date for OT Re-Evaluation 07/02/21    Authorization Type [employer], VL- 30 COMBINED    OT Start Time 1705    OT Stop Time 1745    OT Time Calculation (min) 40 min    Activity Tolerance Patient limited by pain    Behavior During Therapy [facility] for tasks assessed/performed             Past Medical History:  Diagnosis Date   Arthritis    Asthma    Cataract    NS OU   CHF (congestive heart failure) (HCC)    Coma (HCC)    Depression    Diabetes mellitus    Diabetic retinopathy (HCC)    OU   Fibromyalgia    Gall stone    Gastroesophageal reflux disease    Hiatal hernia    Insomnia 08/07/2014   Myalgia and myositis, unspecified 01/13/2013   Stroke (HCC) 2007   Pt. evaluated at [facility] had left sided weakness.  Reportedly no cerebral imaging ever done.  Records we have received from [facility] dated 2011 do not document this.  Rest of records in storage and have not yet sent.    Past Surgical History:  Procedure Laterality Date   CESAREAN SECTION  [phone]    There were no vitals filed for this visit.   Subjective Assessment - 04/18/21 1757     Subjective  I think they took me out of the splint too early    Patient is accompanied by: Family member    Currently in Pain? Yes    Pain Score 7     Pain Location Hand    Pain Orientation Left    Pain Descriptors / Indicators Aching    Pain Type Acute pain    Pain Onset More than a month ago    Pain Frequency Constant    Aggravating Factors  Wringing out a washcloth,  opening packages               [facility] OT Assessment - 04/18/21 0001       Assessment   Medical Diagnosis closed nondisplaced fracture of shaft of fifth metacarpal bone of left hand    Referring Provider (OT) Dr Gwinda Passe Dominance Right    Prior Therapy NA      Restrictions   Other Position/Activity Restrictions No heavy lifting      Prior Function   Level of Independence Independent with basic ADLs    Vocation Full time employment    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    Leisure Out to eat, watch TV,      ADL   Eating/Feeding Modified independent    Grooming Modified independent    Upper Body Bathing Independent    Lower Body Bathing Independent    Upper Body Dressing Independent    Lower Body Dressing Needs assist for fasteners    Toilet Transfer Independent      IADL   Prior Level of Function Light Housekeeping Independent    Light Housekeeping --  Pain with replacing linens   Prior Level of Function Meal Prep Independent    Meal Prep --   dropping items, difficulty holding     Written Expression   Dominant Hand Right      Posture/Postural Control   Posture/Postural Control No significant limitations      Sensation   Light Touch Appears Intact    Hot/Cold Appears Intact      Coordination   9 Hole Peg Test Right;Left    Right 9 Hole Peg Test 23.19    Left 9 Hole Peg Test 23.97      Edema   Edema mild edema over 4th/5th metacarpal dorsal and volar      ROM / Strength   AROM / PROM / Strength AROM      AROM   Overall AROM  Deficits    Overall AROM Comments Limited MCP, flexion 4th and 5th digits      Hand Function   Right Hand Gross Grasp Functional    Right Hand Grip (lbs) 50.7    Right Hand Lateral Pinch 12 lbs    Left Hand Gross Grasp Impaired    Left Hand Grip (lbs) 11.6    Left Hand Lateral Pinch 4 lbs                             OT Education - 04/18/21 1800     Education Details educated in edema massage and  HEP for composite flexion    Person(s) Educated Patient    Methods Explanation;Demonstration;Tactile cues    Comprehension Verbalized understanding;Returned demonstration              OT Short Term Goals - 04/18/21 1807       OT SHORT TERM GOAL #1   Title Patient will complete an HEP designed to improve range of motion in LUE    Time 4    Period Weeks    Status New    Target Date 06/02/21      OT SHORT TERM GOAL #2   Title Patient will demonstrate effective edema and pain management techniques    Time 4    Period Weeks    Status New               OT Long Term Goals - 04/18/21 1808       OT LONG TERM GOAL #1   Title Patient will complete updated HEP to address range of motion and strength in LUE    Time 6    Period Weeks    Status New    Target Date 06/17/21      OT LONG TERM GOAL #2   Title Patient will report no increase in pain when wringing out washcloth with BUE    Time 6    Period Weeks    Status New      OT LONG TERM GOAL #3   Title Patient will demonstrate 10 lb increase in grip strength in left hand    Time 6    Period Weeks    Status New      OT LONG TERM GOAL #4   Title Patient will be able to use left hand to 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  Patient presents during OT eval with pain, mild edema, stiffness in 4th and 5th fingers.  Patient feels pain and stiffness limits her ability to complete ADL/IADL, work related tasks.  Patient will benefit from skilled OT intervention to improve functional use of non-dominant left UE    OT Occupational Profile and History Problem Focused Assessment - Including review of records relating to presenting problem     Occupational performance deficits (Please refer to evaluation for details): ADL's;IADL's;Work    Body Structure / Function / Physical Skills ADL;Decreased knowledge of use of DME;Strength;Pain;Edema;UE functional use;IADL;ROM;Flexibility;Mobility;Decreased knowledge of precautions    Rehab Potential Good    Clinical Decision Making Limited treatment options, no task modification necessary    Comorbidities Affecting Occupational Performance: None    Modification or Assistance to Complete Evaluation  No modification of tasks or assist necessary to complete eval    OT Frequency 1x / week    OT Duration 6 weeks    OT Treatment/Interventions Self-care/ADL training;Moist Heat;Fluidtherapy;DME and/or AE instruction;Splinting;Therapeutic activities;Ultrasound;Therapeutic exercise;Cryotherapy;Passive range of motion;Patient/family education;Manual Therapy;Paraffin    Plan Pain management - heat or ice probe as warranted.  Review edema management, increase end range MCP flexion 4th/5th.  Light strengthening    Consulted and Agree with Plan of Care Patient             Patient will benefit from skilled therapeutic intervention in order to improve the following deficits and impairments:   Body Structure / Function / Physical Skills: ADL, Decreased knowledge of use of DME, Strength, Pain, Edema, UE functional use, IADL, ROM, Flexibility, Mobility, Decreased knowledge of precautions       Visit Diagnosis: Pain in left hand - Plan: Ot plan of care cert/re-cert  Stiffness of left hand, not elsewhere classified - Plan: Ot plan of care cert/re-cert  Localized edema - Plan: Ot plan of care cert/re-cert  Muscle weakness (generalized) - Plan: Ot plan of care cert/re-cert    Problem List Patient Active Problem List   Diagnosis Date Noted   Constipation    Vomiting in adult 03/22/2021   AKI (acute kidney injury) (HCC) 03/22/2021   Metacarpal bone fracture 03/13/2021   Diabetes mellitus type II,  uncontrolled (HCC) 08/08/2015   Essential hypertension 08/08/2015   GERD (gastroesophageal reflux disease) 08/08/2015   Chronic low back pain with sciatica 08/08/2015   Arthritis 08/08/2015   Depression 06/15/2015   Insomnia 08/07/2014   Myalgia and myositis 01/13/2013    Collier Salina 04/18/2021, 6:14 PM  [facility] [address] [place], [place], [address] Phone: [phone]   Fax:  [phone]  Name: Cassie Mata MRN: [id number] Date of Birth: 1966/03/09

## 2021-04-19 ENCOUNTER — Ambulatory Visit (INDEPENDENT_AMBULATORY_CARE_PROVIDER_SITE_OTHER): Payer: 59 | Admitting: Pharmacist

## 2021-04-19 DIAGNOSIS — E1165 Type 2 diabetes mellitus with hyperglycemia: Secondary | ICD-10-CM

## 2021-04-21 ENCOUNTER — Other Ambulatory Visit: Payer: Self-pay | Admitting: Internal Medicine

## 2021-04-24 MED ORDER — PEN NEEDLES 32G X 4 MM MISC: 0 refills | Status: DC

## 2021-04-24 NOTE — Progress Notes (Signed)
Subjective:    Patient ID: EVENY ANASTAS, female    DOB: 14-Mar-1966, 55 y.o.   MRN: [id number]  HPI Patient is a 55 y.o. female who presents for diabetes management. She is in good spirits and presents via telephone. Patient was referred and last seen by provider, Dr. Ruben Im, on 04/02/21. Last seen in pharmacy clinic on 04/11/21.   Patient reports she has not been taking her Evaristo Bury for the past few days as she felt there was something wrong with the pen. She states she does not believe any medication is coming out so she didn't want to keep wasting needles.  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  Do you feel that your medications are working for you?  no  Have you been experiencing any side effects to the medications prescribed? no  Do you have any problems obtaining medications due to transportation or finances?  yes     Patient denies hypoglycemic events. Patient denies polyuria (increased urination).  Patient denies polyphagia (increased appetite).  Patient denies polydipsia (increased thirst).  Patient denies neuropathy (nerve pain). Patient reports visual changes; patient has history of retinopathy  Patient reports self foot exams.   Home fasting blood sugars: 200's  Objective:   Labs:   Physical Exam Neurological:     Mental Status: She is alert and oriented to person, place, and time.    Review of Systems  Gastrointestinal:  Negative for nausea and vomiting.  Genitourinary:  Negative for frequency.   Lab  Results  Component Value Date   HGBA1C >14.0 (A) 04/02/2021   HGBA1C 15.2 (H) 03/23/2021   HGBA1C 13.9 (H) 10/20/2017    Lipid Panel  No results found for: CHOL, TRIG, HDL, CHOLHDL, VLDL, LDLCALC, LDLDIRECT  Clinical Atherosclerotic Cardiovascular Disease (ASCVD): Yes  The ASCVD Risk score [name] DC Jr., et al., 2013) failed to calculate for the following reasons:   The patient has a prior MI or stroke diagnosis    Assessment/Plan:   T2DM is not controlled likely due to patient being currently not administering insulin. Medication adherence appears optimal. Walked patient through testing Tresiba pen over the phone and patient states medication did in fact come out of the needle when she tested it outside of her body. Discussed with patient that medication was likely always coming out of pen but that patient is on low dose/volume and may not feel it. Patient educated on purpose, proper use and potential adverse effects of Tresiba. Will continue to hold Nord and titrate up 12/18/2017. Following instruction patient verbalized understanding of treatment plan.    Gave samples of basal insulin degludec Denman George) 10 units once daily in AM. Patient instructed to titrate by 2 units every 3 days if fasting blood glucose >150.  Continued to hold Fiasp Extensively discussed pathophysiology of diabetes, dietary effects on blood sugar control, and recommended lifestyle interventions. Counseled on s/sx of and management of hypoglycemia Next A1C anticipated October 2022.   Follow-up appointment one to two weeks to review sugar readings. Written patient instructions provided.  This appointment required 15 minutes of direct patient care via telephone.  Thank you for involving pharmacy to  assist in providing this patient's care.

## 2021-04-25 ENCOUNTER — Ambulatory Visit: Payer: 59 | Admitting: Occupational Therapy

## 2021-05-01 ENCOUNTER — Encounter: Payer: Self-pay | Admitting: Occupational Therapy

## 2021-05-01 ENCOUNTER — Telehealth: Payer: Self-pay

## 2021-05-01 ENCOUNTER — Ambulatory Visit: Payer: 59 | Admitting: Occupational Therapy

## 2021-05-01 ENCOUNTER — Other Ambulatory Visit: Payer: Self-pay

## 2021-05-01 DIAGNOSIS — R600 Localized edema: Secondary | ICD-10-CM

## 2021-05-01 DIAGNOSIS — M25642 Stiffness of left hand, not elsewhere classified: Secondary | ICD-10-CM

## 2021-05-01 DIAGNOSIS — M79642 Pain in left hand: Secondary | ICD-10-CM

## 2021-05-01 DIAGNOSIS — M6281 Muscle weakness (generalized): Secondary | ICD-10-CM

## 2021-05-01 NOTE — Therapy (Signed)
[facility] [address] [place], [place], [address] Phone: [phone]   Fax:  [phone]  Occupational Therapy Treatment  Patient Details  Name: Cassie Mata MRN: [id number] Date of Birth: 04/06/1966 Referring Provider (OT): Dr Cleophas Dunker   Encounter Date: 05/01/2021   OT End of Session - 05/01/21 1636     Visit Number 2    Number of Visits 7    Date for OT Re-Evaluation 07/02/21    Authorization Type [employer], VL- 30 COMBINED    OT Start Time 1535    OT Stop Time 1615    OT Time Calculation (min) 40 min    Activity Tolerance Patient limited by pain    Behavior During Therapy [facility] for tasks assessed/performed             Past Medical History:  Diagnosis Date   Arthritis    Asthma    Cataract    NS OU   CHF (congestive heart failure) (HCC)    Coma (HCC)    Depression    Diabetes mellitus    Diabetic retinopathy (HCC)    OU   Fibromyalgia    Gall stone    Gastroesophageal reflux disease    Hiatal hernia    Insomnia 08/07/2014   Myalgia and myositis, unspecified 01/13/2013   Stroke (HCC) 2007   Pt. evaluated at [facility] had left sided weakness.  Reportedly no cerebral imaging ever done.  Records we have received from [facility] dated 2011 do not document this.  Rest of records in storage and have not yet sent.    Past Surgical History:  Procedure Laterality Date   CESAREAN SECTION  [phone]    There were no vitals filed for this visit.   Subjective Assessment - 05/01/21 1539     Subjective  I was going to call you all and see about getting pain medicine.    Currently in Pain? Yes    Pain Score 5     Pain Location Hand    Pain Orientation Left    Pain Descriptors / Indicators Aching    Pain Type Acute pain    Pain Onset More than a month ago    Pain Frequency Intermittent    Aggravating Factors  use of hands    Pain Relieving Factors Tylenol at night                 [facility] OT Assessment - 05/01/21 0001       Left Hand AROM   L Ring  MCP 0-90 80 Degrees    L Little  MCP 0-90 70 Degrees      Left Hand PROM   L Ring  MCP 0-90 85 Degrees    L Little  MCP 0-90 90 Degrees      Hand Function   Left Hand Grip (lbs) 17.3                      OT Treatments/Exercises (OP) - 05/01/21 0001       ADLs   Work Patient reports having to adjust how she carries things, and wrings out washcloth due to pain and weakness in left hand.  Patient experiencing pain throughout hand, although pain is not constant - and has slightly lessened from evaluation.    ADL Comments Discussed potential benefit of taking medicine to help with pain during the day.  Patient is taking Extra Strength Tylenol at night only.  Patient reports being veru sensitive to medicine - encouraged her to speak with her MD.      Exercises   Exercises Hand      Hand Exercises   Other Hand Exercises Composite hook, full fist to address range of motion.    Other Hand Exercises Digiflex x 1.5 lb to begin very light strengthening x 3 sets.  Patient with brief but significant  increase in pain with even light strengthening.      Sensation Exercises   Desensitization Worked with massage ball for desensitization of left hand - especially ulnar aspect of hand.  Deeper pressure for digits, palm - worked to patient's tolerance.      Modalities   Modalities Moist Heat      Moist Heat Therapy   Number Minutes Moist Heat 5 Minutes    Moist Heat Location Hand                      OT Short Term Goals - 05/01/21 1638       OT SHORT TERM GOAL #1   Title Patient will complete an HEP designed to improve range of motion in LUE    Time 4    Period Weeks    Status Achieved    Target Date 06/02/21      OT SHORT TERM GOAL #2   Title Patient will demonstrate effective edema and pain management techniques    Time 4    Period Weeks    Status Achieved                OT Long Term Goals - 05/01/21 1638       OT LONG TERM GOAL #1   Title Patient will complete updated HEP to address range of motion and strength in LUE    Time 6    Period Weeks    Status On-going      OT LONG TERM GOAL #2   Title Patient will report no increase in pain when wringing out washcloth with BUE    Time 6    Period Weeks    Status On-going      OT LONG TERM GOAL #3   Title Patient will demonstrate 10 lb increase in grip strength in left hand    Time 6    Period Weeks    Status On-going      OT LONG TERM GOAL #4   Title Patient will be able to 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  Patient encouraged to talk with MD regarding pain medicine that she can safely take.  Range of motion and strength improving.    OT Occupational Profile and History Problem Focused Assessment - Including review of records relating to presenting problem    Occupational performance deficits (Please refer to evaluation for details): ADL's;IADL's;Work    Body Structure / Function / Physical Skills ADL;Decreased knowledge of use of DME;Strength;Pain;Edema;UE functional use;IADL;ROM;Flexibility;Mobility;Decreased knowledge of precautions    Rehab Potential Good    Clinical Decision Making Limited treatment options, no task modification necessary    Comorbidities Affecting Occupational Performance: None  Modification or Assistance to Complete Evaluation  No modification of tasks or assist necessary to complete eval    OT Frequency 1x / week    OT Duration 6 weeks    OT Treatment/Interventions Self-care/ADL training;Moist Heat;Fluidtherapy;DME and/or AE instruction;Splinting;Therapeutic activities;Ultrasound;Therapeutic  exercise;Cryotherapy;Passive range of motion;Patient/family education;Manual Therapy;Paraffin    Plan Pain management - heat as able , increase end range MCP flexion 4th/5th.  Light strengthening    Consulted and Agree with Plan of Care Patient             Patient will benefit from skilled therapeutic intervention in order to improve the following deficits and impairments:   Body Structure / Function / Physical Skills: ADL, Decreased knowledge of use of DME, Strength, Pain, Edema, UE functional use, IADL, ROM, Flexibility, Mobility, Decreased knowledge of precautions       Visit Diagnosis: Pain in left hand  Stiffness of left hand, not elsewhere classified  Localized edema  Muscle weakness (generalized)    Problem List Patient Active Problem List   Diagnosis Date Noted   Constipation    Vomiting in adult 03/22/2021   AKI (acute kidney injury) (HCC) 03/22/2021   Metacarpal bone fracture 03/13/2021   Diabetes mellitus type II, uncontrolled (HCC) 08/08/2015   Essential hypertension 08/08/2015   GERD (gastroesophageal reflux disease) 08/08/2015   Chronic low back pain with sciatica 08/08/2015   Arthritis 08/08/2015   Depression 06/15/2015   Insomnia 08/07/2014   Myalgia and myositis 01/13/2013    Collier Salina 05/01/2021, 4:40 PM   [facility] [address] [place], [place], [address] Phone: [phone]   Fax:  [phone]  Name: Cassie Mata MRN: [id number] Date of Birth: [birth year]

## 2021-05-01 NOTE — Telephone Encounter (Signed)
PA  decision  came through and the Pa was denied  reason being :   It does not meet the plan criteria for  the diagnosis

## 2021-05-01 NOTE — Telephone Encounter (Signed)
the medication was Lidocaine 5% Patches

## 2021-05-14 ENCOUNTER — Telehealth: Payer: Self-pay

## 2021-05-14 ENCOUNTER — Other Ambulatory Visit: Payer: Self-pay | Admitting: Orthopaedic Surgery

## 2021-05-14 ENCOUNTER — Telehealth: Payer: Self-pay | Admitting: Orthopaedic Surgery

## 2021-05-14 ENCOUNTER — Ambulatory Visit: Payer: 59 | Admitting: Orthopaedic Surgery

## 2021-05-14 MED ORDER — INSULIN DEGLUDEC 100 UNIT/ML ~~LOC~~ SOPN: 12.0000 [IU] | PEN_INJECTOR | Freq: Every day | SUBCUTANEOUS | 0 refills | Status: DC

## 2021-05-14 MED ORDER — HYDROCODONE-ACETAMINOPHEN 5-325 MG PO TABS: 1.0000 | ORAL_TABLET | Freq: Two times a day (BID) | ORAL | 0 refills | Status: DC | PRN

## 2021-05-14 NOTE — Telephone Encounter (Signed)
Patient called back stating she was out of her Evaristo Bury and needed a refill. Sent in refill and provided patient instructions for [name] savings card. Patient to call back if not able to afford medication.

## 2021-05-14 NOTE — Telephone Encounter (Signed)
Pt states she is in a lot of pain and would like to know If you can send in some pain medication for her.   CB [phone]

## 2021-05-14 NOTE — Telephone Encounter (Signed)
Requesting to speak with a nurse about insulin, please call back.  

## 2021-05-14 NOTE — Telephone Encounter (Signed)
Sent in hydrocodone to [facility]

## 2021-05-14 NOTE — Telephone Encounter (Signed)
Returned call to patient. No answer. Left message on VM requesting return call. Will also forward to Dr. Nicholaus Bloom as she has been assisting patient with diabetes management.

## 2021-05-16 ENCOUNTER — Ambulatory Visit: Payer: 59 | Admitting: Occupational Therapy

## 2021-05-22 ENCOUNTER — Ambulatory Visit: Payer: 59 | Admitting: Occupational Therapy

## 2021-05-23 ENCOUNTER — Other Ambulatory Visit: Payer: Self-pay

## 2021-05-23 ENCOUNTER — Ambulatory Visit (INDEPENDENT_AMBULATORY_CARE_PROVIDER_SITE_OTHER): Payer: 59 | Admitting: Orthopaedic Surgery

## 2021-05-23 ENCOUNTER — Encounter: Payer: Self-pay | Admitting: Orthopaedic Surgery

## 2021-05-23 DIAGNOSIS — S62357A Nondisplaced fracture of shaft of fifth metacarpal bone, left hand, initial encounter for closed fracture: Secondary | ICD-10-CM

## 2021-05-23 NOTE — Progress Notes (Signed)
Office Visit Note   Patient: Cassie Mata           Date of Birth: 1966-07-23           MRN: [id number] Visit Date: 05/23/2021              Requested by: No referring provider defined for this encounter. PCP: Pcp, No   Assessment & Plan: Visit Diagnoses:  1. Closed nondisplaced fracture of shaft of fifth metacarpal bone of left hand, initial encounter     Plan: Ms. Mutch is nearly 2 months status post left little metacarpal fracture near the base.  She is done well.  She is regained full range of motion.  She notes she is a little bit of soreness but having more trouble across the metacarpal phalangeal joints and in the palm of her hand particularly in the morning.  She is working.  In the past she has been told she has fibromyalgia.  She sustained an on-the-job injury over 10 years ago and developed carpal tunnel syndrome treated by physician in [facility].  She never had surgery.  She is never seen a rheumatologist.  I I think the metacarpal fracture is healed nicely.  There is very minimal tenderness and no deformity and full range of motion.  She is having some discomfort across the metacarpal phalangeal joints of her left hand and she only has a positive Tinel's.  I think it is worth having Dr.Deveshwar evaluate her for possible RA  Follow-Up Instructions: Return Will refer to Dr Estanislado Pandy for rheumatologic evaluation.   Orders:  Orders Placed This Encounter  Procedures   Ambulatory referral to Rheumatology   No orders of the defined types were placed in this encounter.     Procedures: No procedures performed   Clinical Data: No additional findings.   Subjective: Chief Complaint  Patient presents with   Left Hand - Follow-up    Left metacarpal fracture  Patient presents today for a 7 week follow up on her left hand. She has a fifth metacarpal hand fracture. She has been going to physical therapy. She states that she is still having difficulty and  pain.  HPI  Review of Systems   Objective: Vital Signs: LMP 09/06/2015 (Exact Date)   Physical Exam Constitutional:      Appearance: She is well-developed.  Eyes:     Pupils: Pupils are equal, round, and reactive to light.  Pulmonary:     Effort: Pulmonary effort is normal.  Skin:    General: Skin is warm and dry.  Neurological:     Mental Status: She is alert and oriented to person, place, and time.  Psychiatric:        Behavior: Behavior normal.    Ortho Exam left hand was not swollen.  There is minimal tenderness along the base of the fifth met at carpal where she did have the fracture.  There is no deformity or hypertrophy or palpable bony prominence.  Full range of motion of her fingers.  Slow but is swelling along the index metacarpal phalangeal joint.  She has a history of possible RA.  Also has positive Tinel's over the median nerve and has been told in the past that she had carpal tunnel syndrome treated by an orthopedist in [facility]  Specialty Comments:  No specialty comments available.  Imaging: No results found.   PMFS History: Patient Active Problem List   Diagnosis Date Noted   Constipation    Vomiting in  adult 03/22/2021   AKI (acute kidney injury) ([place]) 03/22/2021   Metacarpal bone fracture 03/13/2021   Diabetes mellitus type II, uncontrolled ([place]) 08/08/2015   Essential hypertension 08/08/2015   GERD (gastroesophageal reflux disease) 08/08/2015   Chronic low back pain with sciatica 08/08/2015   Arthritis 08/08/2015   Depression 06/15/2015   Insomnia 08/07/2014   Myalgia and myositis 01/13/2013   Past Medical History:  Diagnosis Date   Arthritis    Asthma    Cataract    NS OU   CHF (congestive heart failure) (HCC)    Coma ([place])    Depression    Diabetes mellitus    Diabetic retinopathy ([place])    OU   Fibromyalgia    Gall stone    Gastroesophageal reflux disease    Hiatal hernia    Insomnia 08/07/2014   Myalgia and myositis,  unspecified 01/13/2013   Stroke ([place]) 2007   Pt. evaluated at [facility] had left sided weakness.  Reportedly no cerebral imaging ever done.  Records we have received from [facility] dated 2011 do not document this.  Rest of records in storage and have not yet sent.    Family History  Problem Relation Age of Onset   Diabetes Sister    Alcohol abuse Sister    Cirrhosis Sister    Mental illness Son        suicidal thoughts   ADD / ADHD Son    Diabetes Father    Cancer Father        Brain   Cirrhosis Mother        alcoholic   Mental illness Brother    Arthritis Sister    Cancer Sister        ?possible gastric cancer   ADD / ADHD Son    ADD / ADHD Son    Mental illness Son        schizophrenia   Blindness Son        Legal blindness    Past Surgical History:  Procedure Laterality Date   CESAREAN SECTION  1987,1988,1996,2006   Social History   Occupational History   Occupation: Forensic psychologist: [employer]  Tobacco Use   Smoking status: Former    Years: 17.00    Types: Cigarettes    Quit date: 08/03/1995    Years since quitting: 25.8   Smokeless tobacco: Never  Substance and Sexual Activity   Alcohol use: No   Drug use: No   Sexual activity: Not on file

## 2021-05-29 ENCOUNTER — Ambulatory Visit: Payer: 59 | Attending: Orthopaedic Surgery | Admitting: Occupational Therapy

## 2021-05-29 ENCOUNTER — Other Ambulatory Visit: Payer: Self-pay
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
## 2021-06-04 ENCOUNTER — Other Ambulatory Visit: Payer: Self-pay

## 2021-06-04 ENCOUNTER — Ambulatory Visit (INDEPENDENT_AMBULATORY_CARE_PROVIDER_SITE_OTHER): Payer: 59 | Admitting: Internal Medicine

## 2021-06-04 ENCOUNTER — Encounter: Payer: Self-pay | Admitting: Internal Medicine

## 2021-06-04 VITALS — BP 155/84 | HR 93 | Resp 14 | Ht 60.0 in | Wt 140.0 lb

## 2021-06-04 DIAGNOSIS — M1990 Unspecified osteoarthritis, unspecified site: Secondary | ICD-10-CM

## 2021-06-04 DIAGNOSIS — M889 Osteitis deformans of unspecified bone: Secondary | ICD-10-CM

## 2021-06-04 DIAGNOSIS — M5442 Lumbago with sciatica, left side: Secondary | ICD-10-CM | POA: Diagnosis not present

## 2021-06-04 DIAGNOSIS — M79642 Pain in left hand: Secondary | ICD-10-CM | POA: Diagnosis not present

## 2021-06-04 DIAGNOSIS — G8929 Other chronic pain: Secondary | ICD-10-CM

## 2021-06-05 ENCOUNTER — Ambulatory Visit: Payer: 59 | Admitting: Occupational Therapy

## 2021-06-05 LAB — RHEUMATOID FACTOR: Rheumatoid fact SerPl-aCnc: <14 IU/mL (ref <14)

## 2021-06-05 LAB — C-REACTIVE PROTEIN: CRP: <0.2 mg/L (ref <8.0)

## 2021-06-05 LAB — CYCLIC CITRUL PEPTIDE ANTIBODY, IGG: Cyclic Citrullin Peptide Ab: <16 UNITS

## 2021-06-05 LAB — ALKALINE PHOSPHATASE: Alkaline phosphatase (APISO): 108 U/L (ref 37–153)

## 2021-06-05 LAB — SEDIMENTATION RATE: Sed Rate: 33 mm/h — ABNORMAL HIGH (ref 0–30)

## 2021-06-13 ENCOUNTER — Encounter: Payer: Self-pay | Admitting: Occupational Therapy

## 2021-06-13 DIAGNOSIS — M25642 Stiffness of left hand, not elsewhere classified: Secondary | ICD-10-CM

## 2021-06-13 DIAGNOSIS — M79642 Pain in left hand: Secondary | ICD-10-CM

## 2021-06-13 NOTE — Therapy (Signed)
[facility] [address] [place], [place], [address] Phone: [phone]   Fax:  [phone]  June 13, 2021    No Recipients  Occupational Therapy Discharge Summary   Patient: Cassie Mata MRN: [id number] Date of Birth: 06/25/1966  Diagnosis: Pain in left hand  Stiffness of left hand, not elsewhere classified  Referring Provider (OT): Dr Durward Fortes   The above patient had been seen in Occupational Therapy 2 times and has cancelled/no-showed last several visits.  Patient has not been seen in over a month,.   The treatment consisted of eval and one treatment The patient is: Unchanged  Subjective: Patient has not returned for follow up therapy  Discharge Findings: NA  Functional Status at Discharge:  Unable to comment  No Goals Met    Sincerely,  Alyson Ki, Algernon Huxley, OT/L  CC No Recipients  [facility] [address] [place], [place], [address] Phone: [phone]   Fax:  [phone]  Patient: Cassie Mata MRN: [id number] Date of Birth: 06/23/1966
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
## 2021-06-24 ENCOUNTER — Other Ambulatory Visit: Payer: Self-pay

## 2021-06-24 ENCOUNTER — Encounter: Payer: Self-pay | Admitting: Internal Medicine

## 2021-06-24 ENCOUNTER — Ambulatory Visit (INDEPENDENT_AMBULATORY_CARE_PROVIDER_SITE_OTHER): Payer: 59 | Admitting: Internal Medicine

## 2021-06-24 VITALS — BP 162/90 | HR 98 | Resp 15 | Ht 60.0 in | Wt 146.0 lb

## 2021-06-24 DIAGNOSIS — M19042 Primary osteoarthritis, left hand: Secondary | ICD-10-CM

## 2021-06-24 DIAGNOSIS — M889 Osteitis deformans of unspecified bone: Secondary | ICD-10-CM

## 2021-06-24 DIAGNOSIS — M19049 Primary osteoarthritis, unspecified hand: Secondary | ICD-10-CM | POA: Insufficient documentation

## 2021-06-24 NOTE — Patient Instructions (Signed)
For osteoarthritis of the hand several treatments may be beneficial:  - Topical antiinflammatory medicine such as diclofenac or Voltaren can be applied to  affected area as needed but may be less effective than oral antiinflammatory medicine. Topical analgesics containing CBD, menthol, or lidocaine can be tried. Capsaicin containing treatments are recommended against for the hand.  - Oral nonsteroidal antiinflammatory medication such as ibuprofen, aleve, celebrex, or mobic are very helpful for osteoarthritis but can cause side effects such as stomach ulcers or hypertension with prolonged use. These should be taken intermittently or as needed, and always taken with food.  - Other oral supplements such as glucosamine chondroitin containing OTC treatments such as osteo bi-flex or other brands do not have strong data supporting effectiveness but can be helpful for some individuals and have no major side effects. Turmeric has some antiinflammatory effect similar to NSAID medications and may help, if taken as a supplement should not be taken above recommended doses.  - Compressive gloves can be helpful to support the thumb joint especially if hurting with certain activities.  - Occupational therapy referral can discuss exercises or activity modification to improve symptoms or strength if needed.  - Local steroid injection is an option if symptoms become worse and not controlled by the above options.  

## 2021-07-11 ENCOUNTER — Encounter: Payer: 59 | Admitting: Internal Medicine

## 2021-08-27 ENCOUNTER — Other Ambulatory Visit: Payer: Self-pay | Admitting: Internal Medicine

## 2021-08-27 MED ORDER — INSULIN DEGLUDEC 100 UNIT/ML ~~LOC~~ SOPN: 12.0000 [IU] | PEN_INJECTOR | Freq: Every day | SUBCUTANEOUS | 0 refills | Status: DC

## 2021-08-28 ENCOUNTER — Telehealth: Payer: Self-pay

## 2021-08-28 NOTE — Telephone Encounter (Signed)
PA came through cover my meds  for pt ( TRESIBA FLEXT OUCH 100UNITS/ML)   was  done .Marland Kitchen   DECISION :   Cassie Mata (Key: [id number]) Cassie Mata FlexTouch (insulin degludec injection) 100 Units/mL solution   Form Teaching laboratory technician Prior Authorization Form (2017 NCPDP) Created 5 minutes ago Sent to Plan 1 minute ago Plan Response less than a minute ago Submit Clinical Questions Determination Message from [facility] Prior Authorization Department is unable to review this request because no prior authorization is needed for this member/medication at this time. Please reach out to the following number for further assistance: [phone]. Thank you in advance.    ( COPY WAS SENT TO PHARMACY ALSO LONG WITH INSURANCE CARD THAT WAS USED )

## 2021-09-05 NOTE — Telephone Encounter (Signed)
Call fom CoverMyMeds about denial for [name].  Call to [facility].. Prescription for Cassie Mata is covered.  Denied effective until 09/02/2021 was requested on 12/22/.  Call to [name] was run and approved for patient.  They will call patient when prescription is ready.

## 2021-10-03 ENCOUNTER — Encounter: Payer: 59 | Admitting: Student

## 2021-12-09 ENCOUNTER — Other Ambulatory Visit: Payer: Self-pay | Admitting: Student

## 2021-12-29 ENCOUNTER — Other Ambulatory Visit: Payer: Self-pay | Admitting: Internal Medicine

## 2022-02-06 ENCOUNTER — Encounter: Payer: Medicaid Other | Admitting: Student

## 2022-03-12 DIAGNOSIS — E669 Obesity, unspecified: Secondary | ICD-10-CM | POA: Diagnosis not present

## 2022-03-12 DIAGNOSIS — I2510 Atherosclerotic heart disease of native coronary artery without angina pectoris: Secondary | ICD-10-CM | POA: Diagnosis not present

## 2022-03-12 DIAGNOSIS — G4733 Obstructive sleep apnea (adult) (pediatric): Secondary | ICD-10-CM | POA: Diagnosis not present

## 2022-03-12 DIAGNOSIS — I10 Essential (primary) hypertension: Secondary | ICD-10-CM | POA: Diagnosis not present

## 2022-03-12 DIAGNOSIS — Z794 Long term (current) use of insulin: Secondary | ICD-10-CM | POA: Diagnosis not present

## 2022-03-12 DIAGNOSIS — E113513 Type 2 diabetes mellitus with proliferative diabetic retinopathy with macular edema, bilateral: Secondary | ICD-10-CM | POA: Diagnosis not present

## 2022-03-12 DIAGNOSIS — H4311 Vitreous hemorrhage, right eye: Secondary | ICD-10-CM | POA: Diagnosis not present

## 2022-03-12 DIAGNOSIS — E785 Hyperlipidemia, unspecified: Secondary | ICD-10-CM | POA: Diagnosis not present

## 2022-03-12 DIAGNOSIS — E1136 Type 2 diabetes mellitus with diabetic cataract: Secondary | ICD-10-CM | POA: Diagnosis not present

## 2022-03-17 DIAGNOSIS — H25811 Combined forms of age-related cataract, right eye: Secondary | ICD-10-CM | POA: Diagnosis not present

## 2022-03-17 DIAGNOSIS — E113511 Type 2 diabetes mellitus with proliferative diabetic retinopathy with macular edema, right eye: Secondary | ICD-10-CM | POA: Diagnosis not present

## 2022-03-17 DIAGNOSIS — E113592 Type 2 diabetes mellitus with proliferative diabetic retinopathy without macular edema, left eye: Secondary | ICD-10-CM | POA: Diagnosis not present

## 2022-03-17 DIAGNOSIS — H4311 Vitreous hemorrhage, right eye: Secondary | ICD-10-CM | POA: Diagnosis not present

## 2022-03-21 ENCOUNTER — Ambulatory Visit (INDEPENDENT_AMBULATORY_CARE_PROVIDER_SITE_OTHER): Payer: 59

## 2022-03-21 ENCOUNTER — Ambulatory Visit: Payer: 59 | Admitting: Podiatry

## 2022-03-21 ENCOUNTER — Encounter: Payer: Self-pay | Admitting: Podiatry

## 2022-03-21 DIAGNOSIS — M7752 Other enthesopathy of left foot: Secondary | ICD-10-CM

## 2022-03-21 DIAGNOSIS — M779 Enthesopathy, unspecified: Secondary | ICD-10-CM

## 2022-03-21 MED ORDER — TRIAMCINOLONE ACETONIDE 10 MG/ML IJ SUSP
10.0000 mg | Freq: Once | INTRAMUSCULAR | Status: AC
  Administered 2022-03-21: 10 mg

## 2022-03-23 NOTE — Progress Notes (Signed)
Subjective:   Patient ID: Cassie Mata, female   DOB: 56 y.o.   MRN: [id number]   HPI Patient states that she has developed a lot of pain in her left ankle over her right foot over the last couple months.  Patient states that the ankles been very sore and she does not remember injury and patient does not smoke likes to be active   Review of Systems  All other systems reviewed and are negative.       Objective:  Physical Exam Vitals and nursing note reviewed.  Constitutional:      Appearance: She is well-developed.  Pulmonary:     Effort: Pulmonary effort is normal.  Musculoskeletal:        General: Normal range of motion.  Skin:    General: Skin is warm.  Neurological:     Mental Status: She is alert.     Neurovascular status intact muscle strength was found to be adequate range of motion adequate with some reduction of motion of the subtalar joint bilateral.  Patient is noted to have inflammation and pain of the sinus tarsi left with fluid buildup moderate swelling negative Homans' sign noted and mild lateral foot pain right.  Good digital perfusion well oriented x3     Assessment:  Sinus tarsitis left with inflammation fluid buildup with mild peroneal tendinitis right      Plan:  H&P conditions reviewed and for the left I went ahead I did sterile prep I injected the sinus tarsi 3 mg Kenalog 5 mg Xylocaine and I went ahead and I discussed ice therapy for the right.  Patient will be seen back as symptoms indicate  X-rays indicate that there is possibly some low-grade arthritis subtalar joint left over right moderate depression of the arch no other pathology noted

## 2022-03-27 DIAGNOSIS — Z111 Encounter for screening for respiratory tuberculosis: Secondary | ICD-10-CM | POA: Diagnosis not present

## 2022-04-09 DIAGNOSIS — I5022 Chronic systolic (congestive) heart failure: Secondary | ICD-10-CM | POA: Diagnosis not present

## 2022-04-09 DIAGNOSIS — I639 Cerebral infarction, unspecified: Secondary | ICD-10-CM | POA: Diagnosis not present

## 2022-04-09 DIAGNOSIS — E782 Mixed hyperlipidemia: Secondary | ICD-10-CM | POA: Diagnosis not present

## 2022-04-09 DIAGNOSIS — Z794 Long term (current) use of insulin: Secondary | ICD-10-CM | POA: Diagnosis not present

## 2022-04-09 DIAGNOSIS — E1165 Type 2 diabetes mellitus with hyperglycemia: Secondary | ICD-10-CM | POA: Diagnosis not present

## 2022-04-09 DIAGNOSIS — R0789 Other chest pain: Secondary | ICD-10-CM | POA: Diagnosis not present

## 2022-04-09 DIAGNOSIS — I10 Essential (primary) hypertension: Secondary | ICD-10-CM | POA: Diagnosis not present

## 2022-04-09 DIAGNOSIS — I110 Hypertensive heart disease with heart failure: Secondary | ICD-10-CM | POA: Diagnosis not present

## 2022-04-09 DIAGNOSIS — Z91041 Radiographic dye allergy status: Secondary | ICD-10-CM | POA: Diagnosis not present

## 2022-04-09 DIAGNOSIS — Z6834 Body mass index (BMI) 34.0-34.9, adult: Secondary | ICD-10-CM | POA: Diagnosis not present

## 2022-04-09 DIAGNOSIS — R69 Illness, unspecified: Secondary | ICD-10-CM | POA: Diagnosis not present

## 2022-04-09 DIAGNOSIS — K219 Gastro-esophageal reflux disease without esophagitis: Secondary | ICD-10-CM | POA: Diagnosis not present

## 2022-04-09 DIAGNOSIS — E6609 Other obesity due to excess calories: Secondary | ICD-10-CM | POA: Diagnosis not present

## 2022-04-09 DIAGNOSIS — G4733 Obstructive sleep apnea (adult) (pediatric): Secondary | ICD-10-CM | POA: Diagnosis not present

## 2022-04-09 DIAGNOSIS — E113513 Type 2 diabetes mellitus with proliferative diabetic retinopathy with macular edema, bilateral: Secondary | ICD-10-CM | POA: Diagnosis not present

## 2022-04-10 DIAGNOSIS — I10 Essential (primary) hypertension: Secondary | ICD-10-CM | POA: Diagnosis not present

## 2022-04-10 DIAGNOSIS — E113513 Type 2 diabetes mellitus with proliferative diabetic retinopathy with macular edema, bilateral: Secondary | ICD-10-CM | POA: Diagnosis not present

## 2022-04-22 NOTE — Progress Notes (Unsigned)
NEW PATIENT Date of Service/Encounter:  04/24/22 Referring provider: none-self referred Primary care provider: Eather Colas, FNP  Subjective:  Cassie Mata is a 56 y.o. female with a PMHx of OSA, GERD, DMII, depression, history of CVA, vitamin D deficiency, HTN, AKI presenting today for evaluation of allergic rhinitis. History obtained from: chart review and patient.   Chronic rhinitis:  Symptoms include:  coughing, runny nose, watery/itchy eyes; recently had cataract and retinal surgery   Occurs year-round with seasonal flares-Fall, Spring Potential triggers: outdoor pollens Treatments tried: she has been on nasal sprays and zyrtec in the past.  She wanted to do allergy injections.  She is not currently taking anything. Previous allergy testing: yes-years ago, doesn't remember results. History of reflux/heartburn: no; doesn't take anything but does feel nausea sometimes and has suspected this may be related to some of her medications. She also wonders about food allergies, but no specific triggers/concerns.  She does take metoprolol and losartan for high blood pressure.   Previous patient who was going to do allergy injections but then stopped coming to our practice.  She was previously evaluated by Dr. Beaulah Dinning  Asthma-diagnosed as an adult.  Prescribed advair 250-50 1 puff BID, but using as needed.  Never hospitalized due to asthma. Symptoms include cough and shortness of breath. Out of rescue inhaler but feels she would use it every day here recently.     Other allergy screening: Medication intolerances-multiple medication intolerances listed in allergy list ,not reviewed with patient Eczema:no   Past Medical History: Past Medical History:  Diagnosis Date   Arthritis    Asthma    Cataract    NS OU   CHF (congestive heart failure) (HCC)    Coma (HCC)    Depression    Diabetes mellitus    Diabetic retinopathy (HCC)    OU   Fibromyalgia    Gall stone     Gastroesophageal reflux disease    Hiatal hernia    Insomnia 08/07/2014   Myalgia and myositis, unspecified 01/13/2013   Stroke (HCC) 2007   Pt. evaluated at [facility] had left sided weakness.  Reportedly no cerebral imaging ever done.  Records we have received from [facility] dated 2011 do not document this.  Rest of records in storage and have not yet sent.   Medication List:  Current Outpatient Medications  Medication Sig Dispense Refill   AgaMatrix Ultra-Thin Lancets MISC Check sugars twice daily before meals 100 each 0   albuterol (VENTOLIN HFA) 108 (90 Base) MCG/ACT inhaler Inhale 1-2 puffs into the lungs every 6 (six) hours as needed for wheezing. 1 each 2   aspirin 81 MG tablet Take 81 mg by mouth daily.      atorvastatin (LIPITOR) 80 MG tablet Take 80 mg by mouth daily.     azelastine (ASTELIN) 0.1 % nasal spray Place 2 sprays into both nostrils 2 (two) times daily as needed for rhinitis. Use in each nostril as directed 30 mL 12   Blood Glucose Monitoring Suppl (AGAMATRIX PRESTO) w/Device KIT Check blood sugars twice daily before meals 1 kit 0   cholecalciferol (VITAMIN D) 1000 UNITS tablet Take 2,000 Units by mouth at bedtime.      cyclobenzaprine (FLEXERIL) 10 MG tablet Take 1 tablet (10 mg total) by mouth 2 (two) times daily as needed for muscle spasms. 20 tablet 0   diphenhydrAMINE (BENADRYL) 25 mg capsule Take 25 mg by mouth every 6 (six) hours as needed for allergies.  fluticasone (FLONASE) 50 MCG/ACT nasal spray Place 2 sprays into both nostrils daily. 16 g 2   fluticasone-salmeterol (ADVAIR) 250-50 MCG/ACT AEPB Inhale 1 puff into the lungs in the morning and at bedtime. 60 each 3   glucose blood (AGAMATRIX PRESTO TEST) test strip Check sugars twice daily before meals 100 each 12   HYDROcodone-acetaminophen (NORCO/VICODIN) 5-325 MG tablet Take 1 tablet by mouth every 12 (twelve) hours as needed for moderate pain. 30 tablet 0   ibuprofen (ADVIL) 200 MG  tablet Take 200 mg by mouth daily as needed for headache or mild pain.     loratadine (CLARITIN) 10 MG tablet Take 1 tablet (10 mg total) by mouth daily. 30 tablet 5   losartan (COZAAR) 25 MG tablet TAKE 2 TABLETS BY MOUTH EVERY DAY 30 tablet 0   metoprolol tartrate (LOPRESSOR) 25 MG tablet TAKE 1/2 (ONE-HALF) TABLET BY MOUTH TWICE DAILY 30 tablet 0   Multiple Vitamin (MULTIVITAMIN) tablet Take 1 tablet by mouth daily.     TECHLITE PEN NEEDLES 32G X 4 MM MISC USE TO ADMINISTER INSULIN ONCE DAILY 100 each 2   TRESIBA FLEXTOUCH 100 UNIT/ML FlexTouch Pen INJECT TWELVE UNITS UNDER THE SKIN DAILY 3 mL 6   furosemide (LASIX) 40 MG tablet Take 1 tablet (40 mg total) by mouth daily for 5 days. 5 tablet 0   insulin aspart (FIASP) 100 UNIT/ML FlexTouch Pen Inject 10 Units into the skin with breakfast, with lunch, and with evening meal. (Patient not taking: Reported on 06/04/2021) 3 mL 0   Insulin Syringes, Disposable, U-100 0.3 ML MISC 1 Syringe by Does not apply route as needed. (Patient not taking: Reported on 06/04/2021) 100 each 10   isosorbide mononitrate (IMDUR) 30 MG 24 hr tablet Take 30 mg by mouth daily. (Patient not taking: Reported on 06/04/2021)     lidocaine (LIDODERM) 5 % Place 1 patch onto the skin daily. Remove & Discard patch within 12 hours or as directed by MD (Patient not taking: Reported on 06/04/2021) 6 patch 0   nitroGLYCERIN (NITROSTAT) 0.4 MG SL tablet Place 1 tablet (0.4 mg total) under the tongue every 5 (five) minutes as needed for chest pain. 30 tablet 0   pantoprazole (PROTONIX) 40 MG tablet Take 1 tablet (40 mg total) by mouth daily. Reported on 09/13/2015 (Patient not taking: Reported on 06/04/2021) 90 tablet 1   No current facility-administered medications for this visit.   Known Allergies:  Allergies  Allergen Reactions   Eszopiclone     Other reaction(s): Other (See Comments) Generic for lunesta-caused stroke like symptoms    Gabapentin Other (See Comments)    Other  reaction(s): GI Upset (intolerance)   Ivp Dye [Iodinated Contrast Media] Nausea Only   Other     "allergy medications"= makes heart flutter   Prednisone Other (See Comments)    Elevates blood sugar   Ultracet [Tramadol-Acetaminophen] Nausea And Vomiting   Past Surgical History: Past Surgical History:  Procedure Laterality Date   CESAREAN SECTION  1987,1988,1996,2006   INNER EAR SURGERY     TUBAL LIGATION     Family History: Family History  Problem Relation Age of Onset   Cirrhosis Mother        alcoholic   Diabetes Father    Cancer Father        Brain   Diabetes Sister    Alcohol abuse Sister    Cirrhosis Sister    Arthritis Sister    Cancer Sister        ?  possible gastric cancer   Mental illness Son        suicidal thoughts   ADD / ADHD Son    ADD / ADHD Son    ADD / ADHD Son    Mental illness Son        schizophrenia   Blindness Son        Legal blindness   Social History: Reva lives in a house without water damage, wood floors, gas heating, central AC, no pets, no cockroaches, using dust mite protection on the bedding but not pillows, no smoke exposure, she is a Automotive engineer x9 years, no HEPA filter in the home.  [place]  ROS:  All other systems negative except as noted per HPI.  Objective:  Blood pressure (!) 166/90, pulse 90, temperature (!) 97.5 F (36.4 C), temperature source Temporal, resp. rate 18, height 5' 2.5" (1.588 m), weight 157 lb (71.2 kg), last menstrual period 09/06/2015, SpO2 99 %. Body mass index is 28.26 kg/m. Physical Exam:  General Appearance:  Alert, cooperative, no distress, appears stated age  Head:  Normocephalic, without obvious abnormality, atraumatic  Eyes:  Conjunctiva clear, EOM's intact  Nose: Nares normal, hypertrophic turbinates, normal mucosa, and no visible anterior polyps  Throat: Lips, tongue normal; teeth and gums normal, normal posterior oropharynx  Neck: Supple,  symmetrical  Lungs:   clear to auscultation bilaterally, Respirations unlabored, no coughing  Heart:  regular rate and rhythm and no murmur, Appears well perfused  Extremities: No edema  Skin: Skin color, texture, turgor normal, no rashes or lesions on visualized portions of skin  Neurologic: No gross deficits     Diagnostics: Spirometry:  Tracings reviewed. Her effort: Good reproducible efforts. FVC: 2.70L  FEV1: 2.17L, 105% predicted  FEV1/FVC ratio: 100%  Interpretation: Spirometry consistent with normal pattern  Skin Testing: Environmental allergy panel and select foods.  Adequate controls. Results discussed with patient/family.  Airborne Adult Perc - 04/24/22 1458     Time Antigen Placed 1458    Allergen Manufacturer Lavella Hammock    Location Back    Number of Test 59    Panel 1 Select    1. Control-Buffer 50% Glycerol Negative    2. Control-Histamine 1 mg/ml 3+    3. Albumin saline Negative    4. [place] Negative    5. [place] Negative    6. [place] Negative    7. [place] Blue Negative    8. Meadow Fescue 3+    9. Perennial Rye Negative    10. Sweet Vernal Negative    11. Timothy Negative    12. Cocklebur Negative    13. Burweed Marshelder Negative    14. Ragweed, short Negative    15. Ragweed, Giant Negative    16. Plantain,  English Negative    17. Lamb's Quarters Negative    18. Sheep Sorrell Negative    19. Rough Pigweed Negative    20. Marsh Elder, Rough Negative    21. Mugwort, Common Negative    22. Ash mix 3+    23. Birch mix Negative    24. Beech American Negative    25. Box, Elder Negative    26. Cedar, red Negative    27. Cottonwood, [place] Negative    28. Elm mix Negative    29. Hickory Negative    30. Maple mix Negative    31. Oak, [place] mix Negative    32. Pecan Pollen Negative    33. Pine mix Negative  [place]olt Negative    35. [place], Black Pollen Negative    36. Alternaria alternata Negative    37. Cladosporium Herbarum  Negative    38. Aspergillus mix Negative    39. Penicillium mix Negative    40. Bipolaris sorokiniana (Helminthosporium) Negative    41. Drechslera spicifera (Curvularia) Negative    42. Mucor plumbeus Negative    43. Fusarium moniliforme Negative    44. Aureobasidium pullulans (pullulara) Negative    45. Rhizopus oryzae Negative    46. Botrytis cinera Negative    47. Epicoccum nigrum Negative    48. Phoma betae Negative    49. Candida Albicans Negative    50. Trichophyton mentagrophytes Negative    51. Mite, D Farinae  5,000 AU/ml 4+    52. Mite, D Pteronyssinus  5,000 AU/ml 4+    53. Cat Hair 10,000 BAU/ml 2+    54.  Dog Epithelia 3+    55. Mixed Feathers Negative    56. Horse Epithelia Negative    57. Cockroach, German Negative    58. Mouse Negative    59. Tobacco Leaf Negative             Food Perc - 04/24/22 1459       Test Information   Time Antigen Placed 1459    Allergen Manufacturer Lavella Hammock    Location Back    Number of allergen test 10      Food   1. Peanut Negative    2. Soybean food Negative    3. Wheat, whole Negative    4. Sesame Negative    5. Milk, cow Negative    6. Egg White, chicken Negative    7. Casein Negative    8. Shellfish mix Negative    9. Fish mix Negative    10. Cashew Negative             Allergy testing results were read and interpreted by myself, documented by clinical staff.  Assessment and Plan  Moderate Persistent Asthma: uncontrolled - your lung testing today looked great, but clinically not controlled - Controller Inhaler: Start Advair 250-50;  1 puff twice a day; This Should Be Used Everyday - Rinse mouth out after use - Rescue Inhaler: Albuterol (Proair/Ventolin) 2 puffs . Use  every 4-6 hours as needed for chest tightness, wheezing, or coughing.  Can also use 15 minutes prior to exercise if you have symptoms with activity. - Asthma is not controlled if:  - Symptoms are occurring >2 times a week OR  - >2 times a  month nighttime awakenings  - You are requiring systemic steroids (prednisone/steroid injections) more than once per year  - Your require hospitalization for your asthma.  - Please call the clinic to schedule a follow up if these symptoms arise  Chronic Rhinitis -seasonal and perennial allergic: - allergy testing today was positive to dust mites, cat, dog, grass pollen (fescue) and tree pollen (ash) - allergen avoidance as below - consider allergy shots as long term control of your symptoms by teaching your immune system to be more tolerant of your allergy triggers - Start Nasal Steroid Spray: Options include Flonase (fluticasone), Nasocort (triamcinolone), Nasonex (mometasome) 1- 2 sprays in each nostril daily (can buy over-the-counter if not covered by insurance)  Best results if used daily. - Start Astelin 1-2 sprays up to twice daily for nasal congestion/sneezing - Start Claritin (Loratadine) 10mg  daily  Allergic Conjunctivitis:  - use eye drops as prescribed by ophthalmologist.  Food allergy testing was negative to most common food allergens.   Follow-up in 3 months, sooner if needed.  Plan for intradermals at that visit. It was a pleasure meeting you today Sigurd Sos, MD [facility]  This note in its entirety was forwarded to the Provider who requested this consultation.  Thank you for your kind referral. I appreciate the opportunity to take part in Sanjuanita's care. Please do not hesitate to contact me with questions.  Sincerely,  Sigurd Sos, MD [facility]

## 2022-04-24 ENCOUNTER — Ambulatory Visit (INDEPENDENT_AMBULATORY_CARE_PROVIDER_SITE_OTHER): Payer: 59 | Admitting: Internal Medicine

## 2022-04-24 ENCOUNTER — Encounter: Payer: Self-pay | Admitting: Internal Medicine

## 2022-04-24 VITALS — BP 166/90 | HR 90 | Temp 97.5°F | Resp 18 | Ht 62.5 in | Wt 157.0 lb

## 2022-04-24 DIAGNOSIS — J302 Other seasonal allergic rhinitis: Secondary | ICD-10-CM

## 2022-04-24 DIAGNOSIS — J4540 Moderate persistent asthma, uncomplicated: Secondary | ICD-10-CM

## 2022-04-24 DIAGNOSIS — J3089 Other allergic rhinitis: Secondary | ICD-10-CM

## 2022-04-24 DIAGNOSIS — J310 Chronic rhinitis: Secondary | ICD-10-CM

## 2022-04-24 DIAGNOSIS — H1013 Acute atopic conjunctivitis, bilateral: Secondary | ICD-10-CM

## 2022-04-24 DIAGNOSIS — K9049 Malabsorption due to intolerance, not elsewhere classified: Secondary | ICD-10-CM | POA: Diagnosis not present

## 2022-04-24 MED ORDER — LORATADINE 10 MG PO TABS: 10.0000 mg | ORAL_TABLET | Freq: Every day | ORAL | 5 refills | Status: DC

## 2022-04-24 MED ORDER — ALBUTEROL SULFATE HFA 108 (90 BASE) MCG/ACT IN AERS: 1.0000 | INHALATION_SPRAY | Freq: Four times a day (QID) | RESPIRATORY_TRACT | 2 refills | Status: DC | PRN

## 2022-04-24 MED ORDER — FLUTICASONE PROPIONATE 50 MCG/ACT NA SUSP: 2.0000 | Freq: Every day | NASAL | 2 refills | Status: AC

## 2022-04-24 MED ORDER — AZELASTINE HCL 0.1 % NA SOLN
2.0000 | Freq: Two times a day (BID) | NASAL | 12 refills | Status: DC | PRN
Start: 2022-04-24 — End: 2023-11-16

## 2022-04-24 MED ORDER — FLUTICASONE-SALMETEROL 250-50 MCG/ACT IN AEPB
1.0000 | INHALATION_SPRAY | Freq: Two times a day (BID) | RESPIRATORY_TRACT | 3 refills | Status: DC
Start: 2022-04-24 — End: 2023-11-16

## 2022-04-24 NOTE — Patient Instructions (Addendum)
Moderate Persistent Asthma: - your lung testing today looked great - Controller Inhaler: Start Advair 250-50  1 puff twice a day; This Should Be Used Everyday - Rinse mouth out after use - Rescue Inhaler: Albuterol (Proair/Ventolin) 2 puffs . Use  every 4-6 hours as needed for chest tightness, wheezing, or coughing.  Can also use 15 minutes prior to exercise if you have symptoms with activity. - Asthma is not controlled if:  - Symptoms are occurring >2 times a week OR  - >2 times a month nighttime awakenings  - You are requiring systemic steroids (prednisone/steroid injections) more than once per year  - Your require hospitalization for your asthma.  - Please call the clinic to schedule a follow up if these symptoms arise  Chronic Rhinitis -seasonal and perennial allergic: - allergy testing today was positive to dust mites, cat, dog, grass pollen (fescue) and tree pollen (ash) - allergen avoidance as below - consider allergy shots as long term control of your symptoms by teaching your immune system to be more tolerant of your allergy triggers - Start Nasal Steroid Spray: Options include Flonase (fluticasone), Nasocort (triamcinolone), Nasonex (mometasome) 1- 2 sprays in each nostril daily (can buy over-the-counter if not covered by insurance)  Best results if used daily. - Start Astelin 1-2 sprays up to twice daily for nasal congestion/sneezing - Start Claritin (Loratadine) 10mg  daily  Allergic Conjunctivitis:  - use eye drops as prescribed by ophthalmologist.  Food allergy testing was negative to most common food allergens.   Follow-up in 3 months, sooner if needed.  Plan for intradermals at that visit. It was a pleasure meeting you today , MD [facility]  DUST MITE AVOIDANCE MEASURES:  There are three main measures that need and can be taken to avoid house dust mites:  Reduce accumulation of dust in general -reduce furniture, clothing, carpeting,  books, stuffed animals, especially in bedroom  Separate yourself from the dust -use pillow and mattress encasements (can be found at stores such as [employer] or online) -avoid direct exposure to air condition flow -use a HEPA filter device, especially in the bedroom; you can also use a HEPA filter vacuum cleaner -wipe dust with a moist towel instead of a dry towel or broom when cleaning  Decrease mites and/or their secretions -wash clothing and linen and stuffed animals at highest temperature possible, at least every 2 weeks -stuffed animals can also be placed in a bag and put in a freezer overnight  Despite the above measures, it is impossible to eliminate dust mites or their allergen completely from your home.  With the above measures the burden of mites in your home can be diminished, with the goal of minimizing your allergic symptoms.  Success will be reached only when implementing and using all means together.  Control of Dog or Cat Allergen  Avoidance is the best way to manage a dog or cat allergy. If you have a dog or cat and are allergic to dog or cats, consider removing the dog or cat from the home. If you have a dog or cat but don't want to find it a new home, or if your family wants a pet even though someone in the household is allergic, here are some strategies that may help keep symptoms at bay:  Keep the pet out of your bedroom and restrict it to only a few rooms. Be advised that keeping the dog or cat in only one room  will not limit the allergens to that room. Don't pet, hug or kiss the dog or cat; if you do, wash your hands with soap and water. High-efficiency particulate air (HEPA) cleaners run continuously in a bedroom or living room can reduce allergen levels over time. Regular use of a high-efficiency vacuum cleaner or a central vacuum can reduce allergen levels. Giving your dog or cat a bath at least once a week can reduce airborne allergen.  Reducing Pollen  Exposure  The American Academy of Allergy, Asthma and Immunology suggests the following steps to reduce your exposure to pollen during allergy seasons.    Do not hang sheets or clothing out to dry; pollen may collect on these items. Do not mow lawns or spend time around freshly cut grass; mowing stirs up pollen. Keep windows closed at night.  Keep car windows closed while driving. Minimize morning activities outdoors, a time when pollen counts are usually at their highest. Stay indoors as much as possible when pollen counts or humidity is high and on windy days when pollen tends to remain in the air longer. Use air conditioning when possible.  Many air conditioners have filters that trap the pollen spores. Use a HEPA room air filter to remove pollen form the indoor air you breathe.  t

## 2022-05-20 DIAGNOSIS — I340 Nonrheumatic mitral (valve) insufficiency: Secondary | ICD-10-CM | POA: Diagnosis not present

## 2022-06-25 DIAGNOSIS — E1136 Type 2 diabetes mellitus with diabetic cataract: Secondary | ICD-10-CM | POA: Diagnosis not present

## 2022-06-25 DIAGNOSIS — E113513 Type 2 diabetes mellitus with proliferative diabetic retinopathy with macular edema, bilateral: Secondary | ICD-10-CM | POA: Diagnosis not present

## 2022-06-25 DIAGNOSIS — Z9841 Cataract extraction status, right eye: Secondary | ICD-10-CM | POA: Diagnosis not present

## 2022-06-25 DIAGNOSIS — Z961 Presence of intraocular lens: Secondary | ICD-10-CM | POA: Diagnosis not present

## 2022-06-25 DIAGNOSIS — H4313 Vitreous hemorrhage, bilateral: Secondary | ICD-10-CM | POA: Diagnosis not present

## 2022-06-25 DIAGNOSIS — H26491 Other secondary cataract, right eye: Secondary | ICD-10-CM | POA: Diagnosis not present

## 2022-07-03 DIAGNOSIS — S5002XA Contusion of left elbow, initial encounter: Secondary | ICD-10-CM | POA: Diagnosis not present

## 2022-07-03 DIAGNOSIS — I129 Hypertensive chronic kidney disease with stage 1 through stage 4 chronic kidney disease, or unspecified chronic kidney disease: Secondary | ICD-10-CM | POA: Diagnosis not present

## 2022-07-03 DIAGNOSIS — N183 Chronic kidney disease, stage 3 unspecified: Secondary | ICD-10-CM | POA: Diagnosis not present

## 2022-07-03 DIAGNOSIS — E1122 Type 2 diabetes mellitus with diabetic chronic kidney disease: Secondary | ICD-10-CM | POA: Diagnosis not present

## 2022-07-03 DIAGNOSIS — I1310 Hypertensive heart and chronic kidney disease without heart failure, with stage 1 through stage 4 chronic kidney disease, or unspecified chronic kidney disease: Secondary | ICD-10-CM | POA: Diagnosis not present

## 2022-07-03 DIAGNOSIS — R801 Persistent proteinuria, unspecified: Secondary | ICD-10-CM | POA: Diagnosis not present

## 2022-07-03 DIAGNOSIS — Z794 Long term (current) use of insulin: Secondary | ICD-10-CM | POA: Diagnosis not present

## 2022-07-03 DIAGNOSIS — E559 Vitamin D deficiency, unspecified: Secondary | ICD-10-CM | POA: Diagnosis not present

## 2022-07-03 DIAGNOSIS — N1831 Chronic kidney disease, stage 3a: Secondary | ICD-10-CM | POA: Diagnosis not present

## 2022-07-03 DIAGNOSIS — M25532 Pain in left wrist: Secondary | ICD-10-CM | POA: Diagnosis not present

## 2022-07-17 ENCOUNTER — Other Ambulatory Visit: Payer: Self-pay | Admitting: Sports Medicine

## 2022-07-17 DIAGNOSIS — M25532 Pain in left wrist: Secondary | ICD-10-CM | POA: Diagnosis not present

## 2022-07-17 DIAGNOSIS — M25522 Pain in left elbow: Secondary | ICD-10-CM | POA: Diagnosis not present

## 2022-07-23 ENCOUNTER — Other Ambulatory Visit: Payer: Medicaid Other

## 2022-07-23 NOTE — Progress Notes (Deleted)
FOLLOW UP Date of Service/Encounter:  07/23/22   Subjective:  Cassie Mata (DOB: October 24, 1965) is a 56 y.o. female  PMHx of OSA, GERD, DMII, depression, history of CVA, vitamin D deficiency, HTN, AKI who returns to the [facility] on 07/25/2022 in re-evaluation of the following: Allergic rhinitis, asthma History obtained from: chart review and {Persons; PED relatives w/patient:19415::"patient"}.  For Review, LV was on 04/24/22  with Dr.Latayvia Mandujano seen for intial visit for allergic rhinitis and asthma .  We encouraged her to start her Advair 251 puff twice a day.  We also discussed allergy shots as well starting a nasal steroid spray, azelastine, Claritin.  She is using eyedrops prescribed by her ophthalmologist. She had nonspecific concerns regarding food allergy and her top 10 most common food allergens were negative on allergy testing  Pertinent History/Diagnostics:  Asthma: Diagnosed as an adult.  Never hospitalized.  Symptoms include cough and shortness of breath. - 2023-normal spirometry Allergic Rhinitis:  Runny nose, watery itchy eyes.  Year-round with seasonal flares in fall and spring.  Interested in allergy injections. - SPT environmental panel (04/24/22): positive to dust mites, cat, dog, grass pollen (fescue) and tree pollen (ash)  ---------------------------------- Today presents for follow-up. ***  Allergies as of 07/25/2022       Reactions   Eszopiclone    Other reaction(s): Other (See Comments) Generic for lunesta-caused stroke like symptoms   Gabapentin Other (See Comments)   Other reaction(s): GI Upset (intolerance)   Ivp Dye [iodinated Contrast Media] Nausea Only   Other    "allergy medications"= makes heart flutter   Prednisone Other (See Comments)   Elevates blood sugar   Ultracet [tramadol-acetaminophen] Nausea And Vomiting        Medication List        Accurate as of July 23, 2022  5:58 PM. If you have any questions, ask your nurse  or doctor.          AgaMatrix Presto Test test strip Generic drug: glucose blood Check sugars twice daily before meals   AgaMatrix Presto w/Device Kit Check blood sugars twice daily before meals   AgaMatrix Ultra-Thin Lancets Misc Check sugars twice daily before meals   albuterol 108 (90 Base) MCG/ACT inhaler Commonly known as: VENTOLIN HFA Inhale 1-2 puffs into the lungs every 6 (six) hours as needed for wheezing.   aspirin 81 MG tablet Take 81 mg by mouth daily.   atorvastatin 80 MG tablet Commonly known as: LIPITOR Take 80 mg by mouth daily.   azelastine 0.1 % nasal spray Commonly known as: ASTELIN Place 2 sprays into both nostrils 2 (two) times daily as needed for rhinitis. Use in each nostril as directed   cholecalciferol 1000 units tablet Commonly known as: VITAMIN D Take 2,000 Units by mouth at bedtime.   cyclobenzaprine 10 MG tablet Commonly known as: FLEXERIL Take 1 tablet (10 mg total) by mouth 2 (two) times daily as needed for muscle spasms.   diphenhydrAMINE 25 mg capsule Commonly known as: BENADRYL Take 25 mg by mouth every 6 (six) hours as needed for allergies.   fluticasone 50 MCG/ACT nasal spray Commonly known as: FLONASE Place 2 sprays into both nostrils daily.   fluticasone-salmeterol 250-50 MCG/ACT Aepb Commonly known as: ADVAIR Inhale 1 puff into the lungs in the morning and at bedtime.   furosemide 40 MG tablet Commonly known as: LASIX Take 1 tablet (40 mg total) by mouth daily for 5 days.   HYDROcodone-acetaminophen 5-325 MG tablet Commonly  known as: NORCO/VICODIN Take 1 tablet by mouth every 12 (twelve) hours as needed for moderate pain.   ibuprofen 200 MG tablet Commonly known as: ADVIL Take 200 mg by mouth daily as needed for headache or mild pain.   insulin aspart 100 UNIT/ML FlexTouch Pen Commonly known as: FIASP Inject 10 Units into the skin with breakfast, with lunch, and with evening meal.   Insulin Syringes (Disposable)  U-100 0.3 ML Misc 1 Syringe by Does not apply route as needed.   isosorbide mononitrate 30 MG 24 hr tablet Commonly known as: IMDUR Take 30 mg by mouth daily.   lidocaine 5 % Commonly known as: Lidoderm Place 1 patch onto the skin daily. Remove & Discard patch within 12 hours or as directed by MD   loratadine 10 MG tablet Commonly known as: Claritin Take 1 tablet (10 mg total) by mouth daily.   losartan 25 MG tablet Commonly known as: COZAAR TAKE 2 TABLETS BY MOUTH EVERY DAY   metoprolol tartrate 25 MG tablet Commonly known as: LOPRESSOR TAKE 1/2 (ONE-HALF) TABLET BY MOUTH TWICE DAILY   multivitamin tablet Take 1 tablet by mouth daily.   nitroGLYCERIN 0.4 MG SL tablet Commonly known as: NITROSTAT Place 1 tablet (0.4 mg total) under the tongue every 5 (five) minutes as needed for chest pain.   pantoprazole 40 MG tablet Commonly known as: PROTONIX Take 1 tablet (40 mg total) by mouth daily. Reported on 09/13/2015   TechLite Pen Needles 32G X 4 MM Misc Generic drug: Insulin Pen Needle USE TO ADMINISTER INSULIN ONCE DAILY   Tyler Aas FlexTouch 100 UNIT/ML FlexTouch Pen Generic drug: insulin degludec INJECT TWELVE UNITS UNDER THE SKIN DAILY       Past Medical History:  Diagnosis Date   Arthritis    Asthma    Cataract    NS OU   CHF (congestive heart failure) ([place])    Coma ([place])    Depression    Diabetes mellitus    Diabetic retinopathy ([place])    OU   Fibromyalgia    Gall stone    Gastroesophageal reflux disease    Hiatal hernia    Insomnia 08/07/2014   Myalgia and myositis, unspecified 01/13/2013   Stroke ([place]) 2007   Pt. evaluated at [facility] had left sided weakness.  Reportedly no cerebral imaging ever done.  Records we have received from [facility] dated 2011 do not document this.  Rest of records in storage and have not yet sent.   Past Surgical History:  Procedure Laterality Date   CESAREAN SECTION  1987,1988,1996,2006   INNER EAR  SURGERY     TUBAL LIGATION     Otherwise, there have been no changes to her past medical history, surgical history, family history, or social history.  ROS: All others negative except as noted per HPI.   Objective:  LMP 09/06/2015 (Exact Date)  There is no height or weight on file to calculate BMI. Physical Exam: General Appearance:  Alert, cooperative, no distress, appears stated age  Head:  Normocephalic, without obvious abnormality, atraumatic  Eyes:  Conjunctiva clear, EOM's intact  Nose: Nares normal, {Blank multiple:19196:a:"***","hypertrophic turbinates","normal mucosa","no visible anterior polyps","septum midline"}  Throat: Lips, tongue normal; teeth and gums normal, {Blank multiple:19196:a:"***","normal posterior oropharynx","tonsils 2+","tonsils 3+","no tonsillar exudate","+ cobblestoning"}  Neck: Supple, symmetrical  Lungs:   {Blank multiple:19196:a:"***","clear to auscultation bilaterally","end-expiratory wheezing","wheezing throughout"}, Respirations unlabored, {Blank multiple:19196:a:"***","no coughing","intermittent dry coughing"}  Heart:  {Blank multiple:19196:a:"***","regular rate and rhythm","no murmur"}, Appears well perfused  Extremities: No edema  Skin: Skin color, texture, turgor normal, no rashes or lesions on visualized portions of skin  Neurologic: No gross deficits   Reviewed: ***  Spirometry:  Tracings reviewed. Her effort: {Blank single:19197::"Good reproducible efforts.","It was hard to get consistent efforts and there is a question as to whether this reflects a maximal maneuver.","Poor effort, data can not be interpreted.","Variable effort-results affected.","decent for first attempt at spirometry."} FVC: ***L FEV1: ***L, ***% predicted FEV1/FVC ratio: ***% Interpretation: {Blank single:19197::"Spirometry consistent with mild obstructive disease","Spirometry consistent with moderate obstructive disease","Spirometry consistent with severe obstructive  disease","Spirometry consistent with possible restrictive disease","Spirometry consistent with mixed obstructive and restrictive disease","Spirometry uninterpretable due to technique","Spirometry consistent with normal pattern","No overt abnormalities noted given today's efforts"}.  Please see scanned spirometry results for details.  Skin Testing: {Blank single:19197::"Select foods","Environmental allergy panel","Environmental allergy panel and select foods","Food allergy panel","None","Deferred due to recent antihistamines use","deferred due to recent reaction"}. ***Adequate positive and negative controls Results discussed with patient/family.   {Blank single:19197::"Allergy testing results were read and interpreted by myself, documented by clinical staff."," "}  Assessment/Plan   ***  Sigurd Sos, MD  [facility] [place]

## 2022-07-25 ENCOUNTER — Ambulatory Visit: Payer: 59 | Admitting: Internal Medicine

## 2022-07-28 ENCOUNTER — Other Ambulatory Visit: Payer: Medicaid Other

## 2022-07-28 ENCOUNTER — Ambulatory Visit
Admission: RE | Admit: 2022-07-28 | Discharge: 2022-07-28 | Disposition: A | Discharge status: Home / Self Care | Payer: 59 | Source: Ambulatory Visit | Attending: Sports Medicine | Admitting: Sports Medicine

## 2022-07-28 DIAGNOSIS — M25532 Pain in left wrist: Secondary | ICD-10-CM

## 2022-07-28 DIAGNOSIS — S62115A Nondisplaced fracture of triquetrum [cuneiform] bone, left wrist, initial encounter for closed fracture: Secondary | ICD-10-CM | POA: Diagnosis not present

## 2022-07-28 NOTE — Progress Notes (Deleted)
FOLLOW UP Date of Service/Encounter:  07/28/22   Subjective:  Cassie Mata (DOB: 09/18/1965) is a 56 y.o. female who returns to the [facility] on 07/30/2022 in re-evaluation of the following: Allergic rhinitis, asthma History obtained from: chart review and {Persons; PED relatives w/patient:19415::"patient"}.   For Review, LV was on 04/24/22  with Dr.Cianna Kasparian seen for intial visit for allergic rhinitis and asthma .  We encouraged her to start her Advair 251 puff twice a day.  We also discussed allergy shots as well starting a nasal steroid spray, azelastine, Claritin.  She is using eyedrops prescribed by her ophthalmologist. She had nonspecific concerns regarding food allergy and her top 10 most common food allergens were negative on allergy testing  ------------------------------------------------ Pertinent History/Diagnostics:  Asthma: Diagnosed as an adult.  Never hospitalized.  Symptoms include cough and shortness of breath. - 2023-normal spirometry Allergic Rhinitis:  Runny nose, watery itchy eyes.  Year-round with seasonal flares in fall and spring.  Interested in allergy injections. - SPT environmental panel (04/24/22): positive to dust mites, cat, dog, grass pollen (fescue) and tree pollen (ash)  ---------------------------------- Today presents for follow-up. ***  Allergies as of 07/30/2022       Reactions   Eszopiclone    Other reaction(s): Other (See Comments) Generic for lunesta-caused stroke like symptoms   Gabapentin Other (See Comments)   Other reaction(s): GI Upset (intolerance)   Ivp Dye [iodinated Contrast Media] Nausea Only   Other    "allergy medications"= makes heart flutter   Prednisone Other (See Comments)   Elevates blood sugar   Ultracet [tramadol-acetaminophen] Nausea And Vomiting        Medication List        Accurate as of July 28, 2022 10:16 AM. If you have any questions, ask your nurse or doctor.          AgaMatrix  Presto Test test strip Generic drug: glucose blood Check sugars twice daily before meals   AgaMatrix Presto w/Device Kit Check blood sugars twice daily before meals   AgaMatrix Ultra-Thin Lancets Misc Check sugars twice daily before meals   albuterol 108 (90 Base) MCG/ACT inhaler Commonly known as: VENTOLIN HFA Inhale 1-2 puffs into the lungs every 6 (six) hours as needed for wheezing.   aspirin 81 MG tablet Take 81 mg by mouth daily.   atorvastatin 80 MG tablet Commonly known as: LIPITOR Take 80 mg by mouth daily.   azelastine 0.1 % nasal spray Commonly known as: ASTELIN Place 2 sprays into both nostrils 2 (two) times daily as needed for rhinitis. Use in each nostril as directed   cholecalciferol 1000 units tablet Commonly known as: VITAMIN D Take 2,000 Units by mouth at bedtime.   cyclobenzaprine 10 MG tablet Commonly known as: FLEXERIL Take 1 tablet (10 mg total) by mouth 2 (two) times daily as needed for muscle spasms.   diphenhydrAMINE 25 mg capsule Commonly known as: BENADRYL Take 25 mg by mouth every 6 (six) hours as needed for allergies.   fluticasone 50 MCG/ACT nasal spray Commonly known as: FLONASE Place 2 sprays into both nostrils daily.   fluticasone-salmeterol 250-50 MCG/ACT Aepb Commonly known as: ADVAIR Inhale 1 puff into the lungs in the morning and at bedtime.   furosemide 40 MG tablet Commonly known as: LASIX Take 1 tablet (40 mg total) by mouth daily for 5 days.   HYDROcodone-acetaminophen 5-325 MG tablet Commonly known as: NORCO/VICODIN Take 1 tablet by mouth every 12 (twelve) hours as needed  for moderate pain.   ibuprofen 200 MG tablet Commonly known as: ADVIL Take 200 mg by mouth daily as needed for headache or mild pain.   insulin aspart 100 UNIT/ML FlexTouch Pen Commonly known as: FIASP Inject 10 Units into the skin with breakfast, with lunch, and with evening meal.   Insulin Syringes (Disposable) U-100 0.3 ML Misc 1 Syringe by  Does not apply route as needed.   isosorbide mononitrate 30 MG 24 hr tablet Commonly known as: IMDUR Take 30 mg by mouth daily.   lidocaine 5 % Commonly known as: Lidoderm Place 1 patch onto the skin daily. Remove & Discard patch within 12 hours or as directed by MD   loratadine 10 MG tablet Commonly known as: Claritin Take 1 tablet (10 mg total) by mouth daily.   losartan 25 MG tablet Commonly known as: COZAAR TAKE 2 TABLETS BY MOUTH EVERY DAY   metoprolol tartrate 25 MG tablet Commonly known as: LOPRESSOR TAKE 1/2 (ONE-HALF) TABLET BY MOUTH TWICE DAILY   multivitamin tablet Take 1 tablet by mouth daily.   nitroGLYCERIN 0.4 MG SL tablet Commonly known as: NITROSTAT Place 1 tablet (0.4 mg total) under the tongue every 5 (five) minutes as needed for chest pain.   pantoprazole 40 MG tablet Commonly known as: PROTONIX Take 1 tablet (40 mg total) by mouth daily. Reported on 09/13/2015   TechLite Pen Needles 32G X 4 MM Misc Generic drug: Insulin Pen Needle USE TO ADMINISTER INSULIN ONCE DAILY   Tresiba FlexTouch 100 UNIT/ML FlexTouch Pen Generic drug: insulin degludec INJECT TWELVE UNITS UNDER THE SKIN DAILY       Past Medical History:  Diagnosis Date   Arthritis    Asthma    Cataract    NS OU   CHF (congestive heart failure) ([facility])    Coma ([facility])    Depression    Diabetes mellitus    Diabetic retinopathy ([facility])    OU   Fibromyalgia    Gall stone    Gastroesophageal reflux disease    Hiatal hernia    Insomnia 08/07/2014   Myalgia and myositis, unspecified 01/13/2013   Stroke ([facility]) 2007   Pt. evaluated at [facility]--states had left sided weakness.  Reportedly no cerebral imaging ever done.  Records we have received from [facility] dated 2011 do not document this.  Rest of records in storage and have not yet sent.   Past Surgical History:  Procedure Laterality Date   CESAREAN SECTION  1987,1988,1996,2006   INNER EAR SURGERY     TUBAL LIGATION      Otherwise, there have been no changes to her past medical history, surgical history, family history, or social history.  ROS: All others negative except as noted per HPI.   Objective:  LMP 09/06/2015 (Exact Date)  There is no height or weight on file to calculate BMI. Physical Exam: General Appearance:  Alert, cooperative, no distress, appears stated age  Head:  Normocephalic, without obvious abnormality, atraumatic  Eyes:  Conjunctiva clear, EOM's intact  Nose: Nares normal, {Blank multiple:19196:a:"***","hypertrophic turbinates","normal mucosa","no visible anterior polyps","septum midline"}  Throat: Lips, tongue normal; teeth and gums normal, {Blank multiple:19196:a:"***","normal posterior oropharynx","tonsils 2+","tonsils 3+","no tonsillar exudate","+ cobblestoning"}  Neck: Supple, symmetrical  Lungs:   {Blank multiple:19196:a:"***","clear to auscultation bilaterally","end-expiratory wheezing","wheezing throughout"}, Respirations unlabored, {Blank multiple:19196:a:"***","no coughing","intermittent dry coughing"}  Heart:  {Blank multiple:19196:a:"***","regular rate and rhythm","no murmur"}, Appears well perfused  Extremities: No edema  Skin: Skin color, texture, turgor normal, no rashes or lesions on visualized portions   of skin  Neurologic: No gross deficits   Reviewed: ***  Spirometry:  Tracings reviewed. Her effort: {Blank single:19197::"Good reproducible efforts.","It was hard to get consistent efforts and there is a question as to whether this reflects a maximal maneuver.","Poor effort, data can not be interpreted.","Variable effort-results affected.","decent for first attempt at spirometry."} FVC: ***L FEV1: ***L, ***% predicted FEV1/FVC ratio: ***% Interpretation: {Blank single:19197::"Spirometry consistent with mild obstructive disease","Spirometry consistent with moderate obstructive disease","Spirometry consistent with severe obstructive disease","Spirometry consistent with  possible restrictive disease","Spirometry consistent with mixed obstructive and restrictive disease","Spirometry uninterpretable due to technique","Spirometry consistent with normal pattern","No overt abnormalities noted given today's efforts"}.  Please see scanned spirometry results for details.  Skin Testing: {Blank single:19197::"Select foods","Environmental allergy panel","Environmental allergy panel and select foods","Food allergy panel","None","Deferred due to recent antihistamines use","deferred due to recent reaction"}. ***Adequate positive and negative controls Results discussed with patient/family.   {Blank single:19197::"Allergy testing results were read and interpreted by myself, documented by clinical staff."," "}  Assessment/Plan   ***  Erin Dennis, MD  [facility]       

## 2022-07-30 ENCOUNTER — Ambulatory Visit: Payer: 59 | Admitting: Internal Medicine

## 2022-07-30 DIAGNOSIS — J309 Allergic rhinitis, unspecified: Secondary | ICD-10-CM

## 2022-08-14 DIAGNOSIS — Z794 Long term (current) use of insulin: Secondary | ICD-10-CM | POA: Diagnosis not present

## 2022-08-14 DIAGNOSIS — E113513 Type 2 diabetes mellitus with proliferative diabetic retinopathy with macular edema, bilateral: Secondary | ICD-10-CM | POA: Diagnosis not present

## 2022-08-14 DIAGNOSIS — H4313 Vitreous hemorrhage, bilateral: Secondary | ICD-10-CM | POA: Diagnosis not present

## 2022-08-18 DIAGNOSIS — M25552 Pain in left hip: Secondary | ICD-10-CM | POA: Diagnosis not present

## 2022-08-18 DIAGNOSIS — M79672 Pain in left foot: Secondary | ICD-10-CM | POA: Diagnosis not present

## 2022-08-18 DIAGNOSIS — M545 Low back pain, unspecified: Secondary | ICD-10-CM | POA: Diagnosis not present

## 2022-08-26 DIAGNOSIS — M545 Low back pain, unspecified: Secondary | ICD-10-CM | POA: Diagnosis not present

## 2022-08-26 DIAGNOSIS — M25552 Pain in left hip: Secondary | ICD-10-CM | POA: Diagnosis not present

## 2022-08-26 DIAGNOSIS — M25572 Pain in left ankle and joints of left foot: Secondary | ICD-10-CM | POA: Diagnosis not present

## 2022-09-26 NOTE — Therapy (Signed)
OUTPATIENT PHYSICAL THERAPY THORACOLUMBAR EVALUATION   Patient Name: Cassie Mata MRN: [id number] DOB:02-05-1966, 57 y.o., female Today's Date: 09/30/2022  END OF SESSION:  PT End of Session - 09/30/22 0900     Visit Number 1    Number of Visits 13    Date for PT Re-Evaluation 11/25/22    Authorization Type [employer] MCD    Authorization Time Period no auth    Authorization - Visit Number 1    Authorization - Number of Visits 27   VL   PT Start Time 0902   late check in   PT Stop Time 0935    PT Time Calculation (min) 33 min    Activity Tolerance Patient tolerated treatment well;Patient limited by pain    Behavior During Therapy [facility] for tasks assessed/performed             Past Medical History:  Diagnosis Date   Arthritis    Asthma    Cataract    NS OU   CHF (congestive heart failure) ([place])    Coma ([place])    Depression    Diabetes mellitus    Diabetic retinopathy ([place])    OU   Fibromyalgia    Gall stone    Gastroesophageal reflux disease    Hiatal hernia    Insomnia 08/07/2014   Myalgia and myositis, unspecified 01/13/2013   Stroke ([place]) 2007   Pt. evaluated at [facility] had left sided weakness.  Reportedly no cerebral imaging ever done.  Records we have received from [facility] dated 2011 do not document this.  Rest of records in storage and have not yet sent.   Past Surgical History:  Procedure Laterality Date   CESAREAN SECTION  [phone]   INNER EAR SURGERY     TUBAL LIGATION     Patient Active Problem List   Diagnosis Date Noted   Moderate persistent asthma without complication 93/23/5573   Seasonal and perennial allergic rhinitis 04/24/2022   Allergic conjunctivitis of both eyes 04/24/2022   Osteoarthritis of hand 06/24/2021   Pain in left hand 06/04/2021   Paget's bone disease 06/04/2021   Constipation    Vomiting in adult 03/22/2021   AKI (acute kidney injury) ([place]) 03/22/2021   Metacarpal bone fracture 03/13/2021    Cerebrovascular accident (CVA) ([place]) 22/10/5425   Chronic systolic congestive heart failure ([place]) 11/08/2020   OSA (obstructive sleep apnea) 11/08/2020   Tobacco abuse, in remission 11/08/2020   Diabetic cataract of both eyes ([place]) 10/18/2020   Proliferative diabetic retinopathy of both eyes with macular edema associated with type 2 diabetes mellitus ([place]) 10/18/2020   Vitamin D deficiency 12/20/2019   Diabetes mellitus type II, uncontrolled 08/08/2015   Essential hypertension 08/08/2015   GERD (gastroesophageal reflux disease) 08/08/2015   Chronic low back pain with sciatica 08/08/2015   Arthritis 08/08/2015   Type 2 diabetes mellitus ([place]) 08/08/2015   Depression 06/15/2015   Insomnia 08/07/2014   Myalgia and myositis 01/13/2013    PCP: Deborah Chalk, FNP  REFERRING PROVIDER: Pedro Earls, MD  REFERRING DIAG: M54.50 (ICD-10-CM) - Low back pain, unspecified  Rationale for Evaluation and Treatment: Rehabilitation  THERAPY DIAG:  Other low back pain  Pain in left hip  Muscle weakness (generalized)  Other abnormalities of gait and mobility  ONSET DATE: December 2023  SUBJECTIVE:  SUBJECTIVE STATEMENT: Pt reports a slip and fall where she twisted her leg (December 10th 2023). Pt she immediately felt a strong pull and pain that gradually worsened, symptoms seem to have hit a plateau overall but do fluctuate based on activity. Pt states she often pushes through symptoms, changing positions tends to make pain more tolerable. Pt states she feels groin pain and back pain feel somewhat distinct from each other in provocative factors.  No N/T, no issues w/ bowel/bladder, no new fevers/night sweats. Does endorse remote history of legs buckling but none recently. Has had prior hx of back pain. Reports  ankle pain after fall which she states is improving.  PERTINENT HISTORY:  HTN, CHF, CVA, OSA, GERD, DM2, multiple MSK issues, depression, CKD3, fibromyalgia  PAIN:  Are you having pain: yes 7/10 Location: low back and L groin How would you describe your pain? Dull, aching Best in past week: 5/10  Worst in past week: 10/10 Aggravating factors: picking up from floor, stairs (going up especially), transfers, prolonged standing/walking, sitting 10-30 min, lying on left side Easing factors: changing positions, self massage, pain cream  PRECAUTIONS: fall risk, cardiac hx  WEIGHT BEARING RESTRICTIONS: No  FALLS:  Has patient fallen in last 6 months? Yes. Number of falls 2 falls including precipitating episode, pt denies any balance issues  LIVING ENVIRONMENT: 1 story home with ramped entrance, lives w/ son. 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   PATIENT SURVEYS:  Modified oswestry: 22/50 raw, 44%   SCREENING FOR RED FLAGS: Unremarkable red flag screening  COGNITION: Overall cognitive status: Within functional limits for tasks assessed     SENSATION/NEURO: Light touch intact BLE No clonus No ataxia with gait   POSTURE: fwd head, rounded shoulders, reduced lordosis   PALPATION: Unable to test due to time constraints  LUMBAR ROM:   AROM eval  Flexion   Extension   Right lateral flexion   Left lateral flexion   Right rotation (seated) 75%  Left rotation (seated) 75%   (Blank rows = not tested)  LOWER EXTREMITY ROM:     Active  Right eval Left eval  Hip flexion    Hip extension    Hip internal  rotation    Hip external rotation    (Blank rows = not tested) (Key: WFL = within functional limits not formally assessed, * = concordant pain, s = stiffness/stretching sensation, NT = not tested)  Comments:    LOWER EXTREMITY MMT:    MMT Right eval Left eval  Hip flexion 4 3+ *  Hip abduction (modified sitting) 5 5  Hip internal rotation    Hip external rotation    Knee flexion 4 4  Knee extension 4 4   (Blank rows = not tested) (Key: WFL = within functional limits not formally assessed, * = concordant pain, s = stiffness/stretching sensation, NT = not tested)  Comments: hip rotation MMT deferred due to symptom irritability   FUNCTIONAL TESTS:  30sec STS 4.5 repetitions heavy UE support and increasing back pain   GAIT: Distance walked: with clinic Assistive device utilized:  None Level of assistance: Complete Independence Comments: wide BOS, reduced step length, reduced trunk rotation B, antalgic gait on L   TODAY'S TREATMENT:                                                                                                                              [facility] Adult PT Treatment:                                                DATE: deferred given time constraints  PATIENT EDUCATION:  Education details: Pt education on PT impairments, prognosis, and POC. Informed consent. Rationale for intervention  Person educated: Patient Education method: Explanation, Demonstration, Tactile cues, Verbal cues Education comprehension: verbalized understanding, returned demonstration, verbal cues required, tactile cues required, and needs further education    HOME EXERCISE PROGRAM: Deferred given time constraints  ASSESSMENT:  CLINICAL IMPRESSION: Patient is a 57 y.o. woman who was seen today for physical therapy evaluation and treatment for low back pain after sustaining a fall in December. Pt reports significant difficulty and limitations in daily and work activities due to pain, reports  tendency to push through symptoms to perform activities. Today's examination is limited due to time constraints and symptom irritability, 30sec STS score indicative of fall risk and increases symptoms, L hip weakness on exam. Unable to assess lumbar mobility and hip joint more fully due to time/symptoms, plan to assess as able/appropriate. Pt agreeable to POC, recommend skilled PT with aim of reducing symptoms and gradually increasing tolerance to activity. No adverse events. Pt departs today's session in no acute distress, all voiced questions/concerns addressed appropriately from PT perspective.    OBJECTIVE IMPAIRMENTS: Abnormal gait, decreased activity tolerance, decreased endurance, decreased mobility, difficulty walking, decreased ROM, decreased strength, impaired perceived functional ability, improper body mechanics, postural dysfunction, and pain.   ACTIVITY LIMITATIONS: carrying, lifting, bending, sitting, standing, squatting, sleeping, stairs, transfers, and locomotion level  PARTICIPATION LIMITATIONS: meal prep, cleaning, laundry, driving, community activity, and occupation  PERSONAL FACTORS: 3+ comorbidities: HTN, CHF, CVA, OSA, DM2, multiple MSK issues, depression  are also affecting patient's functional outcome.   REHAB POTENTIAL: Fair given comorbidities and severity of symptoms  CLINICAL DECISION MAKING: Evolving/moderate complexity  EVALUATION COMPLEXITY: Moderate   GOALS: Goals reviewed with patient? No given time constraints   SHORT TERM GOALS: Target date: 10/21/2022 Pt will demonstrate appropriate understanding and performance of initially prescribed HEP in order to facilitate improved independence with management of symptoms.  Baseline: HEP provided on eval Goal status: INITIAL   2. Pt will score less than or equal to 38% on ODI in order to demonstrate improved perception of function due to symptoms.  Baseline: 44%  Goal status: INITIAL    LONG TERM GOALS: Target  date: 11/11/2022 Pt will score less than or equal to 30% on  ODI  in order to demonstrate improved perception of functional status due to symptoms.  Baseline: 44% Goal status: INITIAL  2.  Pt will report ability to stand/walk for up to with less than 2pt increase in resting pain in order to improve functional tolerance.  Baseline: up to 10/10 pain with functional mobility Goal status: INITIAL  3.  Pt will demonstrate hip flexion MMT of 4/5 bilaterally in order to demonstrate improved strength for functional movements.  Baseline: see MMT chart above Goal status: INITIAL  4. Pt will perform >8 repetitions for 30sec STS in order to demonstrate reduced fall risk and improved functional independence. (MCID of 2 repetitions, fall risk cutoff score 12-17 reps)  Baseline: 4.5 repetition  Goal status: INITIAL   5. Pt will demonstrate ability to lift up to 10# with less than 4/10 pain on NPS in order to demonstrate improved tolerance to household activities.   Baseline: NT due to symptom irritability, pt reports up to 10/10 pain with lifting  Goal status: INITIAL  PLAN:  PT FREQUENCY: 1-2x/week  PT DURATION: 6 weeks  PLANNED INTERVENTIONS: Therapeutic exercises, Therapeutic activity, Neuromuscular re-education, Balance training, Gait training, Patient/Family education, Self Care, Joint mobilization, Stair training, Aquatic Therapy, Dry Needling, Electrical stimulation, Spinal mobilization, Cryotherapy, Moist heat, Taping, Manual therapy, and Re-evaluation.  PLAN FOR NEXT SESSION: Look at lumbar ROM and L hip joint. Establish HEP, anticipate basic lumbopelvic activation exercises   Ashley Murrain PT, DPT 09/30/2022 10:09 AM

## 2022-09-30 ENCOUNTER — Other Ambulatory Visit: Payer: Self-pay

## 2022-09-30 ENCOUNTER — Encounter: Payer: Self-pay | Admitting: Physical Therapy

## 2022-09-30 ENCOUNTER — Ambulatory Visit: Payer: Medicaid Other | Attending: Nurse Practitioner | Admitting: Physical Therapy

## 2022-09-30 DIAGNOSIS — M6281 Muscle weakness (generalized): Secondary | ICD-10-CM | POA: Insufficient documentation

## 2022-09-30 DIAGNOSIS — R2689 Other abnormalities of gait and mobility: Secondary | ICD-10-CM | POA: Diagnosis present

## 2022-09-30 DIAGNOSIS — M5459 Other low back pain: Secondary | ICD-10-CM | POA: Diagnosis not present

## 2022-09-30 DIAGNOSIS — M25552 Pain in left hip: Secondary | ICD-10-CM | POA: Insufficient documentation

## 2022-10-06 ENCOUNTER — Encounter: Payer: Self-pay | Admitting: Physical Therapy

## 2022-10-06 ENCOUNTER — Ambulatory Visit: Payer: Medicaid Other | Admitting: Physical Therapy

## 2022-10-06 DIAGNOSIS — M25552 Pain in left hip: Secondary | ICD-10-CM

## 2022-10-06 DIAGNOSIS — M6281 Muscle weakness (generalized): Secondary | ICD-10-CM

## 2022-10-06 DIAGNOSIS — M5459 Other low back pain: Secondary | ICD-10-CM

## 2022-10-06 NOTE — Therapy (Signed)
OUTPATIENT PHYSICAL THERAPY TREATMENT NOTE   Patient Name: Cassie Mata MRN: [id number] DOB:05-06-1966, 57 y.o., female Today's Date: 10/06/2022  PCP: Eather Colas, FNP   REFERRING PROVIDER: Delfin Gant, MD  END OF SESSION:   PT End of Session - 10/06/22 1100     Visit Number 2    Number of Visits 13    Date for PT Re-Evaluation 11/25/22    Authorization Type UHC MCD    Authorization Time Period no auth    Authorization - Visit Number 2    Authorization - Number of Visits 27    PT Start Time 1100    PT Stop Time 1140    PT Time Calculation (min) 40 min             Past Medical History:  Diagnosis Date   Arthritis    Asthma    Cataract    NS OU   CHF (congestive heart failure) ([facility])    Coma ([facility])    Depression    Diabetes mellitus    Diabetic retinopathy ([facility])    OU   Fibromyalgia    Gall stone    Gastroesophageal reflux disease    Hiatal hernia    Insomnia 08/07/2014   Myalgia and myositis, unspecified 01/13/2013   Stroke ([facility]) 2007   Pt[facility] at [facility] had left sided weakness.  Reportedly no cerebral imaging ever done.  Records we have received from [facility] dated 2011 do not document this.  Rest of records in storage and have not yet sent.   Past Surgical History:  Procedure Laterality Date   CESAREAN SECTION  [phone]   INNER EAR SURGERY     TUBAL LIGATION     Patient Active Problem List   Diagnosis Date Noted   Moderate persistent asthma without complication 04/24/2022   Seasonal and perennial allergic rhinitis 04/24/2022   Allergic conjunctivitis of both eyes 04/24/2022   Osteoarthritis of hand 06/24/2021   Pain in left hand 06/04/2021   Paget's bone disease 06/04/2021   Constipation    Vomiting in adult 03/22/2021   AKI (acute kidney injury) ([facility]) 03/22/2021   Metacarpal bone fracture 03/13/2021   Cerebrovascular accident (CVA) ([facility]) 11/08/2020   Chronic systolic congestive heart failure  ([facility]) 11/08/2020   OSA (obstructive sleep apnea) 11/08/2020   Tobacco abuse, in remission 11/08/2020   Diabetic cataract of both eyes ([facility]) 10/18/2020   Proliferative diabetic retinopathy of both eyes with macular edema associated with type 2 diabetes mellitus ([facility]) 10/18/2020   Vitamin D deficiency 12/20/2019   Diabetes mellitus type II, uncontrolled 08/08/2015   Essential hypertension 08/08/2015   GERD (gastroesophageal reflux disease) 08/08/2015   Chronic low back pain with sciatica 08/08/2015   Arthritis 08/08/2015   Type 2 diabetes mellitus ([facility]) 08/08/2015   Depression 06/15/2015   Insomnia 08/07/2014   Myalgia and myositis 01/13/2013    REFERRING DIAG: M54.50 (ICD-10-CM) - Low back pain, unspecified  THERAPY DIAG:  Other low back pain  Pain in left hip  Muscle weakness (generalized)  Rationale for Evaluation and Treatment Rehabilitation  PERTINENT HISTORY:  HTN, CHF, CVA, OSA, GERD, DM2, multiple MSK issues, depression, CKD3, fibromyalgia  PRECAUTIONS: fall risk, cardiac hx  SUBJECTIVE:  SUBJECTIVE STATEMENT:  I am hurting. 7/10.    PAIN:  Are you having pain: yes 7/10 Location: low back and L groin How would you describe your pain? Dull, aching Best in past week: 5/10  Worst in past week: 10/10 Aggravating factors: picking up from floor, stairs (going up especially), transfers, prolonged standing/walking, sitting 10-30 min, lying on left side Easing factors: changing positions, self massage, pain cream   OBJECTIVE: (objective measures completed at initial evaluation unless otherwise dated)   DIAGNOSTIC FINDINGS:  No recent spine imaging in EPIC - pt states she had imaging of back and L hip after fall, states she thinks it showed a spur in her hip, states she does not think it  showed any fracture.    PATIENT SURVEYS:  Modified oswestry: 22/50 raw, 44%    SCREENING FOR RED FLAGS: Unremarkable red flag screening   COGNITION: Overall cognitive status: Within functional limits for tasks assessed                          SENSATION/NEURO: Light touch intact BLE No clonus No ataxia with gait     POSTURE: fwd head, rounded shoulders, reduced lordosis    PALPATION: Unable to test due to time constraints   LUMBAR ROM:    AROM eval 10/06/22  Flexion   Reaches mid shin , pain  Extension   10 most  painful   Right lateral flexion   Reach mid knee , pain  Left lateral flexion   Reaches 1 inch past knee, pain  Right rotation (seated) 75% Standing 50% pain  Left rotation (seated) 75% Standing 75%   (Blank rows = not tested)   LOWER EXTREMITY ROM:      Passive  Right eval Left eval Right 10/06/22 Left 10/06/22  Hip flexion     [facility]  Hip extension        Hip internal rotation     20 25  Hip external rotation     45  40 pain  10/06/22: Hamstring length: R 90, L 75 from hooklying  (Blank rows = not tested) (Key: WFL = within functional limits not formally assessed, * = concordant pain, s = stiffness/stretching sensation, NT = not tested)  Comments:     LOWER EXTREMITY MMT:     MMT Right eval Left eval  Hip flexion 4 3+ *  Hip abduction (modified sitting) 5 5  Hip internal rotation      Hip external rotation      Knee flexion 4 4  Knee extension 4 4    (Blank rows = not tested) (Key: WFL = within functional limits not formally assessed, * = concordant pain, s = stiffness/stretching sensation, NT = not tested)  Comments: hip rotation MMT deferred due to symptom irritability     FUNCTIONAL TESTS:  30sec STS 4.5 repetitions heavy UE support and increasing back pain    GAIT: Distance walked: with clinic Assistive device utilized: None Level of assistance: Complete Independence Comments: wide BOS, reduced step length, reduced trunk rotation  B, antalgic gait on L    TODAY'S TREATMENT:  [facility] Adult PT Treatment:                                                DATE: 10/06/22 Therapeutic Exercise: Lumbar AROM Bilat hip PROM Bilat hamstring PROM , right 90 , left 75 LTR SKTC - painful actively- improved with use of towel  Tra activation with ball squeeze Tra activation with  small red band clam Supine gluteal sets     [facility] Adult PT Treatment:                                                DATE: EVAL deferred given time constraints   PATIENT EDUCATION:  Education details: Pt education on PT impairments, prognosis, and POC. Informed consent. Rationale for intervention  Person educated: Patient Education method: Explanation, Demonstration, Tactile cues, Verbal cues Education comprehension: verbalized understanding, returned demonstration, verbal cues required, tactile cues required, and needs further education     HOME EXERCISE PROGRAM: Deferred given time constraints Access Code: [email or web address] URL: [email or web address] Date: 10/06/2022 Prepared by: Hessie Diener  Exercises - knee to chest- use towel to assist  - 1 x daily - 7 x weekly - 1 sets - 3 reps - 5-10 hold - Supine Lower Trunk Rotation  - 1 x daily - 7 x weekly - 1 sets - 10 reps - Supine Gluteal Sets  - 1 x daily - 7 x weekly - 2 sets - 10 reps - 5 hold - Supine Hip Adduction Isometric with Ball  - 1 x daily - 7 x weekly - 2 sets - 10 reps - 5 hold - Hooklying Clamshell with Resistance  - 2 x daily - 7 x weekly - 1 sets - 10 reps - 5 hold    ASSESSMENT:   CLINICAL IMPRESSION: Patient is a 57 y.o. woman who was seen today for physical therapy treatment for low back pain after sustaining a fall in December. Time spent with measuring Lumbar AROM and bilat hip PROM measurements. Addressed ROM deficits with stretches and established  an initial HEP for core and hip activation. She was given several HEP exercises to try and will plan to assess response and adjust at next session. Pt agreeable to continue POC of skilled PT with aim of reducing symptoms and gradually increasing tolerance to activity. At end of session, she reports no change in pain, and anticipates pain will increase when she sit back down.    OBJECTIVE IMPAIRMENTS: Abnormal gait, decreased activity tolerance, decreased endurance, decreased mobility, difficulty walking, decreased ROM, decreased strength, impaired perceived functional ability, improper body mechanics, postural dysfunction, and pain.    ACTIVITY LIMITATIONS: carrying, lifting, bending, sitting, standing, squatting, sleeping, stairs, transfers, and locomotion level   PARTICIPATION LIMITATIONS: meal prep, cleaning, laundry, driving, community activity, and occupation   PERSONAL FACTORS: 3+ comorbidities: HTN, CHF, CVA, OSA, DM2, multiple MSK issues, depression  are also affecting patient's functional outcome.    REHAB POTENTIAL: Fair given comorbidities and severity of symptoms   CLINICAL DECISION MAKING: Evolving/moderate complexity   EVALUATION COMPLEXITY: Moderate     GOALS: Goals reviewed with patient? No given time constraints    SHORT TERM GOALS: Target date: 10/21/2022 Pt will demonstrate appropriate understanding and performance of initially  prescribed HEP in order to facilitate improved independence with management of symptoms.  Baseline: HEP provided on eval Goal status: INITIAL    2. Pt will score less than or equal to 38% on ODI in order to demonstrate improved perception of function due to symptoms.            Baseline: 44%            Goal status: INITIAL     LONG TERM GOALS: Target date: 11/11/2022 Pt will score less than or equal to 30% on ODI  in order to demonstrate improved perception of functional status due to symptoms.  Baseline: 44% Goal status: INITIAL   2.  Pt will  report ability to stand/walk for up to 40min with less than 2pt increase in resting pain in order to improve functional tolerance.  Baseline: up to 10/10 pain with functional mobility Goal status: INITIAL   3.  Pt will demonstrate hip flexion MMT of 4/5 bilaterally in order to demonstrate improved strength for functional movements.  Baseline: see MMT chart above Goal status: INITIAL   4. Pt will perform >8 repetitions for 30sec STS in order to demonstrate reduced fall risk and improved functional independence. (MCID of 2 repetitions, fall risk cutoff score 12-17 reps)            Baseline: 4.5 repetition            Goal status: INITIAL    5. Pt will demonstrate ability to lift up to 10# with less than 4/10 pain on NPS in order to demonstrate improved tolerance to household activities.             Baseline: NT due to symptom irritability, pt reports up to 10/10 pain with lifting            Goal status: INITIAL   PLAN:   PT FREQUENCY: 1-2x/week   PT DURATION: 6 weeks   PLANNED INTERVENTIONS: Therapeutic exercises, Therapeutic activity, Neuromuscular re-education, Balance training, Gait training, Patient/Family education, Self Care, Joint mobilization, Stair training, Aquatic Therapy, Dry Needling, Electrical stimulation, Spinal mobilization, Cryotherapy, Moist heat, Taping, Manual therapy, and Re-evaluation.   PLAN FOR NEXT SESSION: assess response to initial HEP;  Look at lumbar ROM and L hip joint. Establish HEP, anticipate basic lumbopelvic activation exercises     Hessie Diener, PTA 10/06/22 1:15 PM Phone: [phone] Fax: [phone]

## 2022-10-10 ENCOUNTER — Ambulatory Visit: Payer: Medicaid Other | Attending: Nurse Practitioner | Admitting: Physical Therapy

## 2022-10-10 ENCOUNTER — Encounter: Payer: Self-pay | Admitting: Physical Therapy

## 2022-10-10 DIAGNOSIS — M25552 Pain in left hip: Secondary | ICD-10-CM

## 2022-10-10 DIAGNOSIS — M5459 Other low back pain: Secondary | ICD-10-CM

## 2022-10-10 DIAGNOSIS — R2689 Other abnormalities of gait and mobility: Secondary | ICD-10-CM | POA: Diagnosis present

## 2022-10-10 DIAGNOSIS — M6281 Muscle weakness (generalized): Secondary | ICD-10-CM

## 2022-10-10 NOTE — Therapy (Signed)
OUTPATIENT PHYSICAL THERAPY TREATMENT NOTE   Patient Name: Cassie Mata MRN: [id number] DOB:22-Oct-1965, 57 y.o., female Today's Date: 10/10/2022  PCP: Eather Colas, FNP   REFERRING PROVIDER: Delfin Gant, MD  END OF SESSION:   PT End of Session - 10/10/22 0731     Visit Number 3    Number of Visits 13    Date for PT Re-Evaluation 11/25/22    Authorization Type U[employer] MCD    Authorization Time Period no auth    Authorization - Visit Number 3    PT Start Time [phone]   pt arrived late and went back to car   PT Stop Time 0758    PT Time Calculation (min) 30 min             Past Medical History:  Diagnosis Date   Arthritis    Asthma    Cataract    NS OU   CHF (congestive heart failure) (HCC)    Coma (HCC)    Depression    Diabetes mellitus    Diabetic retinopathy (HCC)    OU   Fibromyalgia    Gall stone    Gastroesophageal reflux disease    Hiatal hernia    Insomnia 08/07/2014   Myalgia and myositis, unspecified 01/13/2013   Stroke (HCC) 2007   Pt. evaluated at [facility] had left sided weakness.  Reportedly no cerebral imaging ever done.  Records we have received from [facility] dated 2011 do not document this.  Rest of records in storage and have not yet sent.   Past Surgical History:  Procedure Laterality Date   CESAREAN SECTION  [phone]   INNER EAR SURGERY     TUBAL LIGATION     Patient Active Problem List   Diagnosis Date Noted   Moderate persistent asthma without complication 04/24/2022   Seasonal and perennial allergic rhinitis 04/24/2022   Allergic conjunctivitis of both eyes 04/24/2022   Osteoarthritis of hand 06/24/2021   Pain in left hand 06/04/2021   Paget's bone disease 06/04/2021   Constipation    Vomiting in adult 03/22/2021   AKI (acute kidney injury) (HCC) 03/22/2021   Metacarpal bone fracture 03/13/2021   Cerebrovascular accident (CVA) (HCC) 11/08/2020   Chronic systolic congestive heart failure (HCC)  11/08/2020   OSA (obstructive sleep apnea) 11/08/2020   Tobacco abuse, in remission 11/08/2020   Diabetic cataract of both eyes (HCC) 10/18/2020   Proliferative diabetic retinopathy of both eyes with macular edema associated with type 2 diabetes mellitus (HCC) 10/18/2020   Vitamin D deficiency 12/20/2019   Diabetes mellitus type II, uncontrolled 08/08/2015   Essential hypertension 08/08/2015   GERD (gastroesophageal reflux disease) 08/08/2015   Chronic low back pain with sciatica 08/08/2015   Arthritis 08/08/2015   Type 2 diabetes mellitus (HCC) 08/08/2015   Depression 06/15/2015   Insomnia 08/07/2014   Myalgia and myositis 01/13/2013    REFERRING DIAG: M54.50 (ICD-10-CM) - Low back pain, unspecified  THERAPY DIAG:  Other low back pain  Pain in left hip  Muscle weakness (generalized)  Rationale for Evaluation and Treatment Rehabilitation  PERTINENT HISTORY:  HTN, CHF, CVA, OSA, GERD, DM2, multiple MSK issues, depression, CKD3, fibromyalgia  PRECAUTIONS: fall risk, cardiac hx  SUBJECTIVE:  SUBJECTIVE STATEMENT:  Pain is 3/10. I was a little sore after last session, but I was okay. The Nustep is increasing my pain.    PAIN:  Are you having pain: yes 3/10 Location: low back and L groin How would you describe your pain? Dull, aching Best in past week: 5/10  Worst in past week: 10/10 Aggravating factors: picking up from floor, stairs (going up especially), transfers, prolonged standing/walking, sitting 10-30 min, lying on left side Easing factors: changing positions, self massage, pain cream   OBJECTIVE: (objective measures completed at initial evaluation unless otherwise dated)   DIAGNOSTIC FINDINGS:  No recent spine imaging in EPIC - pt states she had imaging of back and L hip after fall,  states she thinks it showed a spur in her hip, states she does not think it showed any fracture.    PATIENT SURVEYS:  Modified oswestry: 22/50 raw, 44%    SCREENING FOR RED FLAGS: Unremarkable red flag screening   COGNITION: Overall cognitive status: Within functional limits for tasks assessed                          SENSATION/NEURO: Light touch intact BLE No clonus No ataxia with gait     POSTURE: fwd head, rounded shoulders, reduced lordosis    PALPATION: Unable to test due to time constraints   LUMBAR ROM:    AROM eval 10/06/22  Flexion   Reaches mid shin , pain  Extension   10 most  painful   Right lateral flexion   Reach mid knee , pain  Left lateral flexion   Reaches 1 inch past knee, pain  Right rotation (seated) 75% Standing 50% pain  Left rotation (seated) 75% Standing 75%   (Blank rows = not tested)   LOWER EXTREMITY ROM:      Passive  Right eval Left eval Right 10/06/22 Left 10/06/22  Hip flexion     [facility]  Hip extension        Hip internal rotation     20 25  Hip external rotation     45  40 pain  10/06/22: Hamstring length: R 90, L 75 from hooklying  (Blank rows = not tested) (Key: WFL = within functional limits not formally assessed, * = concordant pain, s = stiffness/stretching sensation, NT = not tested)  Comments:     LOWER EXTREMITY MMT:     MMT Right eval Left eval  Hip flexion 4 3+ *  Hip abduction (modified sitting) 5 5  Hip internal rotation      Hip external rotation      Knee flexion 4 4  Knee extension 4 4    (Blank rows = not tested) (Key: WFL = within functional limits not formally assessed, * = concordant pain, s = stiffness/stretching sensation, NT = not tested)  Comments: hip rotation MMT deferred due to symptom irritability     FUNCTIONAL TESTS:  30sec STS 4.5 repetitions heavy UE support and increasing back pain    GAIT: Distance walked: with clinic Assistive device utilized: None Level of assistance: Complete  Independence Comments: wide BOS, reduced step length, reduced trunk rotation B, antalgic gait on L    TODAY'S TREATMENT:  [place] Adult PT Treatment:                                                DATE: 10/10/22 Therapeutic Exercise: Nustep L4 UE/LE  Seated physio ball rollouts - x 5  Hooklying clam with red band x 5 -c/o increased pain pain SKTC with towel assist 10 sec x 3 each  Supine gluteal activation  Left hip flexor dangle, pain with return to table  Butterfly stretch, gently Ball squeeze x 10 LTR  Small PPT  [facility] Adult PT Treatment:                                                DATE: 10/06/22 Therapeutic Exercise: Lumbar AROM Bilat hip PROM Bilat hamstring PROM , right 90 , left 75 LTR SKTC - painful actively- improved with use of towel  Tra activation with ball squeeze Tra activation with  small red band clam Supine gluteal sets     [facility] Adult PT Treatment:                                                DATE: EVAL deferred given time constraints   PATIENT EDUCATION:  Education details: Pt education on PT impairments, prognosis, and POC. Informed consent. Rationale for intervention  Person educated: Patient Education method: Explanation, Demonstration, Tactile cues, Verbal cues Education comprehension: verbalized understanding, returned demonstration, verbal cues required, tactile cues required, and needs further education     HOME EXERCISE PROGRAM: Deferred given time constraints Access Code: [email or web address] URL: [email or web address] Date: 10/06/2022 Prepared by: Hessie Diener  Exercises - knee to chest- use towel to assist  - 1 x daily - 7 x weekly - 1 sets - 3 reps - 5-10 hold - Supine Lower Trunk Rotation  - 1 x daily - 7 x weekly - 1 sets - 10 reps - Supine Gluteal Sets  - 1 x daily - 7 x weekly - 2 sets - 10 reps - 5 hold -  Supine Hip Adduction Isometric with Ball  - 1 x daily - 7 x weekly - 2 sets - 10 reps - 5 hold - Hooklying Clamshell with Resistance  - 2 x daily - 7 x weekly - 1 sets - 10 reps - 5 hold    ASSESSMENT:   CLINICAL IMPRESSION: Patient is a 57 y.o. woman who was seen today for physical therapy treatment for low back pain after sustaining a fall in December. She reports min soreness after first treatment but overall she did well. She reports compliance with initial HEP and positive response. Began Nustep and reviewed intial HEP. She requires rest breaks and demonstrates guarded movements with therex. Added anterior hip stretches with pain during hip flexor stretch. Encouraged gentle movements with all therex. She also has pain in lumbar spine. At end of session she reports "I feel fine right now." Will continue with gently mobility.     OBJECTIVE IMPAIRMENTS: Abnormal gait, decreased activity tolerance, decreased endurance, decreased mobility, difficulty walking, decreased ROM, decreased strength, impaired perceived functional ability, improper body mechanics, postural dysfunction, and pain.  ACTIVITY LIMITATIONS: carrying, lifting, bending, sitting, standing, squatting, sleeping, stairs, transfers, and locomotion level   PARTICIPATION LIMITATIONS: meal prep, cleaning, laundry, driving, community activity, and occupation   PERSONAL FACTORS: 3+ comorbidities: HTN, CHF, CVA, OSA, DM2, multiple MSK issues, depression  are also affecting patient's functional outcome.    REHAB POTENTIAL: Fair given comorbidities and severity of symptoms   CLINICAL DECISION MAKING: Evolving/moderate complexity   EVALUATION COMPLEXITY: Moderate     GOALS: Goals reviewed with patient? No given time constraints    SHORT TERM GOALS: Target date: 10/21/2022 Pt will demonstrate appropriate understanding and performance of initially prescribed HEP in order to facilitate improved independence with management of symptoms.   Baseline: HEP provided on eval Goal status: INITIAL    2. Pt will score less than or equal to 38% on ODI in order to demonstrate improved perception of function due to symptoms.            Baseline: 44%            Goal status: INITIAL     LONG TERM GOALS: Target date: 11/11/2022 Pt will score less than or equal to 30% on ODI  in order to demonstrate improved perception of functional status due to symptoms.  Baseline: 44% Goal status: INITIAL   2.  Pt will report ability to stand/walk for up to 39min with less than 2pt increase in resting pain in order to improve functional tolerance.  Baseline: up to 10/10 pain with functional mobility Goal status: INITIAL   3.  Pt will demonstrate hip flexion MMT of 4/5 bilaterally in order to demonstrate improved strength for functional movements.  Baseline: see MMT chart above Goal status: INITIAL   4. Pt will perform >8 repetitions for 30sec STS in order to demonstrate reduced fall risk and improved functional independence. (MCID of 2 repetitions, fall risk cutoff score 12-17 reps)            Baseline: 4.5 repetition            Goal status: INITIAL    5. Pt will demonstrate ability to lift up to 10# with less than 4/10 pain on NPS in order to demonstrate improved tolerance to household activities.             Baseline: NT due to symptom irritability, pt reports up to 10/10 pain with lifting            Goal status: INITIAL   PLAN:   PT FREQUENCY: 1-2x/week   PT DURATION: 6 weeks   PLANNED INTERVENTIONS: Therapeutic exercises, Therapeutic activity, Neuromuscular re-education, Balance training, Gait training, Patient/Family education, Self Care, Joint mobilization, Stair training, Aquatic Therapy, Dry Needling, Electrical stimulation, Spinal mobilization, Cryotherapy, Moist heat, Taping, Manual therapy, and Re-evaluation.   PLAN FOR NEXT SESSION: assess response to initial HEP;  Look at lumbar ROM and L hip joint. Establish HEP, anticipate basic  lumbopelvic activation exercises, consider standing hip flexor stretch or standing hip AROM.      Hessie Diener, PTA 10/10/22 8:07 AM Phone: [phone] Fax: [phone]

## 2022-10-13 ENCOUNTER — Encounter: Payer: Self-pay | Admitting: Physical Therapy

## 2022-10-13 ENCOUNTER — Ambulatory Visit: Payer: Medicaid Other | Admitting: Physical Therapy

## 2022-10-13 DIAGNOSIS — M5459 Other low back pain: Secondary | ICD-10-CM | POA: Diagnosis not present

## 2022-10-13 DIAGNOSIS — M6281 Muscle weakness (generalized): Secondary | ICD-10-CM

## 2022-10-13 DIAGNOSIS — R2689 Other abnormalities of gait and mobility: Secondary | ICD-10-CM

## 2022-10-13 DIAGNOSIS — M25552 Pain in left hip: Secondary | ICD-10-CM

## 2022-10-13 NOTE — Therapy (Signed)
OUTPATIENT PHYSICAL THERAPY TREATMENT NOTE   Patient Name: Cassie Mata MRN: [id number] DOB:07/05/1966, 57 y.o., female Today's Date: 10/13/2022  PCP: Deborah Chalk, FNP   REFERRING PROVIDER: Pedro Earls, MD  END OF SESSION:   PT End of Session - 10/13/22 0926     Visit Number 4    Number of Visits 13    Date for PT Re-Evaluation 11/25/22    Authorization Type UHC MCD    Authorization - Visit Number 4    Authorization - Number of Visits 27   annual VL   PT Start Time 0930    PT Stop Time 1010    PT Time Calculation (min) 40 min    Activity Tolerance Patient tolerated treatment well;Patient limited by pain    Behavior During Therapy [facility] for tasks assessed/performed              Past Medical History:  Diagnosis Date   Arthritis    Asthma    Cataract    NS OU   CHF (congestive heart failure) ([place])    Coma ([place])    Depression    Diabetes mellitus    Diabetic retinopathy ([place])    OU   Fibromyalgia    Gall stone    Gastroesophageal reflux disease    Hiatal hernia    Insomnia 08/07/2014   Myalgia and myositis, unspecified 01/13/2013   Stroke ([place]) 2007   Pt. evaluated at [facility] had left sided weakness.  Reportedly no cerebral imaging ever done.  Records we have received from [facility] dated 2011 do not document this.  Rest of records in storage and have not yet sent.   Past Surgical History:  Procedure Laterality Date   CESAREAN SECTION  [phone]   INNER EAR SURGERY     TUBAL LIGATION     Patient Active Problem List   Diagnosis Date Noted   Moderate persistent asthma without complication 16/96/7893   Seasonal and perennial allergic rhinitis 04/24/2022   Allergic conjunctivitis of both eyes 04/24/2022   Osteoarthritis of hand 06/24/2021   Pain in left hand 06/04/2021   Paget's bone disease 06/04/2021   Constipation    Vomiting in adult 03/22/2021   AKI (acute kidney injury) ([place]) 03/22/2021   Metacarpal bone  fracture 03/13/2021   Cerebrovascular accident (CVA) ([place]) 81/09/7508   Chronic systolic congestive heart failure ([place]) 11/08/2020   OSA (obstructive sleep apnea) 11/08/2020   Tobacco abuse, in remission 11/08/2020   Diabetic cataract of both eyes ([place]) 10/18/2020   Proliferative diabetic retinopathy of both eyes with macular edema associated with type 2 diabetes mellitus ([place]) 10/18/2020   Vitamin D deficiency 12/20/2019   Diabetes mellitus type II, uncontrolled 08/08/2015   Essential hypertension 08/08/2015   GERD (gastroesophageal reflux disease) 08/08/2015   Chronic low back pain with sciatica 08/08/2015   Arthritis 08/08/2015   Type 2 diabetes mellitus ([place]) 08/08/2015   Depression 06/15/2015   Insomnia 08/07/2014   Myalgia and myositis 01/13/2013    REFERRING DIAG: M54.50 (ICD-10-CM) - Low back pain, unspecified  THERAPY DIAG:  Other low back pain  Pain in left hip  Muscle weakness (generalized)  Other abnormalities of gait and mobility  Rationale for Evaluation and Treatment Rehabilitation  PERTINENT HISTORY:  HTN, CHF, CVA, OSA, GERD, DM2, multiple MSK issues, depression, CKD3, fibromyalgia  PRECAUTIONS: fall risk, cardiac hx  SUBJECTIVE:  SUBJECTIVE STATEMENT:    Pt arrives w/ 4/10 pain, denies any issues after last session. Reports good adherence with HEP, states her tolerance is slowly getting better.   PAIN:  Are you having pain: yes 4/10 Location: low back and L groin How would you describe your pain? Dull, aching Best in past week: 3-4/10 (5/10 on eval) Worst in past week: 10/10 Aggravating factors: picking up from floor, stairs (going up especially), transfers, prolonged standing/walking, sitting 10-30 min, lying on left side Easing factors: changing positions, self massage,  pain cream   OBJECTIVE: (objective measures completed at initial evaluation unless otherwise dated)   DIAGNOSTIC FINDINGS:  No recent spine imaging in EPIC - pt states she had imaging of back and L hip after fall, states she thinks it showed a spur in her hip, states she does not think it showed any fracture.    PATIENT SURVEYS:  Modified oswestry: 22/50 raw, 44%    SCREENING FOR RED FLAGS: Unremarkable red flag screening   COGNITION: Overall cognitive status: Within functional limits for tasks assessed                          SENSATION/NEURO: Light touch intact BLE No clonus No ataxia with gait     POSTURE: fwd head, rounded shoulders, reduced lordosis    PALPATION: Unable to test due to time constraints   LUMBAR ROM:    AROM eval 10/06/22  Flexion   Reaches mid shin , pain  Extension   10 most  painful   Right lateral flexion   Reach mid knee , pain  Left lateral flexion   Reaches 1 inch past knee, pain  Right rotation (seated) 75% Standing 50% pain  Left rotation (seated) 75% Standing 75%   (Blank rows = not tested)   LOWER EXTREMITY ROM:      Passive  Right eval Left eval Right 10/06/22 Left 10/06/22  Hip flexion     [facility]  Hip extension        Hip internal rotation     20 25  Hip external rotation     45  40 pain  10/06/22: Hamstring length: R 90, L 75 from hooklying  (Blank rows = not tested) (Key: WFL = within functional limits not formally assessed, * = concordant pain, s = stiffness/stretching sensation, NT = not tested)  Comments:     LOWER EXTREMITY MMT:     MMT Right eval Left eval  Hip flexion 4 3+ *  Hip abduction (modified sitting) 5 5  Hip internal rotation      Hip external rotation      Knee flexion 4 4  Knee extension 4 4    (Blank rows = not tested) (Key: WFL = within functional limits not formally assessed, * = concordant pain, s = stiffness/stretching sensation, NT = not tested)  Comments: hip rotation MMT deferred due to  symptom irritability     FUNCTIONAL TESTS:  30sec STS 4.5 repetitions heavy UE support and increasing back pain    GAIT: Distance walked: with clinic Assistive device utilized: None Level of assistance: Complete Independence Comments: wide BOS, reduced step length, reduced trunk rotation B, antalgic gait on L    TODAY'S TREATMENT:  [place] Adult PT Treatment:                                                DATE: 10/13/22 Therapeutic Exercise: Nu step UE/LE 80min during subjective Seated swiss ball fwd rollout 2x5 cues for appropriate ROM and breath control  Seated swiss ball iso press down 2x10 cues for form, pacing, and breath control  Seated adductor iso 2x10 cues for breath control and posture  Seated RTB abduction 3x5 cues for form/setup Standing march x5 each LE, at counter for UE support, cues for posture and control HEP review                     [facility] Adult PT Treatment:                                                DATE: 10/10/22 Therapeutic Exercise: Nustep L4 UE/LE  Seated physio ball rollouts - x 5  Hooklying clam with red band x 5 -c/o increased pain pain SKTC with towel assist 10 sec x 3 each  Supine gluteal activation  Left hip flexor dangle, pain with return to table  Butterfly stretch, gently Ball squeeze x 10 LTR  Small PPT  [facility] Adult PT Treatment:                                                DATE: 10/06/22 Therapeutic Exercise: Lumbar AROM Bilat hip PROM Bilat hamstring PROM , right 90 , left 75 LTR SKTC - painful actively- improved with use of towel  Tra activation with ball squeeze Tra activation with  small red band clam Supine gluteal sets  PATIENT EDUCATION:  Education details: rationale for interventions  Person educated: Patient Education method: Explanation, Demonstration, Tactile cues, Verbal cues Education comprehension: verbalized understanding,  returned demonstration, verbal cues required, tactile cues required, and needs further education     HOME EXERCISE PROGRAM: Deferred given time constraints Access Code: [email or web address] URL: [email or web address] Date: 10/06/2022 Prepared by: Hessie Diener  Exercises - knee to chest- use towel to assist  - 1 x daily - 7 x weekly - 1 sets - 3 reps - 5-10 hold - Supine Lower Trunk Rotation  - 1 x daily - 7 x weekly - 1 sets - 10 reps - Supine Gluteal Sets  - 1 x daily - 7 x weekly - 2 sets - 10 reps - 5 hold - Supine Hip Adduction Isometric with Ball  - 1 x daily - 7 x weekly - 2 sets - 10 reps - 5 hold - Hooklying Clamshell with Resistance  - 2 x daily - 7 x weekly - 1 sets - 10 reps - 5 hold    ASSESSMENT:   CLINICAL IMPRESSION: Pt arrives w/ 4/10 pain on NPS, no issues after last session, states symptoms continue to fluctuate. Today's session focusing on lumbopelvic activation within pt tolerance, requires rest breaks due to discomfort/fatigue. Pt reports some symptom irritability w/ stretching that improves with activation exercises, continues to report moderate pain levels overall. Most difficulty w/ lumbar flexion and hip flexion  activities. No adverse events. Pt departs today's session in no acute distress, all voiced questions/concerns addressed appropriately from PT perspective.      OBJECTIVE IMPAIRMENTS: Abnormal gait, decreased activity tolerance, decreased endurance, decreased mobility, difficulty walking, decreased ROM, decreased strength, impaired perceived functional ability, improper body mechanics, postural dysfunction, and pain.    ACTIVITY LIMITATIONS: carrying, lifting, bending, sitting, standing, squatting, sleeping, stairs, transfers, and locomotion level   PARTICIPATION LIMITATIONS: meal prep, cleaning, laundry, driving, community activity, and occupation   PERSONAL FACTORS: 3+ comorbidities: HTN, CHF, CVA, OSA, DM2, multiple MSK issues, depression  are also  affecting patient's functional outcome.    REHAB POTENTIAL: Fair given comorbidities and severity of symptoms   CLINICAL DECISION MAKING: Evolving/moderate complexity   EVALUATION COMPLEXITY: Moderate     GOALS: Goals reviewed with patient? No given time constraints    SHORT TERM GOALS: Target date: 10/21/2022 Pt will demonstrate appropriate understanding and performance of initially prescribed HEP in order to facilitate improved independence with management of symptoms.  Baseline: HEP provided on eval Goal status: INITIAL    2. Pt will score less than or equal to 38% on ODI in order to demonstrate improved perception of function due to symptoms.            Baseline: 44%            Goal status: INITIAL     LONG TERM GOALS: Target date: 11/11/2022 Pt will score less than or equal to 30% on ODI  in order to demonstrate improved perception of functional status due to symptoms.  Baseline: 44% Goal status: INITIAL   2.  Pt will report ability to stand/walk for up to 53min with less than 2pt increase in resting pain in order to improve functional tolerance.  Baseline: up to 10/10 pain with functional mobility Goal status: INITIAL   3.  Pt will demonstrate hip flexion MMT of 4/5 bilaterally in order to demonstrate improved strength for functional movements.  Baseline: see MMT chart above Goal status: INITIAL   4. Pt will perform >8 repetitions for 30sec STS in order to demonstrate reduced fall risk and improved functional independence. (MCID of 2 repetitions, fall risk cutoff score 12-17 reps)            Baseline: 4.5 repetition            Goal status: INITIAL    5. Pt will demonstrate ability to lift up to 10# with less than 4/10 pain on NPS in order to demonstrate improved tolerance to household activities.             Baseline: NT due to symptom irritability, pt reports up to 10/10 pain with lifting            Goal status: INITIAL   PLAN:   PT FREQUENCY: 1-2x/week   PT  DURATION: 6 weeks   PLANNED INTERVENTIONS: Therapeutic exercises, Therapeutic activity, Neuromuscular re-education, Balance training, Gait training, Patient/Family education, Self Care, Joint mobilization, Stair training, Aquatic Therapy, Dry Needling, Electrical stimulation, Spinal mobilization, Cryotherapy, Moist heat, Taping, Manual therapy, and Re-evaluation.   PLAN FOR NEXT SESSION: review/update HEP, continue lumbopelvic activation exercises. Hip flexion isos as tolerated     Leeroy Cha PT, DPT 10/13/2022 10:12 AM

## 2022-10-16 NOTE — Therapy (Signed)
OUTPATIENT PHYSICAL THERAPY TREATMENT NOTE   Patient Name: TRANELL TUELL MRN: [id number] DOB:1966-04-05, 57 y.o., female Today's Date: 10/17/2022  PCP: Deborah Chalk, FNP   REFERRING PROVIDER: Pedro Earls, MD  END OF SESSION:   PT End of Session - 10/17/22 0850     Visit Number 5    Number of Visits 13    Date for PT Re-Evaluation 11/25/22    Authorization Type [employer] MCD    Authorization Time Period no auth    Authorization - Visit Number 5    Authorization - Number of Visits 27   annual VL   PT Start Time [phone]   late check in   PT Stop Time 0929    PT Time Calculation (min) 38 min    Activity Tolerance Patient tolerated treatment well;Patient limited by pain    Behavior During Therapy [facility] for tasks assessed/performed               Past Medical History:  Diagnosis Date   Arthritis    Asthma    Cataract    NS OU   CHF (congestive heart failure) ([place])    Coma ([place])    Depression    Diabetes mellitus    Diabetic retinopathy ([place])    OU   Fibromyalgia    Gall stone    Gastroesophageal reflux disease    Hiatal hernia    Insomnia 08/07/2014   Myalgia and myositis, unspecified 01/13/2013   Stroke ([place]) 2007   Pt. evaluated at [facility] had left sided weakness.  Reportedly no cerebral imaging ever done.  Records we have received from [facility] dated 2011 do not document this.  Rest of records in storage and have not yet sent.   Past Surgical History:  Procedure Laterality Date   CESAREAN SECTION  [phone]   INNER EAR SURGERY     TUBAL LIGATION     Patient Active Problem List   Diagnosis Date Noted   Moderate persistent asthma without complication 123456   Seasonal and perennial allergic rhinitis 04/24/2022   Allergic conjunctivitis of both eyes 04/24/2022   Osteoarthritis of hand 06/24/2021   Pain in left hand 06/04/2021   Paget's bone disease 06/04/2021   Constipation    Vomiting in adult 03/22/2021   AKI (acute  kidney injury) ([employer]) 03/22/2021   Metacarpal bone fracture 03/13/2021   Cerebrovascular accident (CVA) ([place]) [id number]   Chronic systolic congestive heart failure ([place]) 11/08/2020   OSA (obstructive sleep apnea) 11/08/2020   Tobacco abuse, in remission 11/08/2020   Diabetic cataract of both eyes ([place]) 10/18/2020   Proliferative diabetic retinopathy of both eyes with macular edema associated with type 2 diabetes mellitus ([place]) 10/18/2020   Vitamin D deficiency 12/20/2019   Diabetes mellitus type II, uncontrolled 08/08/2015   Essential hypertension 08/08/2015   GERD (gastroesophageal reflux disease) 08/08/2015   Chronic low back pain with sciatica 08/08/2015   Arthritis 08/08/2015   Type 2 diabetes mellitus ([place]) 08/08/2015   Depression 06/15/2015   Insomnia 08/07/2014   Myalgia and myositis 01/13/2013    REFERRING DIAG: M54.50 (ICD-10-CM) - Low back pain, unspecified  THERAPY DIAG:  Other low back pain  Pain in left hip  Muscle weakness (generalized)  Other abnormalities of gait and mobility  Rationale for Evaluation and Treatment Rehabilitation  PERTINENT HISTORY:  HTN, CHF, CVA, OSA, GERD, DM2, multiple MSK issues, depression, CKD3, fibromyalgia  PRECAUTIONS: fall risk, cardiac hx  SUBJECTIVE:  SUBJECTIVE STATEMENT:   Pt arrives w/ 6.5/10 pain, states her hip has been a bit more aggravated this morning. A little soreness after last session that cleared up quickly. Reports good HEP adherence, states exercises are getting a little easier but symptoms remain irritable overall PAIN:  Are you having pain: yes 6.5/10 Location: low back and L groin How would you describe your pain? Dull, aching Best in past week: 3-4/10 (5/10 on eval) Worst in past week: 10/10 Aggravating factors: picking up from  floor, stairs (going up especially), transfers, prolonged standing/walking, sitting 10-30 min, lying on left side Easing factors: changing positions, self massage, pain cream   OBJECTIVE: (objective measures completed at initial evaluation unless otherwise dated)   DIAGNOSTIC FINDINGS:  No recent spine imaging in EPIC - pt states she had imaging of back and L hip after fall, states she thinks it showed a spur in her hip, states she does not think it showed any fracture.    PATIENT SURVEYS:  Modified oswestry: 22/50 raw, 44%    SCREENING FOR RED FLAGS: Unremarkable red flag screening   COGNITION: Overall cognitive status: Within functional limits for tasks assessed                          SENSATION/NEURO: Light touch intact BLE No clonus No ataxia with gait     POSTURE: fwd head, rounded shoulders, reduced lordosis    PALPATION: Unable to test due to time constraints   LUMBAR ROM:    AROM eval 10/06/22  Flexion   Reaches mid shin , pain  Extension   10 most  painful   Right lateral flexion   Reach mid knee , pain  Left lateral flexion   Reaches 1 inch past knee, pain  Right rotation (seated) 75% Standing 50% pain  Left rotation (seated) 75% Standing 75%   (Blank rows = not tested)   LOWER EXTREMITY ROM:      Passive  Right eval Left eval Right 10/06/22 Left 10/06/22  Hip flexion     [facility]  Hip extension        Hip internal rotation     20 25  Hip external rotation     45  40 pain  10/06/22: Hamstring length: R 90, L 75 from hooklying  (Blank rows = not tested) (Key: WFL = within functional limits not formally assessed, * = concordant pain, s = stiffness/stretching sensation, NT = not tested)  Comments:     LOWER EXTREMITY MMT:     MMT Right eval Left eval  Hip flexion 4 3+ *  Hip abduction (modified sitting) 5 5  Hip internal rotation      Hip external rotation      Knee flexion 4 4  Knee extension 4 4    (Blank rows = not tested) (Key: WFL =  within functional limits not formally assessed, * = concordant pain, s = stiffness/stretching sensation, NT = not tested)  Comments: hip rotation MMT deferred due to symptom irritability     FUNCTIONAL TESTS:  30sec STS 4.5 repetitions heavy UE support and increasing back pain    GAIT: Distance walked: with clinic Assistive device utilized: None Level of assistance: Complete Independence Comments: wide BOS, reduced step length, reduced trunk rotation B, antalgic gait on L    TODAY'S TREATMENT:  [facility] Adult PT Treatment:                                                DATE: 10/17/22 Therapeutic Exercise: Nustep L5 UE/LE 33mn during subjective Sciatic nerve glide 2x10 cues for form and pacing  Hip flexion isos self resisted 4x5 LLE only cues for appropriate force and effort  Seated adductor iso 2x15 cues for form and breath control Seated thoracolumbar extension x10 cues for form and appropriate ROM  Standing hip extension x5 each LE    [facility] Adult PT Treatment:                                                DATE: 10/13/22 Therapeutic Exercise: Nu step UE/LE 570m during subjective Seated swiss ball fwd rollout 2x5 cues for appropriate ROM and breath control  Seated swiss ball iso press down 2x10 cues for form, pacing, and breath control  Seated adductor iso 2x10 cues for breath control and posture  Seated RTB abduction 3x5 cues for form/setup Standing march x5 each LE, at counter for UE support, cues for posture and control HEP review                     [facility] Adult PT Treatment:                                                DATE: 10/10/22 Therapeutic Exercise: Nustep L4 UE/LE  Seated physio ball rollouts - x 5  Hooklying clam with red band x 5 -c/o increased pain pain SKTC with towel assist 10 sec x 3 each  Supine gluteal activation  Left hip flexor dangle, pain with return to table  Butterfly  stretch, gently Ball squeeze x 10 LTR  Small PPT  PATIENT EDUCATION:  Education details: rationale for interventions  Person educated: Patient Education method: Explanation, Demonstration, Tactile cues, Verbal cues Education comprehension: verbalized understanding, returned demonstration, verbal cues required, tactile cues required, and needs further education     HOME EXERCISE PROGRAM: Deferred given time constraints Access Code: [email or web address]: [email or web address] Date: 10/06/2022 Prepared by: JeHessie DienerExercises - knee to chest- use towel to assist  - 1 x daily - 7 x weekly - 1 sets - 3 reps - 5-10 hold - Supine Lower Trunk Rotation  - 1 x daily - 7 x weekly - 1 sets - 10 reps - Supine Gluteal Sets  - 1 x daily - 7 x weekly - 2 sets - 10 reps - 5 hold - Supine Hip Adduction Isometric with Ball  - 1 x daily - 7 x weekly - 2 sets - 10 reps - 5 hold - Hooklying Clamshell with Resistance  - 2 x daily - 7 x weekly - 1 sets - 10 reps - 5 hold    ASSESSMENT:   CLINICAL IMPRESSION: 10/17/2022 Pt arrives w/ report of 6.5/10 hip pain, no issues after last session. Pt with increased symptoms after nu step that improves to baseline after seated sciatic nerve glides. Introduced hip flexion isos today with improved tolerance compared to  previous sessions for hip flexion activities although does fatigue quickly. Progression limited by symptom irritability, remains limited with both lumbar and hip movements. No adverse events, some transient increases in discomfort with activity. Continues to demo limitations in hip strength/mobility and weightbearing tolerance, recommend skilled PT to address. Pt departs today's session in no acute distress, all voiced questions/concerns addressed appropriately from PT perspective.       OBJECTIVE IMPAIRMENTS: Abnormal gait, decreased activity tolerance, decreased endurance, decreased mobility, difficulty walking, decreased ROM, decreased  strength, impaired perceived functional ability, improper body mechanics, postural dysfunction, and pain.    ACTIVITY LIMITATIONS: carrying, lifting, bending, sitting, standing, squatting, sleeping, stairs, transfers, and locomotion level   PARTICIPATION LIMITATIONS: meal prep, cleaning, laundry, driving, community activity, and occupation   PERSONAL FACTORS: 3+ comorbidities: HTN, CHF, CVA, OSA, DM2, multiple MSK issues, depression  are also affecting patient's functional outcome.    REHAB POTENTIAL: Fair given comorbidities and severity of symptoms   CLINICAL DECISION MAKING: Evolving/moderate complexity   EVALUATION COMPLEXITY: Moderate     GOALS: Goals reviewed with patient? No given time constraints    SHORT TERM GOALS: Target date: 10/21/2022 Pt will demonstrate appropriate understanding and performance of initially prescribed HEP in order to facilitate improved independence with management of symptoms.  Baseline: HEP provided on eval Goal status: INITIAL    2. Pt will score less than or equal to 38% on ODI in order to demonstrate improved perception of function due to symptoms.            Baseline: 44%            Goal status: INITIAL     LONG TERM GOALS: Target date: 11/11/2022 Pt will score less than or equal to 30% on ODI  in order to demonstrate improved perception of functional status due to symptoms.  Baseline: 44% Goal status: INITIAL   2.  Pt will report ability to stand/walk for up to 22mn with less than 2pt increase in resting pain in order to improve functional tolerance.  Baseline: up to 10/10 pain with functional mobility Goal status: INITIAL   3.  Pt will demonstrate hip flexion MMT of 4/5 bilaterally in order to demonstrate improved strength for functional movements.  Baseline: see MMT chart above Goal status: INITIAL   4. Pt will perform >8 repetitions for 30sec STS in order to demonstrate reduced fall risk and improved functional independence. (MCID of 2  repetitions, fall risk cutoff score 12-17 reps)            Baseline: 4.5 repetition            Goal status: INITIAL    5. Pt will demonstrate ability to lift up to 10# with less than 4/10 pain on NPS in order to demonstrate improved tolerance to household activities.             Baseline: NT due to symptom irritability, pt reports up to 10/10 pain with lifting            Goal status: INITIAL   PLAN:   PT FREQUENCY: 1-2x/week   PT DURATION: 6 weeks   PLANNED INTERVENTIONS: Therapeutic exercises, Therapeutic activity, Neuromuscular re-education, Balance training, Gait training, Patient/Family education, Self Care, Joint mobilization, Stair training, Aquatic Therapy, Dry Needling, Electrical stimulation, Spinal mobilization, Cryotherapy, Moist heat, Taping, Manual therapy, and Re-evaluation.   PLAN FOR NEXT SESSION: sciatic nerve glides and hip flexion isos. Otherwise continue progression of lumbopelvic stability as tolerated     DRayvon Char  Gottlieb Zuercher PT, DPT 10/17/2022 12:02 PM

## 2022-10-17 ENCOUNTER — Ambulatory Visit: Payer: Medicaid Other | Admitting: Physical Therapy

## 2022-10-17 ENCOUNTER — Encounter: Payer: Self-pay | Admitting: Physical Therapy

## 2022-10-17 DIAGNOSIS — R2689 Other abnormalities of gait and mobility: Secondary | ICD-10-CM

## 2022-10-17 DIAGNOSIS — M5459 Other low back pain: Secondary | ICD-10-CM | POA: Diagnosis not present

## 2022-10-17 DIAGNOSIS — M25552 Pain in left hip: Secondary | ICD-10-CM

## 2022-10-17 DIAGNOSIS — M6281 Muscle weakness (generalized): Secondary | ICD-10-CM

## 2022-10-17 NOTE — Therapy (Signed)
OUTPATIENT PHYSICAL THERAPY TREATMENT NOTE   Patient Name: Cassie Mata MRN: [id number] DOB:27-Jul-1966, 57 y.o., female Today's Date: 10/17/2022  PCP: Deborah Chalk, FNP   REFERRING PROVIDER: Pedro Earls, MD  END OF SESSION:       Past Medical History:  Diagnosis Date   Arthritis    Asthma    Cataract    NS OU   CHF (congestive heart failure) ([place])    Coma ([place])    Depression    Diabetes mellitus    Diabetic retinopathy ([place])    OU   Fibromyalgia    Gall stone    Gastroesophageal reflux disease    Hiatal hernia    Insomnia 08/07/2014   Myalgia and myositis, unspecified 01/13/2013   Stroke ([place]) 2007   Pt. evaluated at [facility] had left sided weakness.  Reportedly no cerebral imaging ever done.  Records we have received from [facility] dated 2011 do not document this.  Rest of records in storage and have not yet sent.   Past Surgical History:  Procedure Laterality Date   CESAREAN SECTION  [phone]   INNER EAR SURGERY     TUBAL LIGATION     Patient Active Problem List   Diagnosis Date Noted   Moderate persistent asthma without complication 123456   Seasonal and perennial allergic rhinitis 04/24/2022   Allergic conjunctivitis of both eyes 04/24/2022   Osteoarthritis of hand 06/24/2021   Pain in left hand 06/04/2021   Paget's bone disease 06/04/2021   Constipation    Vomiting in adult 03/22/2021   AKI (acute kidney injury) ([place]) 03/22/2021   Metacarpal bone fracture 03/13/2021   Cerebrovascular accident (CVA) ([place]) [id number]   Chronic systolic congestive heart failure ([place]) 11/08/2020   OSA (obstructive sleep apnea) 11/08/2020   Tobacco abuse, in remission 11/08/2020   Diabetic cataract of both eyes ([place]) 10/18/2020   Proliferative diabetic retinopathy of both eyes with macular edema associated with type 2 diabetes mellitus ([place]) 10/18/2020   Vitamin D deficiency 12/20/2019   Diabetes mellitus type II, uncontrolled  08/08/2015   Essential hypertension 08/08/2015   GERD (gastroesophageal reflux disease) 08/08/2015   Chronic low back pain with sciatica 08/08/2015   Arthritis 08/08/2015   Type 2 diabetes mellitus ([place]) 08/08/2015   Depression 06/15/2015   Insomnia 08/07/2014   Myalgia and myositis 01/13/2013    REFERRING DIAG: M54.50 (ICD-10-CM) - Low back pain, unspecified  THERAPY DIAG:  No diagnosis found.  Rationale for Evaluation and Treatment Rehabilitation  PERTINENT HISTORY:  HTN, CHF, CVA, OSA, GERD, DM2, multiple MSK issues, depression, CKD3, fibromyalgia  PRECAUTIONS: fall risk, cardiac hx  SUBJECTIVE:  SUBJECTIVE STATEMENT:   ***  *** Pt arrives w/ 6.5/10 pain, states her hip has been a bit more aggravated this morning. A little soreness after last session that cleared up quickly. Reports good HEP adherence, states exercises are getting a little easier but symptoms remain irritable overall PAIN:  Are you having pain: yes 6.5/10 Location: low back and L groin How would you describe your pain? Dull, aching Best in past week: 3-4/10 (5/10 on eval) Worst in past week: 10/10 Aggravating factors: picking up from floor, stairs (going up especially), transfers, prolonged standing/walking, sitting 10-30 min, lying on left side Easing factors: changing positions, self massage, pain cream   OBJECTIVE: (objective measures completed at initial evaluation unless otherwise dated)   DIAGNOSTIC FINDINGS:  No recent spine imaging in EPIC - pt states she had imaging of back and L hip after fall, states she thinks it showed a spur in her hip, states she does not think it showed any fracture.    PATIENT SURVEYS:  Modified oswestry: 22/50 raw, 44%    SCREENING FOR RED FLAGS: Unremarkable red flag screening    COGNITION: Overall cognitive status: Within functional limits for tasks assessed                          SENSATION/NEURO: Light touch intact BLE No clonus No ataxia with gait     POSTURE: fwd head, rounded shoulders, reduced lordosis    PALPATION: Unable to test due to time constraints   LUMBAR ROM:    AROM eval 10/06/22  Flexion   Reaches mid shin , pain  Extension   10 most  painful   Right lateral flexion   Reach mid knee , pain  Left lateral flexion   Reaches 1 inch past knee, pain  Right rotation (seated) 75% Standing 50% pain  Left rotation (seated) 75% Standing 75%   (Blank rows = not tested)   LOWER EXTREMITY ROM:      Passive  Right eval Left eval Right 10/06/22 Left 10/06/22  Hip flexion     [facility]  Hip extension        Hip internal rotation     20 25  Hip external rotation     45  40 pain  10/06/22: Hamstring length: R 90, L 75 from hooklying  (Blank rows = not tested) (Key: WFL = within functional limits not formally assessed, * = concordant pain, s = stiffness/stretching sensation, NT = not tested)  Comments:     LOWER EXTREMITY MMT:     MMT Right eval Left eval  Hip flexion 4 3+ *  Hip abduction (modified sitting) 5 5  Hip internal rotation      Hip external rotation      Knee flexion 4 4  Knee extension 4 4    (Blank rows = not tested) (Key: WFL = within functional limits not formally assessed, * = concordant pain, s = stiffness/stretching sensation, NT = not tested)  Comments: hip rotation MMT deferred due to symptom irritability     FUNCTIONAL TESTS:  30sec STS 4.5 repetitions heavy UE support and increasing back pain    GAIT: Distance walked: with clinic Assistive device utilized: None Level of assistance: Complete Independence Comments: wide BOS, reduced step length, reduced trunk rotation B, antalgic gait on L    TODAY'S TREATMENT:  [facility] Adult PT Treatment:                                                DATE: 10/20/22 Therapeutic Exercise: *** Manual Therapy: *** Neuromuscular re-ed: *** Therapeutic Activity: *** Modalities: *** Self Care: ***   [facility] Adult PT Treatment:                                                DATE: 10/17/22 Therapeutic Exercise: Nustep L5 UE/LE 30mn during subjective Sciatic nerve glide 2x10 cues for form and pacing  Hip flexion isos self resisted 4x5 LLE only cues for appropriate force and effort  Seated adductor iso 2x15 cues for form and breath control Seated thoracolumbar extension x10 cues for form and appropriate ROM  Standing hip extension x5 each LE    [facility] Adult PT Treatment:                                                DATE: 10/13/22 Therapeutic Exercise: Nu step UE/LE 51m during subjective Seated swiss ball fwd rollout 2x5 cues for appropriate ROM and breath control  Seated swiss ball iso press down 2x10 cues for form, pacing, and breath control  Seated adductor iso 2x10 cues for breath control and posture  Seated RTB abduction 3x5 cues for form/setup Standing march x5 each LE, at counter for UE support, cues for posture and control HEP review    PATIENT EDUCATION:  Education details: rationale for interventions  Person educated: Patient Education method: Explanation, Demonstration, Tactile cues, Verbal cues Education comprehension: verbalized understanding, returned demonstration, verbal cues required, tactile cues required, and needs further education     HOME EXERCISE PROGRAM: Deferred given time constraints Access Code: [email or web address]: [email or web address] Date: 10/06/2022 Prepared by: JeHessie DienerExercises - knee to chest- use towel to assist  - 1 x daily - 7 x weekly - 1 sets - 3 reps - 5-10 hold - Supine Lower Trunk Rotation  - 1 x daily - 7 x weekly - 1 sets - 10 reps - Supine Gluteal Sets  - 1 x daily - 7 x weekly - 2 sets - 10 reps -  5 hold - Supine Hip Adduction Isometric with Ball  - 1 x daily - 7 x weekly - 2 sets - 10 reps - 5 hold - Hooklying Clamshell with Resistance  - 2 x daily - 7 x weekly - 1 sets - 10 reps - 5 hold    ASSESSMENT:   CLINICAL IMPRESSION: 10/17/2022 ***  *** Pt arrives w/ report of 6.5/10 hip pain, no issues after last session. Pt with increased symptoms after nu step that improves to baseline after seated sciatic nerve glides. Introduced hip flexion isos today with improved tolerance compared to previous sessions for hip flexion activities although does fatigue quickly. Progression limited by symptom irritability, remains limited with both lumbar and hip movements. No adverse events, some transient increases in discomfort with activity. Continues to demo limitations in hip strength/mobility and weightbearing tolerance, recommend skilled PT to address. Pt departs today's session in no acute distress,  all voiced questions/concerns addressed appropriately from PT perspective.       OBJECTIVE IMPAIRMENTS: Abnormal gait, decreased activity tolerance, decreased endurance, decreased mobility, difficulty walking, decreased ROM, decreased strength, impaired perceived functional ability, improper body mechanics, postural dysfunction, and pain.    ACTIVITY LIMITATIONS: carrying, lifting, bending, sitting, standing, squatting, sleeping, stairs, transfers, and locomotion level   PARTICIPATION LIMITATIONS: meal prep, cleaning, laundry, driving, community activity, and occupation   PERSONAL FACTORS: 3+ comorbidities: HTN, CHF, CVA, OSA, DM2, multiple MSK issues, depression  are also affecting patient's functional outcome.    REHAB POTENTIAL: Fair given comorbidities and severity of symptoms   CLINICAL DECISION MAKING: Evolving/moderate complexity   EVALUATION COMPLEXITY: Moderate     GOALS: Goals reviewed with patient? No given time constraints    SHORT TERM GOALS: Target date: 10/21/2022 Pt will  demonstrate appropriate understanding and performance of initially prescribed HEP in order to facilitate improved independence with management of symptoms.  Baseline: HEP provided on eval Goal status: INITIAL    2. Pt will score less than or equal to 38% on ODI in order to demonstrate improved perception of function due to symptoms.            Baseline: 44%            Goal status: INITIAL     LONG TERM GOALS: Target date: 11/11/2022 Pt will score less than or equal to 30% on ODI  in order to demonstrate improved perception of functional status due to symptoms.  Baseline: 44% Goal status: INITIAL   2.  Pt will report ability to stand/walk for up to 79mn with less than 2pt increase in resting pain in order to improve functional tolerance.  Baseline: up to 10/10 pain with functional mobility Goal status: INITIAL   3.  Pt will demonstrate hip flexion MMT of 4/5 bilaterally in order to demonstrate improved strength for functional movements.  Baseline: see MMT chart above Goal status: INITIAL   4. Pt will perform >8 repetitions for 30sec STS in order to demonstrate reduced fall risk and improved functional independence. (MCID of 2 repetitions, fall risk cutoff score 12-17 reps)            Baseline: 4.5 repetition            Goal status: INITIAL    5. Pt will demonstrate ability to lift up to 10# with less than 4/10 pain on NPS in order to demonstrate improved tolerance to household activities.             Baseline: NT due to symptom irritability, pt reports up to 10/10 pain with lifting            Goal status: INITIAL   PLAN:   PT FREQUENCY: 1-2x/week   PT DURATION: 6 weeks   PLANNED INTERVENTIONS: Therapeutic exercises, Therapeutic activity, Neuromuscular re-education, Balance training, Gait training, Patient/Family education, Self Care, Joint mobilization, Stair training, Aquatic Therapy, Dry Needling, Electrical stimulation, Spinal mobilization, Cryotherapy, Moist heat, Taping, Manual  therapy, and Re-evaluation.   PLAN FOR NEXT SESSION: sciatic nerve glides and hip flexion isos. Otherwise continue progression of lumbopelvic stability as tolerated ***      DLeeroy ChaPT, DPT 10/17/2022 12:15 PM

## 2022-10-20 ENCOUNTER — Encounter: Payer: Self-pay | Admitting: Physical Therapy

## 2022-10-20 ENCOUNTER — Ambulatory Visit: Payer: Medicaid Other | Admitting: Physical Therapy

## 2022-10-20 DIAGNOSIS — M6281 Muscle weakness (generalized): Secondary | ICD-10-CM

## 2022-10-20 DIAGNOSIS — M5459 Other low back pain: Secondary | ICD-10-CM

## 2022-10-20 DIAGNOSIS — M25552 Pain in left hip: Secondary | ICD-10-CM

## 2022-10-20 DIAGNOSIS — R2689 Other abnormalities of gait and mobility: Secondary | ICD-10-CM

## 2022-10-21 NOTE — Therapy (Signed)
OUTPATIENT PHYSICAL THERAPY TREATMENT NOTE   Patient Name: Cassie Mata MRN: [id number] DOB:[birth year], 57 y.o., female Today's Date: 10/24/2022  PCP: Deborah Chalk, FNP   REFERRING PROVIDER: Pedro Earls, MD  END OF SESSION:   PT End of Session - 10/24/22 0845     Visit Number 7    Number of Visits 13    Date for PT Re-Evaluation 11/25/22    Authorization Type [employer] MCD    Authorization Time Period no auth    Authorization - Visit Number 7    Authorization - Number of Visits 27   VL   PT Start Time 0845    PT Stop Time 0926    PT Time Calculation (min) 41 min    Activity Tolerance Patient tolerated treatment well    Behavior During Therapy WFL for tasks assessed/performed                 Past Medical History:  Diagnosis Date   Arthritis    Asthma    Cataract    NS OU   CHF (congestive heart failure) ([place])    Coma ([place])    Depression    Diabetes mellitus    Diabetic retinopathy ([place])    OU   Fibromyalgia    Gall stone    Gastroesophageal reflux disease    Hiatal hernia    Insomnia 08/07/2014   Myalgia and myositis, unspecified 01/13/2013   Stroke ([place]) 2007   Pt. evaluated at [facility] had left sided weakness.  Reportedly no cerebral imaging ever done.  Records we have received from [facility] dated 2011 do not document this.  Rest of records in storage and have not yet sent.   Past Surgical History:  Procedure Laterality Date   CESAREAN SECTION  [phone]   INNER EAR SURGERY     TUBAL LIGATION     Patient Active Problem List   Diagnosis Date Noted   Moderate persistent asthma without complication [id number]   Seasonal and perennial allergic rhinitis 04/24/2022   Allergic conjunctivitis of both eyes 04/24/2022   Osteoarthritis of hand 06/24/2021   Pain in left hand 06/04/2021   Paget's bone disease 06/04/2021   Constipation    Vomiting in adult 03/22/2021   AKI (acute kidney injury) ([place]) 03/22/2021    Metacarpal bone fracture 03/13/2021   Cerebrovascular accident (CVA) ([place]) [id number]   Chronic systolic congestive heart failure ([place]) 11/08/2020   OSA (obstructive sleep apnea) 11/08/2020   Tobacco abuse, in remission 11/08/2020   Diabetic cataract of both eyes ([place]) 10/18/2020   Proliferative diabetic retinopathy of both eyes with macular edema associated with type 2 diabetes mellitus ([place]) 10/18/2020   Vitamin D deficiency 12/20/2019   Diabetes mellitus type II, uncontrolled 08/08/2015   Essential hypertension 08/08/2015   GERD (gastroesophageal reflux disease) 08/08/2015   Chronic low back pain with sciatica 08/08/2015   Arthritis 08/08/2015   Type 2 diabetes mellitus ([place]) 08/08/2015   Depression 06/15/2015   Insomnia 08/07/2014   Myalgia and myositis 01/13/2013    REFERRING DIAG: M54.50 (ICD-10-CM) - Low back pain, unspecified  THERAPY DIAG:  Other low back pain  Pain in left hip  Muscle weakness (generalized)  Other abnormalities of gait and mobility  Rationale for Evaluation and Treatment Rehabilitation  PERTINENT HISTORY:  HTN, CHF, CVA, OSA, GERD, DM2, multiple MSK issues, depression, CKD3, fibromyalgia  PRECAUTIONS: fall risk, cardiac hx  SUBJECTIVE:  SUBJECTIVE STATEMENT:   Pt reports feeling okay after last session, did have some increased pain last night but unsure of provoking factor. States HEP update has been going well.   PAIN:  Are you having pain: yes 6/10 Location: low back and L groin How would you describe your pain? Dull, aching Best in past week: 3-4/10 (5/10 on eval) Worst in past week: 10/10 Aggravating factors: picking up from floor, stairs (going up especially), transfers, prolonged standing/walking, sitting 10-30 min, lying on left side Easing factors: changing  positions, self massage, pain cream   OBJECTIVE: (objective measures completed at initial evaluation unless otherwise dated)   DIAGNOSTIC FINDINGS:  No recent spine imaging in [facility] - pt states she had imaging of back and L hip after fall, states she thinks it showed a spur in her hip, states she does not think it showed any fracture.    PATIENT SURVEYS:  Modified oswestry: 22/50 raw, 44%  10/20/22 23/50 raw, 46%    SCREENING FOR RED FLAGS: Unremarkable red flag screening   COGNITION: Overall cognitive status: Within functional limits for tasks assessed                          SENSATION/NEURO: Light touch intact BLE No clonus No ataxia with gait     POSTURE: fwd head, rounded shoulders, reduced lordosis    PALPATION: Unable to test due to time constraints   LUMBAR ROM:    AROM eval 10/06/22 10/20/22 AROM  Flexion   Reaches mid shin , pain   Extension   10 most  painful  10% most painful  Right lateral flexion   Reach mid knee , pain   Left lateral flexion   Reaches 1 inch past knee, pain   Right rotation (seated) 75% Standing 50% pain 75% p!  Left rotation (seated) 75% Standing 75% 75%   (Blank rows = not tested)   LOWER EXTREMITY ROM:      Passive  Right eval Left eval Right 10/06/22 Left 10/06/22  Hip flexion     [facility]  Hip extension        Hip internal rotation     20 25  Hip external rotation     45  40 pain  10/06/22: Hamstring length: R 90, L 75 from hooklying  (Blank rows = not tested) (Key: WFL = within functional limits not formally assessed, * = concordant pain, s = stiffness/stretching sensation, NT = not tested)  Comments:     LOWER EXTREMITY MMT:     MMT Right eval Left eval  Hip flexion 4 3+ *  Hip abduction (modified sitting) 5 5  Hip internal rotation      Hip external rotation      Knee flexion 4 4  Knee extension 4 4    (Blank rows = not tested) (Key: WFL = within functional limits not formally assessed, * = concordant pain, s =  stiffness/stretching sensation, NT = not tested)  Comments: hip rotation MMT deferred due to symptom irritability     FUNCTIONAL TESTS:  30sec STS 4.5 repetitions heavy UE support and increasing back pain    GAIT: Distance walked: with clinic Assistive device utilized: None Level of assistance: Complete Independence Comments: wide BOS, reduced step length, reduced trunk rotation B, antalgic gait on L    TODAY'S TREATMENT:  [facility] Adult PT Treatment:                                                DATE: 10/24/22 Therapeutic Exercise: Swiss ball press down, seated, 2x15 cues for breath control  Seated swiss ball sidebending isos 2x8 cues for appropriate form and posture  Seated hip flexion isos self resisted 3x10 LLE only cues for posture and effort Modified plank on counter, 3x30sec cues for posture and hip positioning Standing heel raises 2x12 cues for reduced compensations w/ trunk lean  Seated sciatic nerve glides 2x10 cues for form and pacing, symptom monitoring   [facility] Adult PT Treatment:                                                DATE: 10/20/22 Therapeutic Exercise: Hip flexion isos self resisted 2x8 LLE only cues for appropriate force/effort and positioning, breath control Seated hip abduction GTB 2x8 cues for form and posture  Seated hamstring stretch 3x30sec Standing heel raises 2x10 cues for form and pacing  Seated sciatic nerve glides 2x8 cues for form and pacing  HEP update + handout  Therapeutic Activity: Oswestry + education  MSK assessment + education Education on pacing of activities, activity modification and monitoring symptoms   [facility] Adult PT Treatment:                                                DATE: 10/17/22 Therapeutic Exercise: Nustep L5 UE/LE 85mn during subjective Sciatic nerve glide 2x10 cues for form and pacing  Hip flexion isos self resisted 4x5 LLE only  cues for appropriate force and effort  Seated adductor iso 2x15 cues for form and breath control Seated thoracolumbar extension x10 cues for form and appropriate ROM  Standing hip extension x5 each LE   PATIENT EDUCATION:  Education details: rationale for interventions  Person educated: Patient Education method: Explanation, Demonstration, Tactile cues, Verbal cues Education comprehension: verbalized understanding, returned demonstration, verbal cues required, tactile cues required, and needs further education     HOME EXERCISE PROGRAM: Access Code: [email or web address]: [email or web address] Date: 10/20/2022 Prepared by: DEnis Slipper Exercises - knee to chest- use towel to assist  - 1 x daily - 7 x weekly - 1 sets - 3 reps - 5-10 hold - Supine Lower Trunk Rotation  - 1 x daily - 7 x weekly - 1 sets - 10 reps - Seated Hip Abduction with Resistance  - 1 x daily - 7 x weekly - 2 sets - 10 reps - Seated Hip Adduction Isometrics with Ball  - 1 x daily - 7 x weekly - 2 sets - 10 reps - Heel Raises with Counter Support  - 1 x daily - 7 x weekly - 2 sets - 10 reps    ASSESSMENT:   CLINICAL IMPRESSION: 10/24/2022 Pt arrives w/ 6/10 pain on NPS, increased pain last night but felt okay after last session. Today focusing on continuing to progress hip flexion and core stability as able, symptoms remain irritable w/ mobility but tend to tolerate isometrics and more stability based activity  better. Cues as above, pt endorses slow increase in pain as session goes on (up to 7-8/10) but improves w/ sciatic nerve glides at end of session (down to 4/10). No adverse events. Pt departs today's session in no acute distress, all voiced questions/concerns addressed appropriately from PT perspective.     OBJECTIVE IMPAIRMENTS: Abnormal gait, decreased activity tolerance, decreased endurance, decreased mobility, difficulty walking, decreased ROM, decreased strength, impaired perceived functional ability,  improper body mechanics, postural dysfunction, and pain.    ACTIVITY LIMITATIONS: carrying, lifting, bending, sitting, standing, squatting, sleeping, stairs, transfers, and locomotion level   PARTICIPATION LIMITATIONS: meal prep, cleaning, laundry, driving, community activity, and occupation   PERSONAL FACTORS: 3+ comorbidities: HTN, CHF, CVA, OSA, DM2, multiple MSK issues, depression  are also affecting patient's functional outcome.    REHAB POTENTIAL: Fair given comorbidities and severity of symptoms   CLINICAL DECISION MAKING: Evolving/moderate complexity   EVALUATION COMPLEXITY: Moderate     GOALS: Goals reviewed with patient? No given time constraints    SHORT TERM GOALS: Target date: 10/21/2022 Pt will demonstrate appropriate understanding and performance of initially prescribed HEP in order to facilitate improved independence with management of symptoms.  Baseline: HEP provided on eval 10/20/22: Pt reports adherence w/ HEP Goal status: MET   2. Pt will score less than or equal to 38% on ODI in order to demonstrate improved perception of function due to symptoms.            Baseline: 44%  10/20/22: 46%             Goal status: ONGOING    LONG TERM GOALS: Target date: 11/11/2022 Pt will score less than or equal to 30% on ODI  in order to demonstrate improved perception of functional status due to symptoms.  Baseline: 44% Goal status: INITIAL   2.  Pt will report ability to stand/walk for up to 42mn with less than 2pt increase in resting pain in order to improve functional tolerance.  Baseline: up to 10/10 pain with functional mobility Goal status: INITIAL   3.  Pt will demonstrate hip flexion MMT of 4/5 bilaterally in order to demonstrate improved strength for functional movements.  Baseline: see MMT chart above Goal status: INITIAL   4. Pt will perform >8 repetitions for 30sec STS in order to demonstrate reduced fall risk and improved functional independence. (MCID of 2  repetitions, fall risk cutoff score 12-17 reps)            Baseline: 4.5 repetition            Goal status: INITIAL    5. Pt will demonstrate ability to lift up to 10# with less than 4/10 pain on NPS in order to demonstrate improved tolerance to household activities.             Baseline: NT due to symptom irritability, pt reports up to 10/10 pain with lifting            Goal status: INITIAL   PLAN:   PT FREQUENCY: 1-2x/week   PT DURATION: 6 weeks   PLANNED INTERVENTIONS: Therapeutic exercises, Therapeutic activity, Neuromuscular re-education, Balance training, Gait training, Patient/Family education, Self Care, Joint mobilization, Stair training, Aquatic Therapy, Dry Needling, Electrical stimulation, Spinal mobilization, Cryotherapy, Moist heat, Taping, Manual therapy, and Re-evaluation.   PLAN FOR NEXT SESSION: sciatic nerve glides and hip flexion isos. Otherwise continue progression of lumbopelvic stability as tolerated       DLeeroy ChaPT, DPT 10/24/2022 9:31 AM

## 2022-10-24 ENCOUNTER — Encounter: Payer: Self-pay | Admitting: Physical Therapy

## 2022-10-24 ENCOUNTER — Ambulatory Visit: Payer: Medicaid Other | Admitting: Physical Therapy

## 2022-10-24 DIAGNOSIS — R2689 Other abnormalities of gait and mobility: Secondary | ICD-10-CM

## 2022-10-24 DIAGNOSIS — M5459 Other low back pain: Secondary | ICD-10-CM | POA: Diagnosis not present

## 2022-10-24 DIAGNOSIS — M25552 Pain in left hip: Secondary | ICD-10-CM

## 2022-10-24 DIAGNOSIS — M6281 Muscle weakness (generalized): Secondary | ICD-10-CM

## 2022-10-24 NOTE — Therapy (Signed)
OUTPATIENT PHYSICAL THERAPY TREATMENT NOTE   Patient Name: Cassie Mata MRN: [id number] DOB:12/24/1965, 57 y.o., female Today's Date: 10/27/2022  PCP: Deborah Chalk, FNP   REFERRING PROVIDER: Pedro Earls, MD  END OF SESSION:   PT End of Session - 10/27/22 [phone]     Visit Number 8    Number of Visits 13    Date for PT Re-Evaluation 11/25/22    Authorization Type [employer] MCD    Authorization Time Period no auth    Authorization - Visit Number 8    Authorization - Number of Visits 27   VL   PT Start Time [id number]    PT Stop Time 0916    PT Time Calculation (min) 41 min    Activity Tolerance Patient tolerated treatment well    Behavior During Therapy [facility] for tasks assessed/performed                  Past Medical History:  Diagnosis Date   Arthritis    Asthma    Cataract    NS OU   CHF (congestive heart failure) ([place])    Coma ([place])    Depression    Diabetes mellitus    Diabetic retinopathy ([place])    OU   Fibromyalgia    Gall stone    Gastroesophageal reflux disease    Hiatal hernia    Insomnia 08/07/2014   Myalgia and myositis, unspecified 01/13/2013   Stroke ([place]) 2007   Pt. evaluated at [facility] had left sided weakness.  Reportedly no cerebral imaging ever done.  Records we have received from [facility] dated 2011 do not document this.  Rest of records in storage and have not yet sent.   Past Surgical History:  Procedure Laterality Date   CESAREAN SECTION  [phone]   INNER EAR SURGERY     TUBAL LIGATION     Patient Active Problem List   Diagnosis Date Noted   Moderate persistent asthma without complication [id number]   Seasonal and perennial allergic rhinitis 04/24/2022   Allergic conjunctivitis of both eyes 04/24/2022   Osteoarthritis of hand 06/24/2021   Pain in left hand 06/04/2021   Paget's bone disease 06/04/2021   Constipation    Vomiting in adult 03/22/2021   AKI (acute kidney injury) ([place]) 03/22/2021    Metacarpal bone fracture 03/13/2021   Cerebrovascular accident (CVA) ([place]) [id number]   Chronic systolic congestive heart failure ([place]) 11/08/2020   OSA (obstructive sleep apnea) 11/08/2020   Tobacco abuse, in remission 11/08/2020   Diabetic cataract of both eyes ([place]) 10/18/2020   Proliferative diabetic retinopathy of both eyes with macular edema associated with type 2 diabetes mellitus ([place]) 10/18/2020   Vitamin D deficiency 12/20/2019   Diabetes mellitus type II, uncontrolled 08/08/2015   Essential hypertension 08/08/2015   GERD (gastroesophageal reflux disease) 08/08/2015   Chronic low back pain with sciatica 08/08/2015   Arthritis 08/08/2015   Type 2 diabetes mellitus ([place]) 08/08/2015   Depression 06/15/2015   Insomnia 08/07/2014   Myalgia and myositis 01/13/2013    REFERRING DIAG: M54.50 (ICD-10-CM) - Low back pain, unspecified  THERAPY DIAG:  Other low back pain  Pain in left hip  Muscle weakness (generalized)  Other abnormalities of gait and mobility  Rationale for Evaluation and Treatment Rehabilitation  PERTINENT HISTORY:  HTN, CHF, CVA, OSA, GERD, DM2, multiple MSK issues, depression, CKD3, fibromyalgia  PRECAUTIONS: fall risk, cardiac hx  SUBJECTIVE:  SUBJECTIVE STATEMENT:   Pt states she had a bit of her typical soreness/pain after last session that resolved quickly. Did have increased pain over the weekend she attributes to doing extra repetitions of her HEP, thinks that rotation exercise is most painful. 6/10 pain in hip/back on arrival. Wasn't able to see MD on Friday, will try to reschedule today  PAIN:  Are you having pain: yes  6/10 Location: low back and L groin How would you describe your pain? Dull, aching Best in past week: 3-4/10 (5/10 on eval) Worst in past week:  10/10 Aggravating factors: picking up from floor, stairs (going up especially), transfers, prolonged standing/walking, sitting 10-30 min, lying on left side Easing factors: changing positions, self massage, pain cream   OBJECTIVE: (objective measures completed at initial evaluation unless otherwise dated)   DIAGNOSTIC FINDINGS:  No recent spine imaging in [facility] - pt states she had imaging of back and L hip after fall, states she thinks it showed a spur in her hip, states she does not think it showed any fracture.    PATIENT SURVEYS:  Modified oswestry: 22/50 raw, 44%  10/20/22 23/50 raw, 46%    SCREENING FOR RED FLAGS: Unremarkable red flag screening   COGNITION: Overall cognitive status: Within functional limits for tasks assessed                          SENSATION/NEURO: Light touch intact BLE No clonus No ataxia with gait     POSTURE: fwd head, rounded shoulders, reduced lordosis    PALPATION: Unable to test due to time constraints   LUMBAR ROM:    AROM eval 10/06/22 10/20/22 AROM  Flexion   Reaches mid shin , pain   Extension   10 most  painful  10% most painful  Right lateral flexion   Reach mid knee , pain   Left lateral flexion   Reaches 1 inch past knee, pain   Right rotation (seated) 75% Standing 50% pain 75% p!  Left rotation (seated) 75% Standing 75% 75%   (Blank rows = not tested)   LOWER EXTREMITY ROM:      Passive  Right eval Left eval Right 10/06/22 Left 10/06/22  Hip flexion     [facility]  Hip extension        Hip internal rotation     20 25  Hip external rotation     45  40 pain  10/06/22: Hamstring length: R 90, L 75 from hooklying  (Blank rows = not tested) (Key: WFL = within functional limits not formally assessed, * = concordant pain, s = stiffness/stretching sensation, NT = not tested)  Comments:     LOWER EXTREMITY MMT:     MMT Right eval Left eval  Hip flexion 4 3+ *  Hip abduction (modified sitting) 5 5  Hip internal rotation       Hip external rotation      Knee flexion 4 4  Knee extension 4 4    (Blank rows = not tested) (Key: WFL = within functional limits not formally assessed, * = concordant pain, s = stiffness/stretching sensation, NT = not tested)  Comments: hip rotation MMT deferred due to symptom irritability     FUNCTIONAL TESTS:  30sec STS 4.5 repetitions heavy UE support and increasing back pain    GAIT: Distance walked: with clinic Assistive device utilized: None Level of assistance: Complete Independence Comments: wide BOS, reduced step length, reduced trunk  rotation B, antalgic gait on L    TODAY'S TREATMENT:                                                                                                [facility] Adult PT Treatment:                                                DATE: 10/27/22 Therapeutic Exercise: Sidebending iso w/ swiss ball 2x10 B cues for pacing  Swiss ball flexion iso 2x15 seated L QL stretch ball at wall 2x10, cues for UE positioning and setup Seated adduction iso 2x15 cues for pacing  Standing hip flexion iso march (ball at wall) 2x5 B LE cues for setup and positioning  Seated sciatic nerve glides 2x10 LLE only cues for setup, pacing, and HEP HEP review/update + handout   [facility] Adult PT Treatment:                                                DATE: 10/24/22 Therapeutic Exercise: Swiss ball press down, seated, 2x15 cues for breath control  Seated swiss ball sidebending isos 2x8 cues for appropriate form and posture  Seated hip flexion isos self resisted 3x10 LLE only cues for posture and effort Modified plank on counter, 3x30sec cues for posture and hip positioning Standing heel raises 2x12 cues for reduced compensations w/ trunk lean  Seated sciatic nerve glides 2x10 cues for form and pacing, symptom monitoring   [facility] Adult PT Treatment:                                                DATE: 10/20/22 Therapeutic Exercise: Hip flexion isos self resisted 2x8 LLE only cues  for appropriate force/effort and positioning, breath control Seated hip abduction GTB 2x8 cues for form and posture  Seated hamstring stretch 3x30sec Standing heel raises 2x10 cues for form and pacing  Seated sciatic nerve glides 2x8 cues for form and pacing  HEP update + handout  Therapeutic Activity: Oswestry + education  MSK assessment + education Education on pacing of activities, activity modification and monitoring symptoms   PATIENT EDUCATION:  Education details: rationale for interventions  Person educated: Patient Education method: Explanation, Demonstration, Tactile cues, Verbal cues Education comprehension: verbalized understanding, returned demonstration, verbal cues required, tactile cues required, and needs further education     HOME EXERCISE PROGRAM: Access Code: [email or web address] URL: [email or web address] Date: 10/27/2022 Prepared by: Enis Slipper  Exercises - knee to chest- use towel to assist  - 1 x daily - 7 x weekly - 1 sets - 3 reps - 5-10 hold - Seated Hip Abduction with Resistance  - 1 x daily - 7 x weekly - 2 sets -  10 reps - Seated Hip Adduction Isometrics with Ball  - 1 x daily - 7 x weekly - 2 sets - 10 reps - Heel Raises with Counter Support  - 1 x daily - 7 x weekly - 2 sets - 10 reps - Seated Sciatic Tensioner  - 1 x daily - 7 x weekly - 2 sets - 10 reps    ASSESSMENT:   CLINICAL IMPRESSION: 10/27/2022 Pt arrives w/ 6/10 pain on NPS, reports increased pain over weekend but felt about the same as usual after last session. Today focusing on increasing volume for familiar stability exercises to progress tissue tolerance, also integrating L QL mobility exercises with excellent tolerance, pt reporting 0/10 pain after core isos and QL mobility exercise. Removed LTR from pt HEP as she states that seems to be most provocative at home, will assess tolerance to HEP with update as above. Pt tolerates session well overall until progression of hip flexion  isos in standing position, reports fatigue during exercise but increase in pain to 8/10 afterwards, but reduction in pain with sciatic nerve glides afterwards (5/10). No adverse events, departs with slightly improved symptoms compared to arrival. Pt departs today's session in no acute distress, all voiced questions/concerns addressed appropriately from PT perspective.     OBJECTIVE IMPAIRMENTS: Abnormal gait, decreased activity tolerance, decreased endurance, decreased mobility, difficulty walking, decreased ROM, decreased strength, impaired perceived functional ability, improper body mechanics, postural dysfunction, and pain.    ACTIVITY LIMITATIONS: carrying, lifting, bending, sitting, standing, squatting, sleeping, stairs, transfers, and locomotion level   PARTICIPATION LIMITATIONS: meal prep, cleaning, laundry, driving, community activity, and occupation   PERSONAL FACTORS: 3+ comorbidities: HTN, CHF, CVA, OSA, DM2, multiple MSK issues, depression  are also affecting patient's functional outcome.    REHAB POTENTIAL: Fair given comorbidities and severity of symptoms   CLINICAL DECISION MAKING: Evolving/moderate complexity   EVALUATION COMPLEXITY: Moderate     GOALS: Goals reviewed with patient? No given time constraints    SHORT TERM GOALS: Target date: 10/21/2022 Pt will demonstrate appropriate understanding and performance of initially prescribed HEP in order to facilitate improved independence with management of symptoms.  Baseline: HEP provided on eval 10/20/22: Pt reports adherence w/ HEP Goal status: MET   2. Pt will score less than or equal to 38% on ODI in order to demonstrate improved perception of function due to symptoms.            Baseline: 44%  10/20/22: 46%             Goal status: ONGOING    LONG TERM GOALS: Target date: 11/11/2022 Pt will score less than or equal to 30% on ODI  in order to demonstrate improved perception of functional status due to symptoms.  Baseline:  44% Goal status: INITIAL   2.  Pt will report ability to stand/walk for up to 47mn with less than 2pt increase in resting pain in order to improve functional tolerance.  Baseline: up to 10/10 pain with functional mobility Goal status: INITIAL   3.  Pt will demonstrate hip flexion MMT of 4/5 bilaterally in order to demonstrate improved strength for functional movements.  Baseline: see MMT chart above Goal status: INITIAL   4. Pt will perform >8 repetitions for 30sec STS in order to demonstrate reduced fall risk and improved functional independence. (MCID of 2 repetitions, fall risk cutoff score 12-17 reps)            Baseline: 4.5 repetition  Goal status: INITIAL    5. Pt will demonstrate ability to lift up to 10# with less than 4/10 pain on NPS in order to demonstrate improved tolerance to household activities.             Baseline: NT due to symptom irritability, pt reports up to 10/10 pain with lifting            Goal status: INITIAL   PLAN:   PT FREQUENCY: 1-2x/week   PT DURATION: 6 weeks   PLANNED INTERVENTIONS: Therapeutic exercises, Therapeutic activity, Neuromuscular re-education, Balance training, Gait training, Patient/Family education, Self Care, Joint mobilization, Stair training, Aquatic Therapy, Dry Needling, Electrical stimulation, Spinal mobilization, Cryotherapy, Moist heat, Taping, Manual therapy, and Re-evaluation.   PLAN FOR NEXT SESSION: sciatic nerve glides and hip flexion isos. Otherwise continue progression of lumbopelvic stability as tolerated       Leeroy Cha PT, DPT 10/27/2022 9:21 AM

## 2022-10-27 ENCOUNTER — Ambulatory Visit: Payer: Medicaid Other | Admitting: Physical Therapy

## 2022-10-27 ENCOUNTER — Encounter: Payer: Self-pay | Admitting: Physical Therapy

## 2022-10-27 DIAGNOSIS — M5459 Other low back pain: Secondary | ICD-10-CM | POA: Diagnosis not present

## 2022-10-27 DIAGNOSIS — M25552 Pain in left hip: Secondary | ICD-10-CM

## 2022-10-27 DIAGNOSIS — R2689 Other abnormalities of gait and mobility: Secondary | ICD-10-CM

## 2022-10-27 DIAGNOSIS — M6281 Muscle weakness (generalized): Secondary | ICD-10-CM

## 2022-10-30 NOTE — Therapy (Signed)
OUTPATIENT PHYSICAL THERAPY TREATMENT NOTE   Patient Name: Cassie Mata MRN: [id number] DOB:07/25/1966, 57 y.o., female Today's Date: 10/31/2022  PCP: Deborah Chalk, FNP   REFERRING PROVIDER: Pedro Earls, MD  END OF SESSION:   PT End of Session - 10/31/22 1316     Visit Number 9    Number of Visits 13    Date for PT Re-Evaluation 11/25/22    Authorization Type [employer] MCD    Authorization Time Period no auth    Authorization - Visit Number 9    Authorization - Number of Visits 27   VL   PT Start Time [id number]    PT Stop Time 1357    PT Time Calculation (min) 41 min    Activity Tolerance Patient tolerated treatment well;No increased pain    Behavior During Therapy WFL for tasks assessed/performed                   Past Medical History:  Diagnosis Date   Arthritis    Asthma    Cataract    NS OU   CHF (congestive heart failure) (HCC)    Coma ([place])    Depression    Diabetes mellitus    Diabetic retinopathy ([place])    OU   Fibromyalgia    Gall stone    Gastroesophageal reflux disease    Hiatal hernia    Insomnia 08/07/2014   Myalgia and myositis, unspecified 01/13/2013   Stroke ([place]) 2007   Pt. evaluated at [facility] had left sided weakness.  Reportedly no cerebral imaging ever done.  Records we have received from [facility] dated 2011 do not document this.  Rest of records in storage and have not yet sent.   Past Surgical History:  Procedure Laterality Date   CESAREAN SECTION  [phone]   INNER EAR SURGERY     TUBAL LIGATION     Patient Active Problem List   Diagnosis Date Noted   Moderate persistent asthma without complication [id number]   Seasonal and perennial allergic rhinitis 04/24/2022   Allergic conjunctivitis of both eyes 04/24/2022   Osteoarthritis of hand 06/24/2021   Pain in left hand 06/04/2021   Paget's bone disease 06/04/2021   Constipation    Vomiting in adult 03/22/2021   AKI (acute kidney injury) ([place])  03/22/2021   Metacarpal bone fracture 03/13/2021   Cerebrovascular accident (CVA) ([place]) [id number]   Chronic systolic congestive heart failure ([place]) 11/08/2020   OSA (obstructive sleep apnea) 11/08/2020   Tobacco abuse, in remission 11/08/2020   Diabetic cataract of both eyes ([place]) 10/18/2020   Proliferative diabetic retinopathy of both eyes with macular edema associated with type 2 diabetes mellitus ([place]) 10/18/2020   Vitamin D deficiency 12/20/2019   Diabetes mellitus type II, uncontrolled 08/08/2015   Essential hypertension 08/08/2015   GERD (gastroesophageal reflux disease) 08/08/2015   Chronic low back pain with sciatica 08/08/2015   Arthritis 08/08/2015   Type 2 diabetes mellitus ([place]) 08/08/2015   Depression 06/15/2015   Insomnia 08/07/2014   Myalgia and myositis 01/13/2013    REFERRING DIAG: M54.50 (ICD-10-CM) - Low back pain, unspecified  THERAPY DIAG:  Other low back pain  Pain in left hip  Muscle weakness (generalized)  Other abnormalities of gait and mobility  Rationale for Evaluation and Treatment Rehabilitation  PERTINENT HISTORY:  HTN, CHF, CVA, OSA, GERD, DM2, multiple MSK issues, depression, CKD3, fibromyalgia  PRECAUTIONS: fall risk, cardiac hx  SUBJECTIVE:  SUBJECTIVE STATEMENT:   Pt arrives w/ 4/10 pain, states she felt fine after last session. Notes that removing LTRs was helpful. Interested in discussing POC (see assessment below). Feels therapy has been helpful, improving tolerance to stairs and walking but continues with high pain levels. Sees MD next week  PAIN:  Are you having pain: yes  4/10 Location: low back and L groin How would you describe your pain? Dull, aching Best in past week: 2/10 (5/10 on eval) Worst in past week: 10/10 Aggravating factors: picking up  from floor, stairs (going up especially), transfers, prolonged standing/walking, sitting 10-30 min, lying on left side Easing factors: changing positions, self massage, pain cream   OBJECTIVE: (objective measures completed at initial evaluation unless otherwise dated)   DIAGNOSTIC FINDINGS:  No recent spine imaging in [facility] - pt states she had imaging of back and L hip after fall, states she thinks it showed a spur in her hip, states she does not think it showed any fracture.    PATIENT SURVEYS:  Modified oswestry: 22/50 raw, 44%  10/20/22 23/50 raw, 46%    SCREENING FOR RED FLAGS: Unremarkable red flag screening   COGNITION: Overall cognitive status: Within functional limits for tasks assessed                          SENSATION/NEURO: Light touch intact BLE No clonus No ataxia with gait     POSTURE: fwd head, rounded shoulders, reduced lordosis    PALPATION: Unable to test due to time constraints   LUMBAR ROM:    AROM eval 10/06/22 10/20/22 AROM  Flexion   Reaches mid shin , pain   Extension   10 most  painful  10% most painful  Right lateral flexion   Reach mid knee , pain   Left lateral flexion   Reaches 1 inch past knee, pain   Right rotation (seated) 75% Standing 50% pain 75% p!  Left rotation (seated) 75% Standing 75% 75%   (Blank rows = not tested)   LOWER EXTREMITY ROM:      Passive  Right eval Left eval Right 10/06/22 Left 10/06/22  Hip flexion     [facility]  Hip extension        Hip internal rotation     20 25  Hip external rotation     45  40 pain  10/06/22: Hamstring length: R 90, L 75 from hooklying  (Blank rows = not tested) (Key: WFL = within functional limits not formally assessed, * = concordant pain, s = stiffness/stretching sensation, NT = not tested)  Comments:     LOWER EXTREMITY MMT:     MMT Right eval Left eval  Hip flexion 4 3+ *  Hip abduction (modified sitting) 5 5  Hip internal rotation      Hip external rotation      Knee flexion  4 4  Knee extension 4 4    (Blank rows = not tested) (Key: WFL = within functional limits not formally assessed, * = concordant pain, s = stiffness/stretching sensation, NT = not tested)  Comments: hip rotation MMT deferred due to symptom irritability     FUNCTIONAL TESTS:  30sec STS 4.5 repetitions heavy UE support and increasing back pain    GAIT: Distance walked: with clinic Assistive device utilized: None Level of assistance: Complete Independence Comments: wide BOS, reduced step length, reduced trunk rotation B, antalgic gait on L    TODAY'S TREATMENT:  [facility] Adult PT Treatment:                                                DATE: 10/31/22 Therapeutic Exercise: Standing marches 2x5 B LE w counter support cues for form and pacing Standing TKE against ball at wall 2x10 LLE only  Sciatic nerve glides x10 LLE only  Standing weight shift onto 4inch step 2x10 LLE only  Reinforced HEP  Manual Therapy: Sidelying w/ pillow between knees: gentle STM beginning at distal L QL and proximal glute attachment working through glutes/piriformis towards  Supine: Gentle SAD L hip joint grade 1 initially working up to grade 2-3 per pt tolerance   [facility] Adult PT Treatment:                                                DATE: 10/27/22 Therapeutic Exercise: Sidebending iso w/ swiss ball 2x10 B cues for pacing  Swiss ball flexion iso 2x15 seated L QL stretch ball at wall 2x10, cues for UE positioning and setup Seated adduction iso 2x15 cues for pacing  Standing hip flexion iso march (ball at wall) 2x5 B LE cues for setup and positioning  Seated sciatic nerve glides 2x10 LLE only cues for setup, pacing, and HEP HEP review/update + handout   [facility] Adult PT Treatment:                                                DATE: 10/24/22 Therapeutic Exercise: Swiss ball press down, seated, 2x15 cues for breath control   Seated swiss ball sidebending isos 2x8 cues for appropriate form and posture  Seated hip flexion isos self resisted 3x10 LLE only cues for posture and effort Modified plank on counter, 3x30sec cues for posture and hip positioning Standing heel raises 2x12 cues for reduced compensations w/ trunk lean  Seated sciatic nerve glides 2x10 cues for form and pacing, symptom monitoring   PATIENT EDUCATION:  Education details: rationale for interventions  Person educated: Patient Education method: Explanation, Demonstration, Tactile cues, Verbal cues Education comprehension: verbalized understanding, returned demonstration, verbal cues required, tactile cues required, and needs further education     HOME EXERCISE PROGRAM: Access Code: [email or web address] URL: [email or web address] Date: 10/27/2022 Prepared by: Enis Slipper  Exercises - knee to chest- use towel to assist  - 1 x daily - 7 x weekly - 1 sets - 3 reps - 5-10 hold - Seated Hip Abduction with Resistance  - 1 x daily - 7 x weekly - 2 sets - 10 reps - Seated Hip Adduction Isometrics with Ball  - 1 x daily - 7 x weekly - 2 sets - 10 reps - Heel Raises with Counter Support  - 1 x daily - 7 x weekly - 2 sets - 10 reps - Seated Sciatic Tensioner  - 1 x daily - 7 x weekly - 2 sets - 10 reps    ASSESSMENT:   CLINICAL IMPRESSION: 10/31/2022 Pt arrives w/ 4/10 pain on NPS. Is inquisitive about PT POC as she would like to try to manage exercise more independently - after discussing  options and progress with PT thus far, mutual decision made to continue w/ PT with reduced frequency of 1x/week to facilitate improved independence. Pt able to progress to standing marches, tolerates well initially but increase in pain up to 7/10 with repetition and addition of TKE. Trial of manual as described above, test-treat-retest with brief walking within clinic and pt reports reduced of pain to 2/10 post manual, tolerates quite well. Continues to report  relief from sciatic nerve glides. No adverse events, pt departs with 0/10 pain. Pt departs today's session in no acute distress, all voiced questions/concerns addressed appropriately from PT perspective.     OBJECTIVE IMPAIRMENTS: Abnormal gait, decreased activity tolerance, decreased endurance, decreased mobility, difficulty walking, decreased ROM, decreased strength, impaired perceived functional ability, improper body mechanics, postural dysfunction, and pain.    ACTIVITY LIMITATIONS: carrying, lifting, bending, sitting, standing, squatting, sleeping, stairs, transfers, and locomotion level   PARTICIPATION LIMITATIONS: meal prep, cleaning, laundry, driving, community activity, and occupation   PERSONAL FACTORS: 3+ comorbidities: HTN, CHF, CVA, OSA, DM2, multiple MSK issues, depression  are also affecting patient's functional outcome.    REHAB POTENTIAL: Fair given comorbidities and severity of symptoms   CLINICAL DECISION MAKING: Evolving/moderate complexity   EVALUATION COMPLEXITY: Moderate     GOALS: Goals reviewed with patient? No given time constraints    SHORT TERM GOALS: Target date: 10/21/2022 Pt will demonstrate appropriate understanding and performance of initially prescribed HEP in order to facilitate improved independence with management of symptoms.  Baseline: HEP provided on eval 10/20/22: Pt reports adherence w/ HEP Goal status: MET   2. Pt will score less than or equal to 38% on ODI in order to demonstrate improved perception of function due to symptoms.            Baseline: 44%  10/20/22: 46%             Goal status: ONGOING    LONG TERM GOALS: Target date: 11/11/2022 Pt will score less than or equal to 30% on ODI  in order to demonstrate improved perception of functional status due to symptoms.  Baseline: 44% Goal status: INITIAL   2.  Pt will report ability to stand/walk for up to 45mn with less than 2pt increase in resting pain in order to improve functional  tolerance.  Baseline: up to 10/10 pain with functional mobility Goal status: INITIAL   3.  Pt will demonstrate hip flexion MMT of 4/5 bilaterally in order to demonstrate improved strength for functional movements.  Baseline: see MMT chart above Goal status: INITIAL   4. Pt will perform >8 repetitions for 30sec STS in order to demonstrate reduced fall risk and improved functional independence. (MCID of 2 repetitions, fall risk cutoff score 12-17 reps)            Baseline: 4.5 repetition            Goal status: INITIAL    5. Pt will demonstrate ability to lift up to 10# with less than 4/10 pain on NPS in order to demonstrate improved tolerance to household activities.             Baseline: NT due to symptom irritability, pt reports up to 10/10 pain with lifting            Goal status: INITIAL   PLAN:   PT FREQUENCY: 1-2x/week   PT DURATION: 6 weeks   PLANNED INTERVENTIONS: Therapeutic exercises, Therapeutic activity, Neuromuscular re-education, Balance training, Gait training, Patient/Family education, Self Care, Joint mobilization,  Stair training, Aquatic Therapy, Dry Needling, Electrical stimulation, Spinal mobilization, Cryotherapy, Moist heat, Taping, Manual therapy, and Re-evaluation.   PLAN FOR NEXT SESSION: manual as indicated. Continue w/ hip + low back strengthening as able/appropriate       Leeroy Cha PT, DPT 10/31/2022 2:09 PM

## 2022-10-31 ENCOUNTER — Encounter: Payer: Self-pay | Admitting: Physical Therapy

## 2022-10-31 ENCOUNTER — Ambulatory Visit: Payer: Medicaid Other | Admitting: Physical Therapy

## 2022-10-31 DIAGNOSIS — M25552 Pain in left hip: Secondary | ICD-10-CM

## 2022-10-31 DIAGNOSIS — M5459 Other low back pain: Secondary | ICD-10-CM

## 2022-10-31 DIAGNOSIS — M6281 Muscle weakness (generalized): Secondary | ICD-10-CM

## 2022-10-31 DIAGNOSIS — R2689 Other abnormalities of gait and mobility: Secondary | ICD-10-CM

## 2022-11-05 ENCOUNTER — Encounter: Payer: Medicaid Other | Admitting: Physical Therapy

## 2022-11-06 NOTE — Therapy (Signed)
OUTPATIENT PHYSICAL THERAPY TREATMENT NOTE   Patient Name: Cassie Mata MRN: [id number] DOB:[birth year], 57 y.o., female Today's Date: 11/07/2022  PCP: Deborah Chalk, FNP   REFERRING PROVIDER: Pedro Earls, MD  END OF SESSION:   PT End of Session - 11/07/22 0839     Visit Number 10    Number of Visits 13    Date for PT Re-Evaluation 11/25/22    Authorization Type [employer] MCD    Authorization Time Period no auth    Authorization - Visit Number 10    Authorization - Number of Visits 27   VL   PT Start Time 0840    PT Stop Time 0921    PT Time Calculation (min) 41 min    Activity Tolerance Patient tolerated treatment well    Behavior During Therapy [facility] for tasks assessed/performed                    Past Medical History:  Diagnosis Date   Arthritis    Asthma    Cataract    NS OU   CHF (congestive heart failure) ([place])    Coma ([place])    Depression    Diabetes mellitus    Diabetic retinopathy ([place])    OU   Fibromyalgia    Gall stone    Gastroesophageal reflux disease    Hiatal hernia    Insomnia 08/07/2014   Myalgia and myositis, unspecified 01/13/2013   Stroke ([place]) 2007   Pt. evaluated at [facility] had left sided weakness.  Reportedly no cerebral imaging ever done.  Records we have received from [facility] dated 2011 do not document this.  Rest of records in storage and have not yet sent.   Past Surgical History:  Procedure Laterality Date   CESAREAN SECTION  [phone]   INNER EAR SURGERY     TUBAL LIGATION     Patient Active Problem List   Diagnosis Date Noted   Moderate persistent asthma without complication [id number]   Seasonal and perennial allergic rhinitis 04/24/2022   Allergic conjunctivitis of both eyes 04/24/2022   Osteoarthritis of hand 06/24/2021   Pain in left hand 06/04/2021   Paget's bone disease 06/04/2021   Constipation    Vomiting in adult 03/22/2021   AKI (acute kidney injury) ([place]) 03/22/2021    Metacarpal bone fracture 03/13/2021   Cerebrovascular accident (CVA) ([place]) [id number]   Chronic systolic congestive heart failure ([place]) 11/08/2020   OSA (obstructive sleep apnea) 11/08/2020   Tobacco abuse, in remission 11/08/2020   Diabetic cataract of both eyes ([place]) 10/18/2020   Proliferative diabetic retinopathy of both eyes with macular edema associated with type 2 diabetes mellitus ([place]) 10/18/2020   Vitamin D deficiency 12/20/2019   Diabetes mellitus type II, uncontrolled 08/08/2015   Essential hypertension 08/08/2015   GERD (gastroesophageal reflux disease) 08/08/2015   Chronic low back pain with sciatica 08/08/2015   Arthritis 08/08/2015   Type 2 diabetes mellitus ([place]) 08/08/2015   Depression 06/15/2015   Insomnia 08/07/2014   Myalgia and myositis 01/13/2013    REFERRING DIAG: M54.50 (ICD-10-CM) - Low back pain, unspecified  THERAPY DIAG:  Other low back pain  Pain in left hip  Muscle weakness (generalized)  Other abnormalities of gait and mobility  Rationale for Evaluation and Treatment Rehabilitation  PERTINENT HISTORY:  HTN, CHF, CVA, OSA, GERD, DM2, multiple MSK issues, depression, CKD3, fibromyalgia  PRECAUTIONS: fall risk, cardiac hx  SUBJECTIVE:  SUBJECTIVE STATEMENT:   Pt arrives w/o resting pain at present, states she feels manual was helpful at last session. Notes floor transfers are getting easier, stairs still improving but continues to have difficulty especially when carrying items. States she is seeing MD Monday   PAIN:  Are you having pain: yes  0/10 Location: low back and L groin How would you describe your pain? Dull, aching Best in past week: 0/10 (5/10 on eval) Worst in past week: 10/10 Aggravating factors: picking up from floor, stairs (going up especially),  transfers, prolonged standing/walking, sitting 10-30 min, lying on left side Easing factors: changing positions, self massage, pain cream   OBJECTIVE: (objective measures completed at initial evaluation unless otherwise dated)   DIAGNOSTIC FINDINGS:  No recent spine imaging in [facility] - pt states she had imaging of back and L hip after fall, states she thinks it showed a spur in her hip, states she does not think it showed any fracture.    PATIENT SURVEYS:  Modified oswestry: 22/50 raw, 44%  10/20/22 23/50 raw, 46%    SCREENING FOR RED FLAGS: Unremarkable red flag screening   COGNITION: Overall cognitive status: Within functional limits for tasks assessed                          SENSATION/NEURO: Light touch intact BLE No clonus No ataxia with gait     POSTURE: fwd head, rounded shoulders, reduced lordosis    PALPATION: Unable to test due to time constraints   LUMBAR ROM:    AROM eval 10/06/22 10/20/22 AROM  Flexion   Reaches mid shin , pain   Extension   10 most  painful  10% most painful  Right lateral flexion   Reach mid knee , pain   Left lateral flexion   Reaches 1 inch past knee, pain   Right rotation (seated) 75% Standing 50% pain 75% p!  Left rotation (seated) 75% Standing 75% 75%   (Blank rows = not tested)   LOWER EXTREMITY ROM:      Passive  Right eval Left eval Right 10/06/22 Left 10/06/22  Hip flexion     [facility]  Hip extension        Hip internal rotation     20 25  Hip external rotation     45  40 pain  10/06/22: Hamstring length: R 90, L 75 from hooklying  (Blank rows = not tested) (Key: WFL = within functional limits not formally assessed, * = concordant pain, s = stiffness/stretching sensation, NT = not tested)  Comments:     LOWER EXTREMITY MMT:     MMT Right eval Left eval  Hip flexion 4 3+ *  Hip abduction (modified sitting) 5 5  Hip internal rotation      Hip external rotation      Knee flexion 4 4  Knee extension 4 4    (Blank rows  = not tested) (Key: WFL = within functional limits not formally assessed, * = concordant pain, s = stiffness/stretching sensation, NT = not tested)  Comments: hip rotation MMT deferred due to symptom irritability     FUNCTIONAL TESTS:  30sec STS 4.5 repetitions heavy UE support and increasing back pain  11/07/22 30sec STS: 9 repetitions moderate UE support from standard chair, thigh soreness no extra back pain    GAIT: Distance walked: with clinic Assistive device utilized: None Level of assistance: Complete Independence Comments: wide BOS, reduced step length,  reduced trunk rotation B, antalgic gait on L    TODAY'S TREATMENT:                                                                                                [facility] Adult PT Treatment:                                                DATE: 11/07/22 Therapeutic Exercise: Heel raises 2x15 cues for upright posture and pacing  2inch step up LLE only w/ RTB for TKE 2x8 cues for form and posture Seated RTB hip ER w/ ball as fulcrum x10 cues for form and posture  LLE sciatic nerve glides x12   Manual Therapy: Sidelying w/ pillow between knees: gentle STM beginning at distal L QL and proximal glute attachment working through glutes/piriformis towards greater trochanter, gentle trigger point release glute med Supine: Gentle SAD L hip joint grade 1-3  Therapeutic Activity: 30sec STS + education 4 inch step up 2x5 B LE cues for pacing and mechanics    [facility] Adult PT Treatment:                                                DATE: 10/31/22 Therapeutic Exercise: Standing marches 2x5 B LE w counter support cues for form and pacing Standing TKE against ball at wall 2x10 LLE only  Sciatic nerve glides x10 LLE only  Standing weight shift onto 4inch step 2x10 LLE only  Reinforced HEP  Manual Therapy: Sidelying w/ pillow between knees: gentle STM beginning at distal L QL and proximal glute attachment working through glutes/piriformis towards   Supine: Gentle SAD L hip joint grade 1 initially working up to grade 2-3 per pt tolerance   [facility] Adult PT Treatment:                                                DATE: 10/27/22 Therapeutic Exercise: Sidebending iso w/ swiss ball 2x10 B cues for pacing  Swiss ball flexion iso 2x15 seated L QL stretch ball at wall 2x10, cues for UE positioning and setup Seated adduction iso 2x15 cues for pacing  Standing hip flexion iso march (ball at wall) 2x5 B LE cues for setup and positioning  Seated sciatic nerve glides 2x10 LLE only cues for setup, pacing, and HEP HEP review/update + handout   PATIENT EDUCATION:  Education details: rationale for interventions  Person educated: Patient Education method: Explanation, Demonstration, Tactile cues, Verbal cues Education comprehension: verbalized understanding, returned demonstration, verbal cues required, tactile cues required, and needs further education     HOME EXERCISE PROGRAM: Access Code: [email or web address] URL: [email or web address] Date: 10/27/2022 Prepared by: Enis Slipper  Exercises - knee to chest-  use towel to assist  - 1 x daily - 7 x weekly - 1 sets - 3 reps - 5-10 hold - Seated Hip Abduction with Resistance  - 1 x daily - 7 x weekly - 2 sets - 10 reps - Seated Hip Adduction Isometrics with Ball  - 1 x daily - 7 x weekly - 2 sets - 10 reps - Heel Raises with Counter Support  - 1 x daily - 7 x weekly - 2 sets - 10 reps - Seated Sciatic Tensioner  - 1 x daily - 7 x weekly - 2 sets - 10 reps    ASSESSMENT:   CLINICAL IMPRESSION: 11/07/2022 Pt arrives w/o pain, states she felt better after introduction of manual last session. Today pt with notable improvement in 30sec STS, doubled repetitions compared to initial eval. Also able to progress for step up training although this does increase in pain up to 8/10, although this does gradually resolve with standing rest. Also incorporated TKE + step up for improved quad control with step  ups. Ended session with manual to mitigate muscle soreness as pt does endorse significant muscular fatigue although she does have some increased irritability today with palpation and more trigger points throughout glute med. Departs with primary complaint of soreness. Recommend continuing along current POC to address relevant MSK deficits. Pt departs today's session in no acute distress, all voiced questions/concerns addressed appropriately from PT perspective.      OBJECTIVE IMPAIRMENTS: Abnormal gait, decreased activity tolerance, decreased endurance, decreased mobility, difficulty walking, decreased ROM, decreased strength, impaired perceived functional ability, improper body mechanics, postural dysfunction, and pain.    ACTIVITY LIMITATIONS: carrying, lifting, bending, sitting, standing, squatting, sleeping, stairs, transfers, and locomotion level   PARTICIPATION LIMITATIONS: meal prep, cleaning, laundry, driving, community activity, and occupation   PERSONAL FACTORS: 3+ comorbidities: HTN, CHF, CVA, OSA, DM2, multiple MSK issues, depression  are also affecting patient's functional outcome.    REHAB POTENTIAL: Fair given comorbidities and severity of symptoms   CLINICAL DECISION MAKING: Evolving/moderate complexity   EVALUATION COMPLEXITY: Moderate     GOALS: Goals reviewed with patient? No given time constraints    SHORT TERM GOALS: Target date: 10/21/2022 Pt will demonstrate appropriate understanding and performance of initially prescribed HEP in order to facilitate improved independence with management of symptoms.  Baseline: HEP provided on eval 10/20/22: Pt reports adherence w/ HEP Goal status: MET   2. Pt will score less than or equal to 38% on ODI in order to demonstrate improved perception of function due to symptoms.            Baseline: 44%  10/20/22: 46%             Goal status: ONGOING    LONG TERM GOALS: Target date: 11/11/2022 Pt will score less than or equal to 30% on  ODI  in order to demonstrate improved perception of functional status due to symptoms.  Baseline: 44% Goal status: INITIAL   2.  Pt will report ability to stand/walk for up to 92mn with less than 2pt increase in resting pain in order to improve functional tolerance.  Baseline: up to 10/10 pain with functional mobility Goal status: INITIAL   3.  Pt will demonstrate hip flexion MMT of 4/5 bilaterally in order to demonstrate improved strength for functional movements.  Baseline: see MMT chart above Goal status: INITIAL   4. Pt will perform >8 repetitions for 30sec STS in order to demonstrate reduced fall risk and improved functional  independence. (MCID of 2 repetitions, fall risk cutoff score 12-17 reps)            Baseline: 4.5 repetition            Goal status: INITIAL    5. Pt will demonstrate ability to lift up to 10# with less than 4/10 pain on NPS in order to demonstrate improved tolerance to household activities.             Baseline: NT due to symptom irritability, pt reports up to 10/10 pain with lifting            Goal status: INITIAL   PLAN:   PT FREQUENCY: 1-2x/week   PT DURATION: 6 weeks   PLANNED INTERVENTIONS: Therapeutic exercises, Therapeutic activity, Neuromuscular re-education, Balance training, Gait training, Patient/Family education, Self Care, Joint mobilization, Stair training, Aquatic Therapy, Dry Needling, Electrical stimulation, Spinal mobilization, Cryotherapy, Moist heat, Taping, Manual therapy, and Re-evaluation.   PLAN FOR NEXT SESSION: manual as indicated. Continue w/ hip + low back strengthening as able/appropriate       Leeroy Cha PT, DPT 11/07/2022 12:02 PM

## 2022-11-07 ENCOUNTER — Encounter: Payer: Self-pay | Admitting: Physical Therapy

## 2022-11-07 ENCOUNTER — Ambulatory Visit: Payer: Medicaid Other | Attending: Nurse Practitioner | Admitting: Physical Therapy

## 2022-11-07 DIAGNOSIS — M5459 Other low back pain: Secondary | ICD-10-CM | POA: Insufficient documentation

## 2022-11-07 DIAGNOSIS — M25552 Pain in left hip: Secondary | ICD-10-CM | POA: Insufficient documentation

## 2022-11-07 DIAGNOSIS — R2689 Other abnormalities of gait and mobility: Secondary | ICD-10-CM | POA: Insufficient documentation

## 2022-11-07 DIAGNOSIS — M6281 Muscle weakness (generalized): Secondary | ICD-10-CM | POA: Insufficient documentation

## 2022-11-11 ENCOUNTER — Encounter: Payer: Medicaid Other | Admitting: Physical Therapy

## 2022-11-11 NOTE — Therapy (Signed)
OUTPATIENT PHYSICAL THERAPY TREATMENT NOTE   Patient Name: Cassie Mata MRN: [id number] DOB:May 01, 1966, 57 y.o., female Today's Date: 11/13/2022  PCP: Deborah Chalk, FNP   REFERRING PROVIDER: Pedro Earls, MD  END OF SESSION:   PT End of Session - 11/13/22 0845     Visit Number 11    Number of Visits 13    Date for PT Re-Evaluation 11/25/22    Authorization Type [employer] MCD    Authorization Time Period no auth    Authorization - Visit Number 11    Authorization - Number of Visits 27   VL   PT Start Time 0846    PT Stop Time 0925    PT Time Calculation (min) 39 min    Activity Tolerance Patient tolerated treatment well    Behavior During Therapy WFL for tasks assessed/performed              Past Medical History:  Diagnosis Date   Arthritis    Asthma    Cataract    NS OU   CHF (congestive heart failure) ([place])    Coma ([place])    Depression    Diabetes mellitus    Diabetic retinopathy ([place])    OU   Fibromyalgia    Gall stone    Gastroesophageal reflux disease    Hiatal hernia    Insomnia 08/07/2014   Myalgia and myositis, unspecified 01/13/2013   Stroke ([place]) 2007   Pt. evaluated at [facility] had left sided weakness.  Reportedly no cerebral imaging ever done.  Records we have received from [facility] dated 2011 do not document this.  Rest of records in storage and have not yet sent.   Past Surgical History:  Procedure Laterality Date   CESAREAN SECTION  [phone]   INNER EAR SURGERY     TUBAL LIGATION     Patient Active Problem List   Diagnosis Date Noted   Moderate persistent asthma without complication [id number]   Seasonal and perennial allergic rhinitis 04/24/2022   Allergic conjunctivitis of both eyes 04/24/2022   Osteoarthritis of hand 06/24/2021   Pain in left hand 06/04/2021   Paget's bone disease 06/04/2021   Constipation    Vomiting in adult 03/22/2021   AKI (acute kidney injury) ([place]) 03/22/2021   Metacarpal  bone fracture 03/13/2021   Cerebrovascular accident (CVA) ([place]) [id number]   Chronic systolic congestive heart failure ([place]) 11/08/2020   OSA (obstructive sleep apnea) 11/08/2020   Tobacco abuse, in remission 11/08/2020   Diabetic cataract of both eyes ([place]) 10/18/2020   Proliferative diabetic retinopathy of both eyes with macular edema associated with type 2 diabetes mellitus ([place]) 10/18/2020   Vitamin D deficiency 12/20/2019   Diabetes mellitus type II, uncontrolled 08/08/2015   Essential hypertension 08/08/2015   GERD (gastroesophageal reflux disease) 08/08/2015   Chronic low back pain with sciatica 08/08/2015   Arthritis 08/08/2015   Type 2 diabetes mellitus ([place]) 08/08/2015   Depression 06/15/2015   Insomnia 08/07/2014   Myalgia and myositis 01/13/2013    REFERRING DIAG: M54.50 (ICD-10-CM) - Low back pain, unspecified  THERAPY DIAG:  Other low back pain  Pain in left hip  Muscle weakness (generalized)  Other abnormalities of gait and mobility  Rationale for Evaluation and Treatment Rehabilitation  PERTINENT HISTORY:  HTN, CHF, CVA, OSA, GERD, DM2, multiple MSK issues, depression, CKD3, fibromyalgia  PRECAUTIONS: fall risk, cardiac hx  SUBJECTIVE:  SUBJECTIVE STATEMENT:   States she wasn't able to see MD due to copay issues. States she felt okay after last session but symptoms are a bit more irritated today. Reports good HEP adherence, some exercises getting easier but some remain challenging.   PAIN:  Are you having pain: yes  6/10 Location: low back and L groin How would you describe your pain? Dull, aching Best in past week: 0/10 (5/10 on eval) Worst in past week: 10/10 Aggravating factors: picking up from floor, stairs (going up especially), transfers, prolonged standing/walking,  sitting 10-30 min, lying on left side Easing factors: changing positions, self massage, pain cream   OBJECTIVE: (objective measures completed at initial evaluation unless otherwise dated)   DIAGNOSTIC FINDINGS:  No recent spine imaging in [facility] - pt states she had imaging of back and L hip after fall, states she thinks it showed a spur in her hip, states she does not think it showed any fracture.    PATIENT SURVEYS:  Modified oswestry: 22/50 raw, 44%  10/20/22 23/50 raw, 46%    SCREENING FOR RED FLAGS: Unremarkable red flag screening   COGNITION: Overall cognitive status: Within functional limits for tasks assessed                          SENSATION/NEURO: Light touch intact BLE No clonus No ataxia with gait     POSTURE: fwd head, rounded shoulders, reduced lordosis    PALPATION: Unable to test due to time constraints   LUMBAR ROM:    AROM eval 10/06/22 10/20/22 AROM  Flexion   Reaches mid shin , pain   Extension   10 most  painful  10% most painful  Right lateral flexion   Reach mid knee , pain   Left lateral flexion   Reaches 1 inch past knee, pain   Right rotation (seated) 75% Standing 50% pain 75% p!  Left rotation (seated) 75% Standing 75% 75%   (Blank rows = not tested)   LOWER EXTREMITY ROM:      Passive  Right eval Left eval Right 10/06/22 Left 10/06/22  Hip flexion     [facility]  Hip extension        Hip internal rotation     20 25  Hip external rotation     45  40 pain  10/06/22: Hamstring length: R 90, L 75 from hooklying  (Blank rows = not tested) (Key: WFL = within functional limits not formally assessed, * = concordant pain, s = stiffness/stretching sensation, NT = not tested)  Comments:     LOWER EXTREMITY MMT:     MMT Right eval Left eval  Hip flexion 4 3+ *  Hip abduction (modified sitting) 5 5  Hip internal rotation      Hip external rotation      Knee flexion 4 4  Knee extension 4 4    (Blank rows = not tested) (Key: WFL = within  functional limits not formally assessed, * = concordant pain, s = stiffness/stretching sensation, NT = not tested)  Comments: hip rotation MMT deferred due to symptom irritability     FUNCTIONAL TESTS:  30sec STS 4.5 repetitions heavy UE support and increasing back pain  11/07/22 30sec STS: 9 repetitions moderate UE support from standard chair, thigh soreness no extra back pain    GAIT: Distance walked: with clinic Assistive device utilized: None Level of assistance: Complete Independence Comments: wide BOS, reduced step length, reduced trunk  rotation B, antalgic gait on L    TODAY'S TREATMENT:                                                                                                [facility] Adult PT Treatment:                                                DATE: 11/13/22 Therapeutic Exercise: RTB IR with ball as fulcrum 2x10 cues for setup RTB ER w/ ball as fulcrum 2x10 cues for setup Heel/toe raises 2x12 cues for reduced compensations w trunk  Hooklying shoulder extension pull downs RTB 2x10 cues for core contraction and pacing Seated swiss ball press down x12 cues for breath control Cable column paloff press 7# x8 B cues for form and posture  Sciatic nerve glides x10 B LE    [facility] Adult PT Treatment:                                                DATE: 11/07/22 Therapeutic Exercise: Heel raises 2x15 cues for upright posture and pacing  2inch step up LLE only w/ RTB for TKE 2x8 cues for form and posture Seated RTB hip ER w/ ball as fulcrum x10 cues for form and posture  LLE sciatic nerve glides x12   Manual Therapy: Sidelying w/ pillow between knees: gentle STM beginning at distal L QL and proximal glute attachment working through glutes/piriformis towards greater trochanter, gentle trigger point release glute med Supine: Gentle SAD L hip joint grade 1-3  Therapeutic Activity: 30sec STS + education 4 inch step up 2x5 B LE cues for pacing and mechanics    [facility] Adult PT Treatment:                                                 DATE: 10/31/22 Therapeutic Exercise: Standing marches 2x5 B LE w counter support cues for form and pacing Standing TKE against ball at wall 2x10 LLE only  Sciatic nerve glides x10 LLE only  Standing weight shift onto 4inch step 2x10 LLE only  Reinforced HEP  Manual Therapy: Sidelying w/ pillow between knees: gentle STM beginning at distal L QL and proximal glute attachment working through glutes/piriformis towards  Supine: Gentle SAD L hip joint grade 1 initially working up to grade 2-3 per pt tolerance   PATIENT EDUCATION:  Education details: rationale for interventions  Person educated: Patient Education method: Consulting civil engineer, Media planner, Corporate treasurer cues, Verbal cues Education comprehension: verbalized understanding, returned demonstration, verbal cues required, tactile cues required, and needs further education     HOME EXERCISE PROGRAM: Access Code: [email or web address] URL: [email or web address] Date: 10/27/2022 Prepared by: Enis Slipper  Exercises - knee to chest- use  towel to assist  - 1 x daily - 7 x weekly - 1 sets - 3 reps - 5-10 hold - Seated Hip Abduction with Resistance  - 1 x daily - 7 x weekly - 2 sets - 10 reps - Seated Hip Adduction Isometrics with Ball  - 1 x daily - 7 x weekly - 2 sets - 10 reps - Heel Raises with Counter Support  - 1 x daily - 7 x weekly - 2 sets - 10 reps - Seated Sciatic Tensioner  - 1 x daily - 7 x weekly - 2 sets - 10 reps    ASSESSMENT:   CLINICAL IMPRESSION: 11/13/2022 Pt arrives w/ 6/10 pain, no issues after last session. Hasn't been able to see MD yet due to co-pay. Today omitted manual as pt endorses no significant relief from it at or after last session, instead focusing on increasing volume with open and closed chain strengthening. Pt tolerates relatively well, variable and transient increases in pain as session goes on with peak at 8/10 on NPS, improves with nerve glides at end of  session. No adverse events, pain ranging 4-8/10 throughout. Pt departs today's session in no acute distress, all voiced questions/concerns addressed appropriately from PT perspective.        OBJECTIVE IMPAIRMENTS: Abnormal gait, decreased activity tolerance, decreased endurance, decreased mobility, difficulty walking, decreased ROM, decreased strength, impaired perceived functional ability, improper body mechanics, postural dysfunction, and pain.    ACTIVITY LIMITATIONS: carrying, lifting, bending, sitting, standing, squatting, sleeping, stairs, transfers, and locomotion level   PARTICIPATION LIMITATIONS: meal prep, cleaning, laundry, driving, community activity, and occupation   PERSONAL FACTORS: 3+ comorbidities: HTN, CHF, CVA, OSA, DM2, multiple MSK issues, depression  are also affecting patient's functional outcome.    REHAB POTENTIAL: Fair given comorbidities and severity of symptoms   CLINICAL DECISION MAKING: Evolving/moderate complexity   EVALUATION COMPLEXITY: Moderate     GOALS: Goals reviewed with patient? No given time constraints    SHORT TERM GOALS: Target date: 10/21/2022 Pt will demonstrate appropriate understanding and performance of initially prescribed HEP in order to facilitate improved independence with management of symptoms.  Baseline: HEP provided on eval 10/20/22: Pt reports adherence w/ HEP Goal status: MET   2. Pt will score less than or equal to 38% on ODI in order to demonstrate improved perception of function due to symptoms.            Baseline: 44%  10/20/22: 46%             Goal status: ONGOING    LONG TERM GOALS: Target date: 11/11/2022 Pt will score less than or equal to 30% on ODI  in order to demonstrate improved perception of functional status due to symptoms.  Baseline: 44% Goal status: INITIAL   2.  Pt will report ability to stand/walk for up to 14mn with less than 2pt increase in resting pain in order to improve functional tolerance.   Baseline: up to 10/10 pain with functional mobility Goal status: INITIAL   3.  Pt will demonstrate hip flexion MMT of 4/5 bilaterally in order to demonstrate improved strength for functional movements.  Baseline: see MMT chart above Goal status: INITIAL   4. Pt will perform >8 repetitions for 30sec STS in order to demonstrate reduced fall risk and improved functional independence. (MCID of 2 repetitions, fall risk cutoff score 12-17 reps)            Baseline: 4.5 repetition  Goal status: INITIAL    5. Pt will demonstrate ability to lift up to 10# with less than 4/10 pain on NPS in order to demonstrate improved tolerance to household activities.             Baseline: NT due to symptom irritability, pt reports up to 10/10 pain with lifting            Goal status: INITIAL   PLAN:   PT FREQUENCY: 1-2x/week   PT DURATION: 6 weeks   PLANNED INTERVENTIONS: Therapeutic exercises, Therapeutic activity, Neuromuscular re-education, Balance training, Gait training, Patient/Family education, Self Care, Joint mobilization, Stair training, Aquatic Therapy, Dry Needling, Electrical stimulation, Spinal mobilization, Cryotherapy, Moist heat, Taping, Manual therapy, and Re-evaluation.   PLAN FOR NEXT SESSION: manual as indicated. Continue w/ hip + low back strengthening as able/appropriate        Leeroy Cha PT, DPT 11/13/2022 9:37 AM

## 2022-11-13 ENCOUNTER — Encounter: Payer: Self-pay | Admitting: Physical Therapy

## 2022-11-13 ENCOUNTER — Ambulatory Visit: Payer: Medicaid Other | Admitting: Physical Therapy

## 2022-11-13 DIAGNOSIS — M5459 Other low back pain: Secondary | ICD-10-CM

## 2022-11-13 DIAGNOSIS — M25552 Pain in left hip: Secondary | ICD-10-CM

## 2022-11-13 DIAGNOSIS — M6281 Muscle weakness (generalized): Secondary | ICD-10-CM

## 2022-11-13 DIAGNOSIS — R2689 Other abnormalities of gait and mobility: Secondary | ICD-10-CM

## 2022-11-18 NOTE — Therapy (Signed)
OUTPATIENT PHYSICAL THERAPY TREATMENT NOTE   Patient Name: Cassie Mata MRN: [id number] DOB:Mar 13, 1966, 57 y.o., female Today's Date: 11/20/2022  PCP: Deborah Chalk, FNP   REFERRING PROVIDER: Pedro Earls, MD  END OF SESSION:   PT End of Session - 11/20/22 0840     Visit Number 12    Number of Visits 13    Date for PT Re-Evaluation 11/25/22    Authorization Type [employer] MCD    Authorization Time Period no auth    Authorization - Visit Number 12    Authorization - Number of Visits 27   VL   PT Start Time 0844    PT Stop Time 0924    PT Time Calculation (min) 40 min    Activity Tolerance Patient tolerated treatment well    Behavior During Therapy [facility] for tasks assessed/performed               Past Medical History:  Diagnosis Date   Arthritis    Asthma    Cataract    NS OU   CHF (congestive heart failure) ([place])    Coma ([place])    Depression    Diabetes mellitus    Diabetic retinopathy ([place])    OU   Fibromyalgia    Gall stone    Gastroesophageal reflux disease    Hiatal hernia    Insomnia 08/07/2014   Myalgia and myositis, unspecified 01/13/2013   Stroke ([place]) 2007   Pt. evaluated at [facility] had left sided weakness.  Reportedly no cerebral imaging ever done.  Records we have received from [facility] dated 2011 do not document this.  Rest of records in storage and have not yet sent.   Past Surgical History:  Procedure Laterality Date   CESAREAN SECTION  [phone]   INNER EAR SURGERY     TUBAL LIGATION     Patient Active Problem List   Diagnosis Date Noted   Moderate persistent asthma without complication [id number]   Seasonal and perennial allergic rhinitis 04/24/2022   Allergic conjunctivitis of both eyes 04/24/2022   Osteoarthritis of hand 06/24/2021   Pain in left hand 06/04/2021   Paget's bone disease 06/04/2021   Constipation    Vomiting in adult 03/22/2021   AKI (acute kidney injury) ([place]) 03/22/2021    Metacarpal bone fracture 03/13/2021   Cerebrovascular accident (CVA) ([place]) [id number]   Chronic systolic congestive heart failure ([place]) 11/08/2020   OSA (obstructive sleep apnea) 11/08/2020   Tobacco abuse, in remission 11/08/2020   Diabetic cataract of both eyes ([place]) 10/18/2020   Proliferative diabetic retinopathy of both eyes with macular edema associated with type 2 diabetes mellitus ([place]) 10/18/2020   Vitamin D deficiency 12/20/2019   Diabetes mellitus type II, uncontrolled 08/08/2015   Essential hypertension 08/08/2015   GERD (gastroesophageal reflux disease) 08/08/2015   Chronic low back pain with sciatica 08/08/2015   Arthritis 08/08/2015   Type 2 diabetes mellitus ([place]) 08/08/2015   Depression 06/15/2015   Insomnia 08/07/2014   Myalgia and myositis 01/13/2013    REFERRING DIAG: M54.50 (ICD-10-CM) - Low back pain, unspecified  THERAPY DIAG:  Other low back pain  Pain in left hip  Muscle weakness (generalized)  Other abnormalities of gait and mobility  Rationale for Evaluation and Treatment Rehabilitation  PERTINENT HISTORY:  HTN, CHF, CVA, OSA, GERD, DM2, multiple MSK issues, depression, CKD3, fibromyalgia  PRECAUTIONS: fall risk, cardiac hx  SUBJECTIVE:  SUBJECTIVE STATEMENT:   Pt denies any significant change in symptoms after last session. No pain at present. States she feels she is improving, still has some difficulty with lifting. Stairs improving but still irritating. Has not been able to see provider yet due to reported insurance issues.    PAIN:  Are you having pain: yes  0/10 Location: low back and L groin How would you describe your pain? Dull, aching Best in past week: 0/10 (5/10 on eval) Worst in past week: 10/10 Aggravating factors: picking up from floor, stairs (going  up especially), transfers, prolonged standing/walking, sitting 10-30 min, lying on left side Easing factors: changing positions, self massage, pain cream   OBJECTIVE: (objective measures completed at initial evaluation unless otherwise dated)   DIAGNOSTIC FINDINGS:  No recent spine imaging in [facility] - pt states she had imaging of back and L hip after fall, states she thinks it showed a spur in her hip, states she does not think it showed any fracture.    PATIENT SURVEYS:  Modified oswestry: 22/50 raw, 44%  10/20/22 23/50 raw, 46%    SCREENING FOR RED FLAGS: Unremarkable red flag screening   COGNITION: Overall cognitive status: Within functional limits for tasks assessed                          SENSATION/NEURO: Light touch intact BLE No clonus No ataxia with gait     POSTURE: fwd head, rounded shoulders, reduced lordosis    PALPATION: Unable to test due to time constraints   LUMBAR ROM:    AROM eval 10/06/22 10/20/22 AROM  Flexion   Reaches mid shin , pain   Extension   10 most  painful  10% most painful  Right lateral flexion   Reach mid knee , pain   Left lateral flexion   Reaches 1 inch past knee, pain   Right rotation (seated) 75% Standing 50% pain 75% p!  Left rotation (seated) 75% Standing 75% 75%   (Blank rows = not tested)   LOWER EXTREMITY ROM:      Passive  Right eval Left eval Right 10/06/22 Left 10/06/22  Hip flexion     [facility]  Hip extension        Hip internal rotation     20 25  Hip external rotation     45  40 pain  10/06/22: Hamstring length: R 90, L 75 from hooklying  (Blank rows = not tested) (Key: WFL = within functional limits not formally assessed, * = concordant pain, s = stiffness/stretching sensation, NT = not tested)  Comments:     LOWER EXTREMITY MMT:     MMT Right eval Left eval  Hip flexion 4 3+ *  Hip abduction (modified sitting) 5 5  Hip internal rotation      Hip external rotation      Knee flexion 4 4  Knee extension 4 4     (Blank rows = not tested) (Key: WFL = within functional limits not formally assessed, * = concordant pain, s = stiffness/stretching sensation, NT = not tested)  Comments: hip rotation MMT deferred due to symptom irritability     FUNCTIONAL TESTS:  30sec STS 4.5 repetitions heavy UE support and increasing back pain  11/07/22 30sec STS: 9 repetitions moderate UE support from standard chair, thigh soreness no extra back pain    GAIT: Distance walked: with clinic Assistive device utilized: None Level of assistance: Complete Independence Comments:  wide BOS, reduced step length, reduced trunk rotation B, antalgic gait on L    TODAY'S TREATMENT:                                                                                               [facility] Adult PT Treatment:                                                DATE: 11/20/22 Therapeutic Exercise: GTB paloff press 2x12 B cues for form, posture, and HEP setup Sidestepping at counter gentle UE support 3 laps cues for step length  Posterior pelvic tilt, hooklying, cues for form and breath control 2x10 Mini bridge 2x5 cues for form and breath control  Sciatic nerve glides x12 LLE cues for setup HEP update/handout/education  [facility] Adult PT Treatment:                                                DATE: 11/13/22 Therapeutic Exercise: RTB IR with ball as fulcrum 2x10 cues for setup RTB ER w/ ball as fulcrum 2x10 cues for setup Heel/toe raises 2x12 cues for reduced compensations w trunk  Hooklying shoulder extension pull downs RTB 2x10 cues for core contraction and pacing Seated swiss ball press down x12 cues for breath control Cable column paloff press 7# x8 B cues for form and posture  Sciatic nerve glides x10 B LE    [facility] Adult PT Treatment:                                                DATE: 11/07/22 Therapeutic Exercise: Heel raises 2x15 cues for upright posture and pacing  2inch step up LLE only w/ RTB for TKE 2x8 cues for form and  posture Seated RTB hip ER w/ ball as fulcrum x10 cues for form and posture  LLE sciatic nerve glides x12   Manual Therapy: Sidelying w/ pillow between knees: gentle STM beginning at distal L QL and proximal glute attachment working through glutes/piriformis towards greater trochanter, gentle trigger point release glute med Supine: Gentle SAD L hip joint grade 1-3  Therapeutic Activity: 30sec STS + education 4 inch step up 2x5 B LE cues for pacing and mechanics     PATIENT EDUCATION:  Education details: rationale for interventions, HEP update Person educated: Patient Education method: Explanation, Demonstration, Tactile cues, Verbal cues Education comprehension: verbalized understanding, returned demonstration, verbal cues required, tactile cues required, and needs further education     HOME EXERCISE PROGRAM: Access Code: [email or web address] URL: [email or web address] Date: 11/20/2022 Prepared by: Enis Slipper  Exercises - knee to chest- use towel to assist  - 1 x daily - 7 x weekly - 1 sets - 3 reps - 5-10  hold - Seated Hip Abduction with Resistance  - 1 x daily - 7 x weekly - 2 sets - 10 reps - Seated Hip Adduction Isometrics with Ball  - 1 x daily - 7 x weekly - 2 sets - 10 reps - Heel Raises with Counter Support  - 1 x daily - 7 x weekly - 2 sets - 10 reps - Seated Sciatic Tensioner  - 1 x daily - 7 x weekly - 2 sets - 10 reps - Supine Posterior Pelvic Tilt  - 1 x daily - 7 x weekly - 2 sets - 10 reps - Standing Anti-Rotation Press with Anchored Resistance  - 1 x daily - 7 x weekly - 2 sets - 10 reps    ASSESSMENT:   CLINICAL IMPRESSION: 11/20/2022 Pt arrives w/o pain, continues to endorse some difficulty w/ lifting and stairs but overall improving compared to start of PT and minimal limitations in daily activities reported. Pt states she would like to discharge next session and focus on independent management of symptoms - given this, today focusing on maximizing  independence and self management of symptoms w/ exercises able to be performed at home. HEP updated as above. Good tolerance, denies any increase in resting pain throughout (some mild discomfort during activity that tends to improve w/ repetition). No adverse events, some soreness on departure. Pt departs today's session in no acute distress, all voiced questions/concerns addressed appropriately from PT perspective.        OBJECTIVE IMPAIRMENTS: Abnormal gait, decreased activity tolerance, decreased endurance, decreased mobility, difficulty walking, decreased ROM, decreased strength, impaired perceived functional ability, improper body mechanics, postural dysfunction, and pain.    ACTIVITY LIMITATIONS: carrying, lifting, bending, sitting, standing, squatting, sleeping, stairs, transfers, and locomotion level   PARTICIPATION LIMITATIONS: meal prep, cleaning, laundry, driving, community activity, and occupation   PERSONAL FACTORS: 3+ comorbidities: HTN, CHF, CVA, OSA, DM2, multiple MSK issues, depression  are also affecting patient's functional outcome.    REHAB POTENTIAL: Fair given comorbidities and severity of symptoms   CLINICAL DECISION MAKING: Evolving/moderate complexity   EVALUATION COMPLEXITY: Moderate     GOALS: Goals reviewed with patient? No given time constraints    SHORT TERM GOALS: Target date: 10/21/2022 Pt will demonstrate appropriate understanding and performance of initially prescribed HEP in order to facilitate improved independence with management of symptoms.  Baseline: HEP provided on eval 10/20/22: Pt reports adherence w/ HEP Goal status: MET   2. Pt will score less than or equal to 38% on ODI in order to demonstrate improved perception of function due to symptoms.            Baseline: 44%  10/20/22: 46%             Goal status: ONGOING    LONG TERM GOALS: Target date: 11/11/2022 Pt will score less than or equal to 30% on ODI  in order to demonstrate improved  perception of functional status due to symptoms.  Baseline: 44% Goal status: INITIAL   2.  Pt will report ability to stand/walk for up to 55mn with less than 2pt increase in resting pain in order to improve functional tolerance.  Baseline: up to 10/10 pain with functional mobility Goal status: INITIAL   3.  Pt will demonstrate hip flexion MMT of 4/5 bilaterally in order to demonstrate improved strength for functional movements.  Baseline: see MMT chart above Goal status: INITIAL   4. Pt will perform >8 repetitions for 30sec STS in order to demonstrate reduced  fall risk and improved functional independence. (MCID of 2 repetitions, fall risk cutoff score 12-17 reps)            Baseline: 4.5 repetition            Goal status: INITIAL    5. Pt will demonstrate ability to lift up to 10# with less than 4/10 pain on NPS in order to demonstrate improved tolerance to household activities.             Baseline: NT due to symptom irritability, pt reports up to 10/10 pain with lifting            Goal status: INITIAL   PLAN:   PT FREQUENCY: 1-2x/week   PT DURATION: 6 weeks   PLANNED INTERVENTIONS: Therapeutic exercises, Therapeutic activity, Neuromuscular re-education, Balance training, Gait training, Patient/Family education, Self Care, Joint mobilization, Stair training, Aquatic Therapy, Dry Needling, Electrical stimulation, Spinal mobilization, Cryotherapy, Moist heat, Taping, Manual therapy, and Re-evaluation.   PLAN FOR NEXT SESSION: tentative discharge to HEP      Leeroy Cha PT, DPT 11/20/2022 10:59 AM

## 2022-11-20 ENCOUNTER — Encounter: Payer: Self-pay | Admitting: Physical Therapy

## 2022-11-20 ENCOUNTER — Ambulatory Visit: Payer: Medicaid Other | Admitting: Physical Therapy

## 2022-11-20 DIAGNOSIS — M6281 Muscle weakness (generalized): Secondary | ICD-10-CM

## 2022-11-20 DIAGNOSIS — M5459 Other low back pain: Secondary | ICD-10-CM

## 2022-11-20 DIAGNOSIS — R2689 Other abnormalities of gait and mobility: Secondary | ICD-10-CM

## 2022-11-20 DIAGNOSIS — M25552 Pain in left hip: Secondary | ICD-10-CM

## 2022-11-24 ENCOUNTER — Encounter: Payer: Self-pay | Admitting: Physical Therapy

## 2022-11-24 ENCOUNTER — Ambulatory Visit: Payer: Medicaid Other | Admitting: Physical Therapy

## 2022-11-24 DIAGNOSIS — R2689 Other abnormalities of gait and mobility: Secondary | ICD-10-CM

## 2022-11-24 DIAGNOSIS — M25552 Pain in left hip: Secondary | ICD-10-CM

## 2022-11-24 DIAGNOSIS — M6281 Muscle weakness (generalized): Secondary | ICD-10-CM

## 2022-11-24 DIAGNOSIS — M5459 Other low back pain: Secondary | ICD-10-CM | POA: Diagnosis not present

## 2022-11-24 NOTE — Therapy (Signed)
OUTPATIENT PHYSICAL THERAPY TREATMENT NOTE + DISCHARGE SUMMARY    Patient Name: Cassie Mata MRN: [id number] DOB:07/01/1966, 57 y.o., female Today's Date: 11/24/2022   PHYSICAL THERAPY DISCHARGE SUMMARY  Visits from Start of Care: 13  Current functional level related to goals / functional outcomes: Able to perform majority of daily activities/mobility with increased pain   Remaining deficits: Pain; difficulty lifting; difficulty with stair navigation   Education / Equipment: HEP, discharge education as below, following up with provider  Patient agrees to discharge. Patient goals were partially met. Patient is being discharged due to the patient's request.    PCP: Deborah Chalk, FNP   REFERRING PROVIDER: Pedro Earls, MD  END OF SESSION:   PT End of Session - 11/24/22 0936     Visit Number 13    Number of Visits 13    Date for PT Re-Evaluation 11/25/22    Authorization Type [employer] MCD    Authorization Time Period no auth    Authorization - Visit Number 13    Authorization - Number of Visits 27   VL   PT Start Time 0936    PT Stop Time 1015    PT Time Calculation (min) 39 min    Activity Tolerance Patient tolerated treatment well    Behavior During Therapy WFL for tasks assessed/performed                Past Medical History:  Diagnosis Date   Arthritis    Asthma    Cataract    NS OU   CHF (congestive heart failure) ([place])    Coma ([place])    Depression    Diabetes mellitus    Diabetic retinopathy ([place])    OU   Fibromyalgia    Gall stone    Gastroesophageal reflux disease    Hiatal hernia    Insomnia 08/07/2014   Myalgia and myositis, unspecified 01/13/2013   Stroke ([place]) 2007   Pt. evaluated at [facility] had left sided weakness.  Reportedly no cerebral imaging ever done.  Records we have received from [facility] dated 2011 do not document this.  Rest of records in storage and have not yet sent.   Past Surgical History:   Procedure Laterality Date   CESAREAN SECTION  [phone]   INNER EAR SURGERY     TUBAL LIGATION     Patient Active Problem List   Diagnosis Date Noted   Moderate persistent asthma without complication [id number]   Seasonal and perennial allergic rhinitis 04/24/2022   Allergic conjunctivitis of both eyes 04/24/2022   Osteoarthritis of hand 06/24/2021   Pain in left hand 06/04/2021   Paget's bone disease 06/04/2021   Constipation    Vomiting in adult 03/22/2021   AKI (acute kidney injury) ([place]) 03/22/2021   Metacarpal bone fracture 03/13/2021   Cerebrovascular accident (CVA) ([place]) [id number]   Chronic systolic congestive heart failure ([place]) 11/08/2020   OSA (obstructive sleep apnea) 11/08/2020   Tobacco abuse, in remission 11/08/2020   Diabetic cataract of both eyes ([place]) 10/18/2020   Proliferative diabetic retinopathy of both eyes with macular edema associated with type 2 diabetes mellitus ([place]) 10/18/2020   Vitamin D deficiency 12/20/2019   Diabetes mellitus type II, uncontrolled 08/08/2015   Essential hypertension 08/08/2015   GERD (gastroesophageal reflux disease) 08/08/2015   Chronic low back pain with sciatica 08/08/2015   Arthritis 08/08/2015   Type 2 diabetes mellitus ([place]) 08/08/2015   Depression 06/15/2015   Insomnia 08/07/2014   Myalgia and  myositis 01/13/2013    REFERRING DIAG: M54.50 (ICD-10-CM) - Low back pain, unspecified  THERAPY DIAG:  Other low back pain  Pain in left hip  Muscle weakness (generalized)  Other abnormalities of gait and mobility  Rationale for Evaluation and Treatment Rehabilitation  PERTINENT HISTORY:  HTN, CHF, CVA, OSA, GERD, DM2, multiple MSK issues, depression, CKD3, fibromyalgia  PRECAUTIONS: fall risk, cardiac hx  SUBJECTIVE:                                                                                                                                                                                      SUBJECTIVE  STATEMENT:   Pt states she has an appt with MD Thursday. No pain at present. States she still has some significant pain at times but overall less limitations in daily activities, states she would like to discharge today and manage symptoms independently w/ exercise.   PAIN:  Are you having pain: yes  0/10 Location: low back and L groin How would you describe your pain? Dull, aching Best in past week: 0/10 (5/10 on eval) Worst in past week: 7/10 (10/10 on eval) Aggravating factors: stairs, picking up heavy items, lying on side Easing factors: changing positions, self massage, pain cream   OBJECTIVE: (objective measures completed at initial evaluation unless otherwise dated)   DIAGNOSTIC FINDINGS:  No recent spine imaging in [facility] - pt states she had imaging of back and L hip after fall, states she thinks it showed a spur in her hip, states she does not think it showed any fracture.    PATIENT SURVEYS:  Modified oswestry: 22/50 raw, 44%  10/20/22 23/50 raw, 46%  11/24/22: 19/50 raw, 38%    SCREENING FOR RED FLAGS: Unremarkable red flag screening   COGNITION: Overall cognitive status: Within functional limits for tasks assessed                          SENSATION/NEURO: Light touch intact BLE No clonus No ataxia with gait     POSTURE: fwd head, rounded shoulders, reduced lordosis      LUMBAR ROM:    AROM eval 10/06/22 10/20/22 AROM 11/24/22 AROM  Flexion   Reaches mid shin , pain    Extension   10 most  painful  10% most painful   Right lateral flexion   Reach mid knee , pain    Left lateral flexion   Reaches 1 inch past knee, pain    Right rotation (seated) 75% Standing 50% pain 75% p! 75% P!  Left rotation (seated) 75% Standing 75% 75% 75%    (Blank rows = not tested)  LOWER EXTREMITY ROM:      Passive  Right eval Left eval Right 10/06/22 Left 10/06/22  Hip flexion     [facility]  Hip extension        Hip internal rotation     20 25  Hip external rotation     45  40  pain  10/06/22: Hamstring length: R 90, L 75 from hooklying  (Blank rows = not tested) (Key: WFL = within functional limits not formally assessed, * = concordant pain, s = stiffness/stretching sensation, NT = not tested)  Comments:     LOWER EXTREMITY MMT:     MMT Right eval Left eval R/L 11/24/22  Hip flexion 4 3+ * 4/4- p!  Hip abduction (modified sitting) 5 5   Hip internal rotation       Hip external rotation       Knee flexion 4 4   Knee extension 4 4     (Blank rows = not tested) (Key: WFL = within functional limits not formally assessed, * = concordant pain, s = stiffness/stretching sensation, NT = not tested)  Comments: hip rotation MMT deferred due to symptom irritability     FUNCTIONAL TESTS:  30sec STS 4.5 repetitions heavy UE support and increasing back pain  11/07/22 30sec STS: 9 repetitions moderate UE support from standard chair, thigh soreness no extra back pain  11/24/22: 7 repetitions w/gentle UE support weaned for middle three reps, increased pain.  10 repetitions w/ gentle UE support, no increase in pain.    GAIT: Distance walked: with clinic Assistive device utilized: None Level of assistance: Complete Independence Comments: wide BOS, reduced step length, reduced trunk rotation B, antalgic gait on L    TODAY'S TREATMENT:                                                                                              [facility] Adult PT Treatment:                                                DATE: 11/24/22 Therapeutic Exercise: Adduction iso x10 BTB seated abduction x10  Sciatic nerve glides x5 BIL Heel raises x12 at counter PPT x10  HEP education + handout, blue band provided and education on band selection   Therapeutic Activity: ODI + education  MSK assessment + education 30sec STS + education Lifting assessment (5# and 10# KB from floor) + education Discharge education (monitoring symptoms, activity modification, follow up with provider)   PATIENT  EDUCATION:  Education details: rationale for interventions, HEP update, discharge education, following up with provider Person educated: Patient Education method: Explanation, Demonstration, Tactile cues, Verbal cues, handout Education comprehension: verbalized understanding, returned demonstration, verbal cues required, tactile cues required, and needs further education     HOME EXERCISE PROGRAM: Access Code: [email or web address] URL: [email or web address] Date: 11/24/2022 Prepared by: Enis Slipper  Exercises - Seated Hip Abduction with Resistance  - 1 x daily - 7 x weekly - 2 sets -  10 reps - Seated Hip Adduction Isometrics with Ball  - 1 x daily - 7 x weekly - 2 sets - 10 reps - Heel Raises with Counter Support  - 1 x daily - 7 x weekly - 2 sets - 10 reps - Seated Sciatic Tensioner  - 1 x daily - 7 x weekly - 2 sets - 10 reps - Supine Posterior Pelvic Tilt  - 1 x daily - 7 x weekly - 2 sets - 10 reps - Standing Anti-Rotation Press with Anchored Resistance  - 1 x daily - 7 x weekly - 2 sets - 10 reps    ASSESSMENT:   CLINICAL IMPRESSION: 11/24/2022 Pt arrives w/o pain - states she continues to have fairly significant pain at times, mostly with stairs and heavy lifting, but overall improved tolerance compared to start of therapy. States she would like to discharge today, follows up with referring provider later this week. Pt has met all short term goals, has met 1/5 long term goals but progressed towards remainder of goals (see below). Improvements in LE strength and functional measures as above, 30sec STS time remains indicative of fall risk but much improved compared to start of therapy. Reviewed HEP which pt performs well in clinic without cues, did provide with blue theraband and discussed appropriate band selection for familiar exercises. After discussion re: progress with PT thus far, pt elects for discharge on this date. Education provided on monitoring symptoms, discussed further  discussion with referring provider re: persisting symptoms/limitations and possible benefit of further therapy pending symptom trajectory and pt management of symptoms independently. Pt verbalizes agreement/understanding, denies any concerns/questions re: discharge planning. Departs session without any pain, no adverse events. Will discharge to independent HEP at this time per pt preference.     OBJECTIVE IMPAIRMENTS: Abnormal gait, decreased activity tolerance, decreased endurance, decreased mobility, difficulty walking, decreased ROM, decreased strength, impaired perceived functional ability, improper body mechanics, postural dysfunction, and pain.    ACTIVITY LIMITATIONS: carrying, lifting, bending, sitting, standing, squatting, sleeping, stairs, transfers, and locomotion level   PARTICIPATION LIMITATIONS: meal prep, cleaning, laundry, driving, community activity, and occupation   PERSONAL FACTORS: 3+ comorbidities: HTN, CHF, CVA, OSA, DM2, multiple MSK issues, depression  are also affecting patient's functional outcome.    REHAB POTENTIAL: Fair given comorbidities and severity of symptoms   CLINICAL DECISION MAKING: Evolving/moderate complexity   EVALUATION COMPLEXITY: Moderate     GOALS: Goals reviewed with patient? No given time constraints    SHORT TERM GOALS: Target date: 10/21/2022 Pt will demonstrate appropriate understanding and performance of initially prescribed HEP in order to facilitate improved independence with management of symptoms.  Baseline: HEP provided on eval 10/20/22: Pt reports adherence w/ HEP Goal status: MET   2. Pt will score less than or equal to 38% on ODI in order to demonstrate improved perception of function due to symptoms.            Baseline: 44%  10/20/22: 46%   11/24/22: 38%            Goal status: MET    LONG TERM GOALS: Target date: 11/11/2022 Pt will score less than or equal to 30% on ODI  in order to demonstrate improved perception of functional  status due to symptoms.  Baseline: 44% 11/07/22: 38%  Goal status: NOT MET   2.  Pt will report ability to stand/walk for up to 25min with less than 2pt increase in resting pain in order to improve functional tolerance.  Baseline: up to 10/10 pain with functional mobility 11/24/22: 67min up to 7-8/10  Goal status: NOT MET    3.  Pt will demonstrate hip flexion MMT of 4/5 bilaterally in order to demonstrate improved strength for functional movements.  Baseline: see MMT chart above 11/24/22: 4- painful hip flexion L  Goal status: PROGRESSED, NOT MET    4. Pt will perform >8 repetitions for 30sec STS in order to demonstrate reduced fall risk and improved functional independence. (MCID of 2 repetitions, fall risk cutoff score 12-17 reps)            Baseline: 4.5 repetition  11/24/22: 7 repetitions (middle repetitions without UE support), 10 repetitions w/ gentle UE support            Goal status: MET   5. Pt will demonstrate ability to lift up to 10# with less than 4/10 pain on NPS in order to demonstrate improved tolerance to household activities.             Baseline: NT due to symptom irritability, pt reports up to 10/10 pain with lifting  11/24/22: 5# no pain, 10# with 5/10 pain            Goal status: PROGRESSED, NOT MET   PLAN: DISCHARGE     PT FREQUENCY: NA   PT DURATION:  NA   PLANNED INTERVENTIONS: Therapeutic exercises, Therapeutic activity, Neuromuscular re-education, Balance training, Gait training, Patient/Family education, Self Care, Joint mobilization, Stair training, Aquatic Therapy, Dry Needling, Electrical stimulation, Spinal mobilization, Cryotherapy, Moist heat, Taping, Manual therapy, and Re-evaluation.   PLAN FOR NEXT SESSION: NA - discharge to independent HEP, follow up with provider to discuss next steps      Leeroy Cha PT, DPT 11/24/2022 11:20 AM

## 2022-11-28 DIAGNOSIS — M545 Low back pain, unspecified: Secondary | ICD-10-CM | POA: Diagnosis not present

## 2022-11-28 DIAGNOSIS — M25552 Pain in left hip: Secondary | ICD-10-CM | POA: Diagnosis not present

## 2023-04-11 IMAGING — DX DG HAND COMPLETE 3+V*L*
3 series · 3 of 3 positions shown · non-contrast
Comparison: None.

CLINICAL DATA: 55-year-old female with left hand swelling.

EXAM:
LEFT HAND - COMPLETE 3+ VIEW

[hand pa]
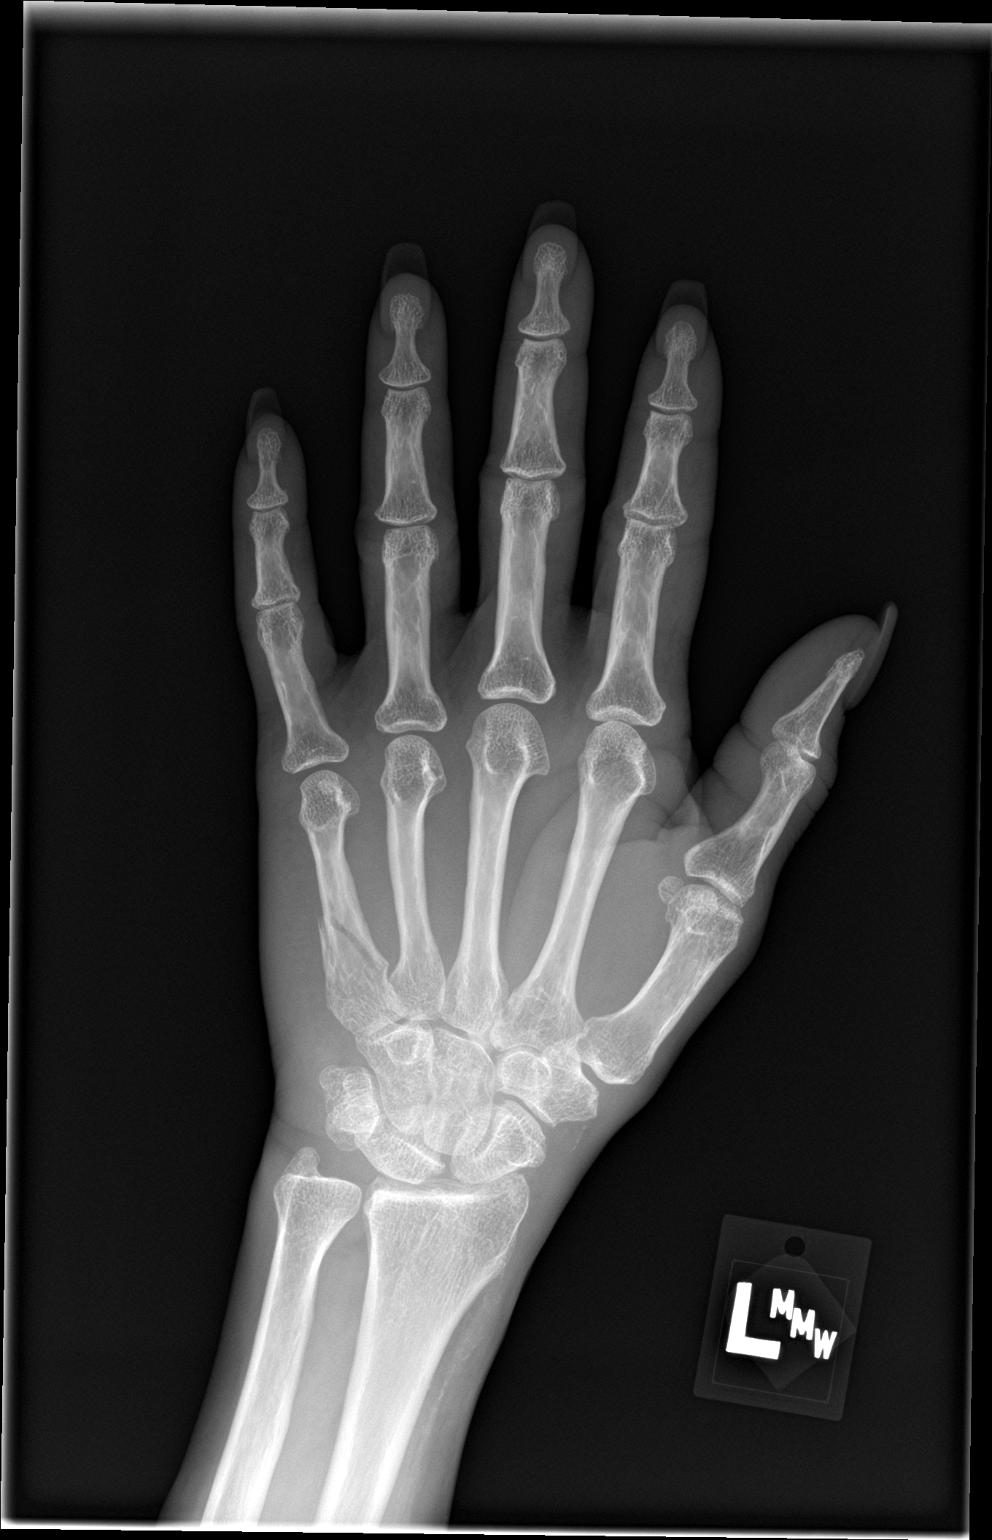

[hand obl]
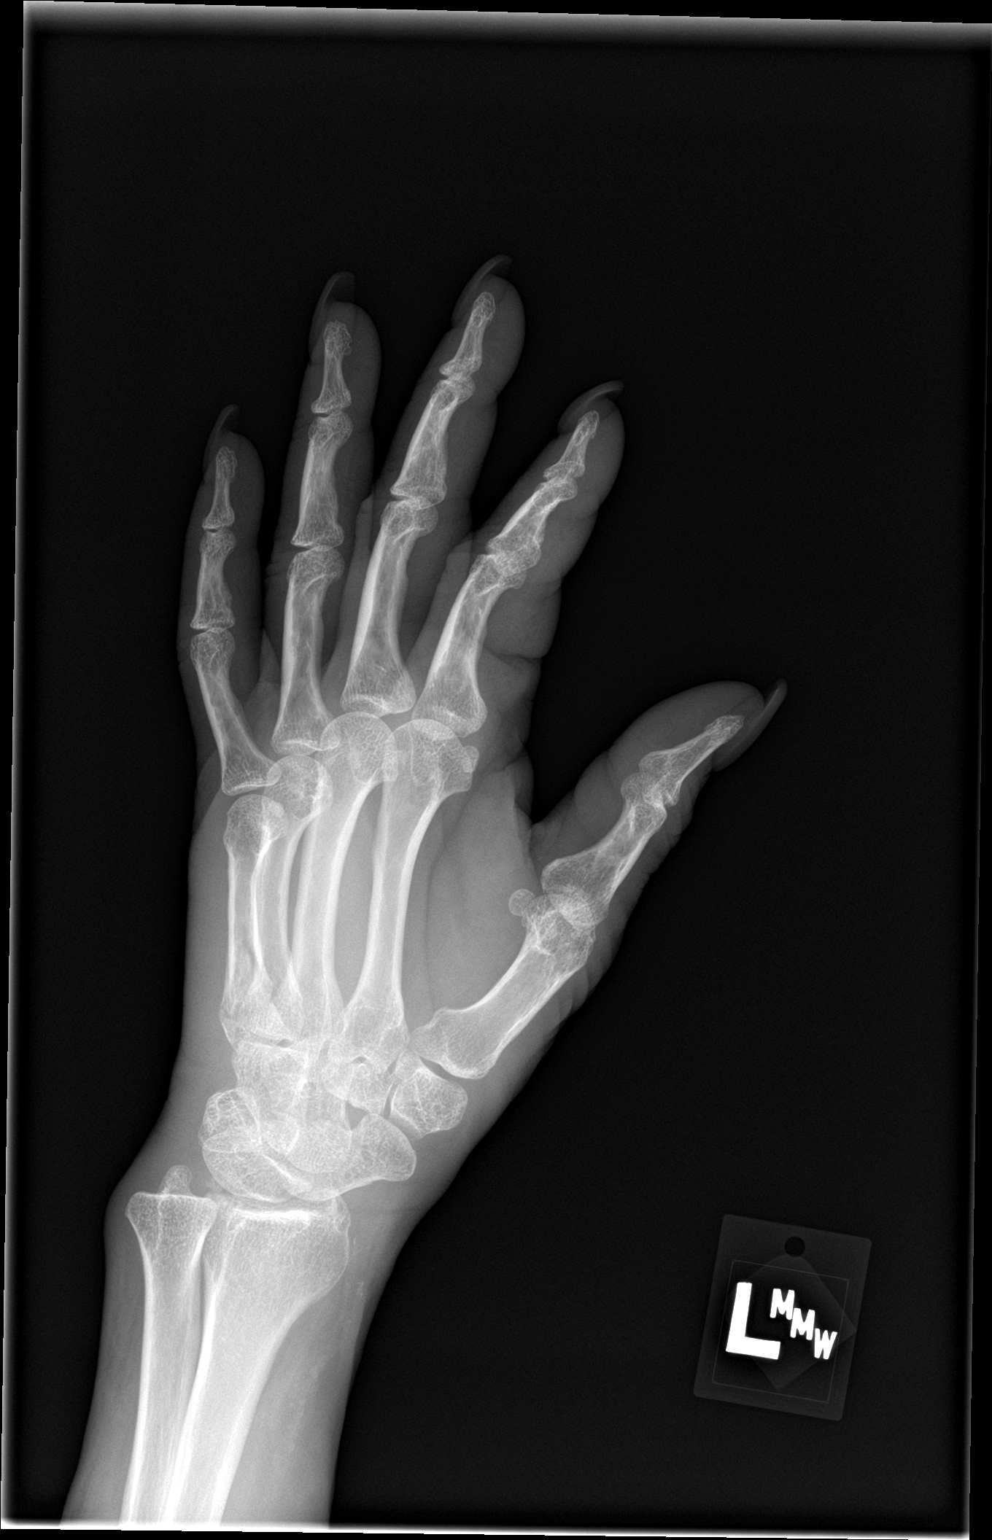

[hand lat]
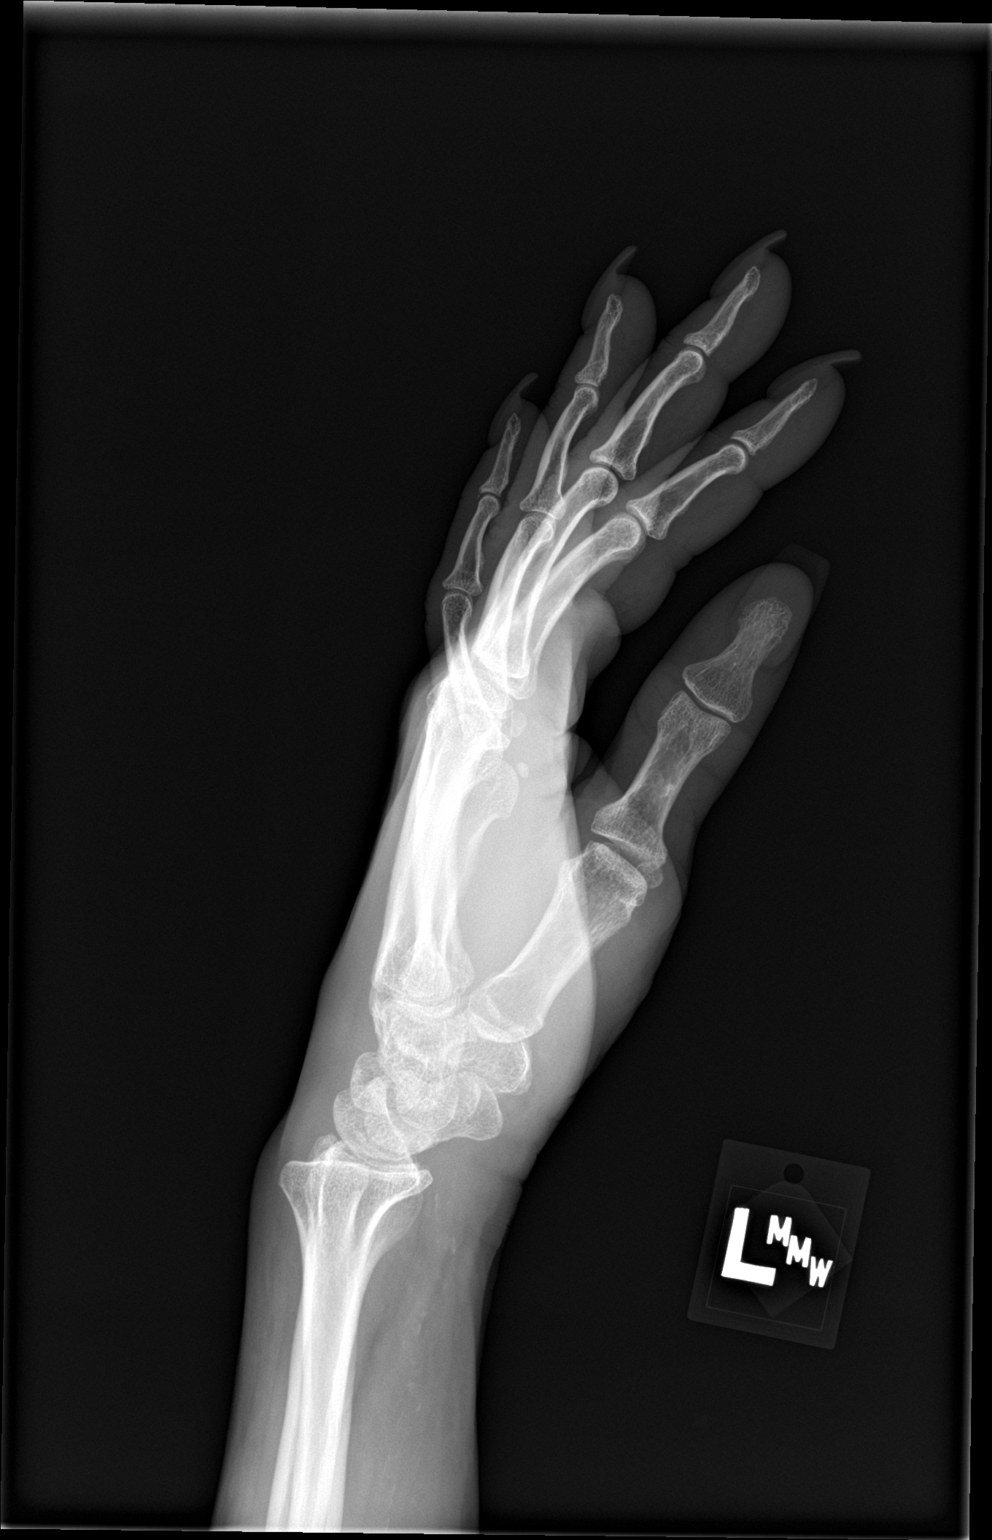

[3 of 3 positions shown; findings below may reference images not displayed]

FINDINGS: Minimally displaced oblique fracture of the proximal fifth
metacarpal. No other acute fracture. The bones are mildly
osteopenic. There is no dislocation. Mild soft tissue swelling over
the fifth metacarpal. No radiopaque foreign object or soft tissue
gas.
IMPRESSION: Minimally displaced oblique fracture of the proximal fifth
metacarpal.

## 2023-04-28 IMAGING — US US ABDOMEN LIMITED
1 series · 14 of 25 positions shown · non-contrast
Comparison: CT abdomen pelvis 03/22/2021

CLINICAL DATA: Nausea

Vomiting
Constipation
EXAM:
ULTRASOUND ABDOMEN LIMITED RIGHT UPPER QUADRANT

[Series 1: us abdomen limited ruq (liver/gb) · 14 of 61 slices shown]
[im 1/61]
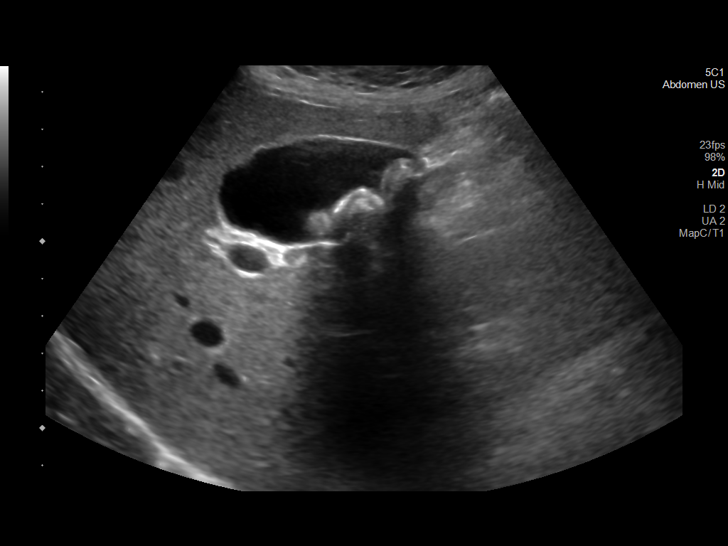
[im 6/61]
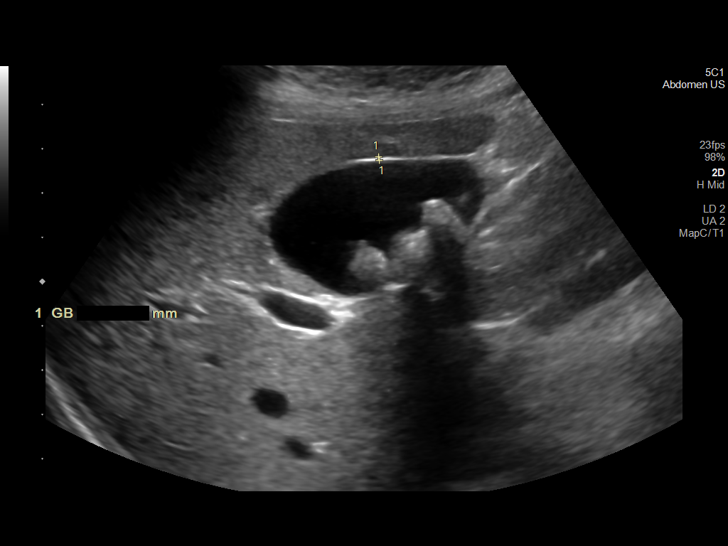
[im 11/61]
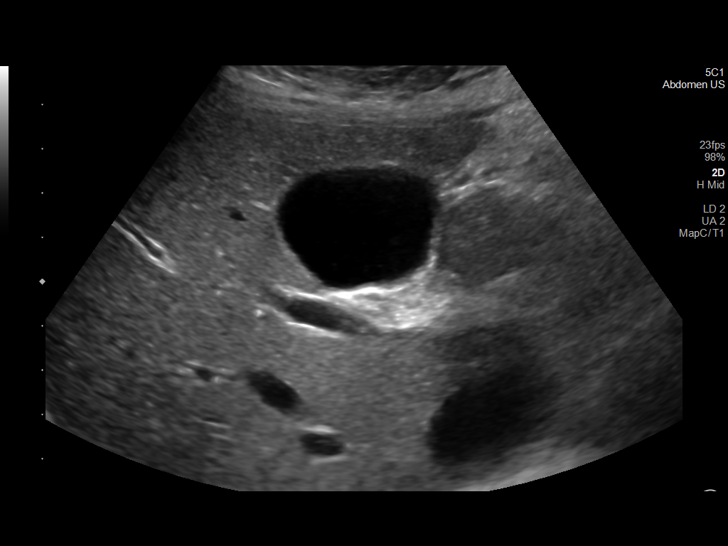
[im 16/61]
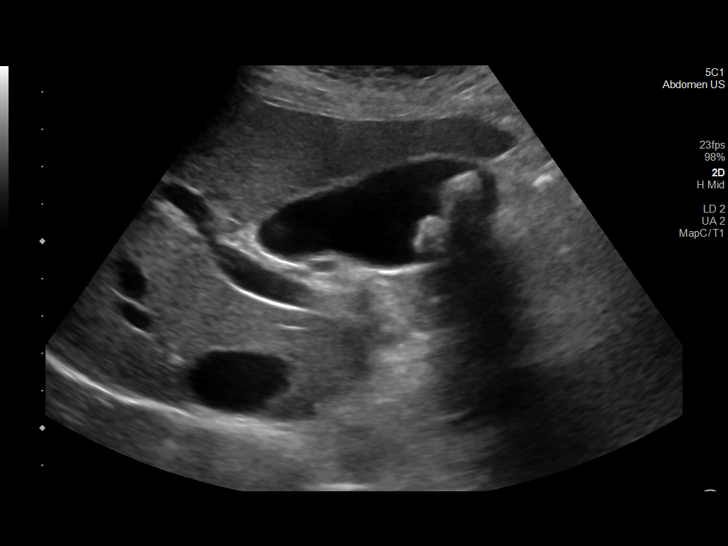
[im 21/61]
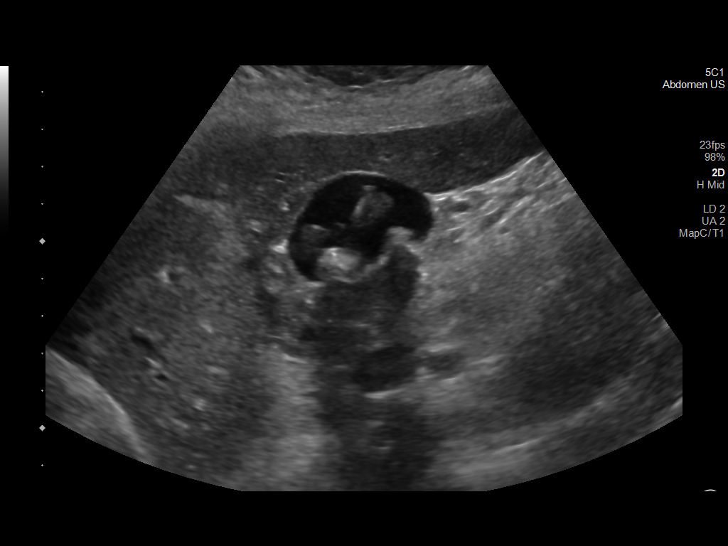
[im 23/61]
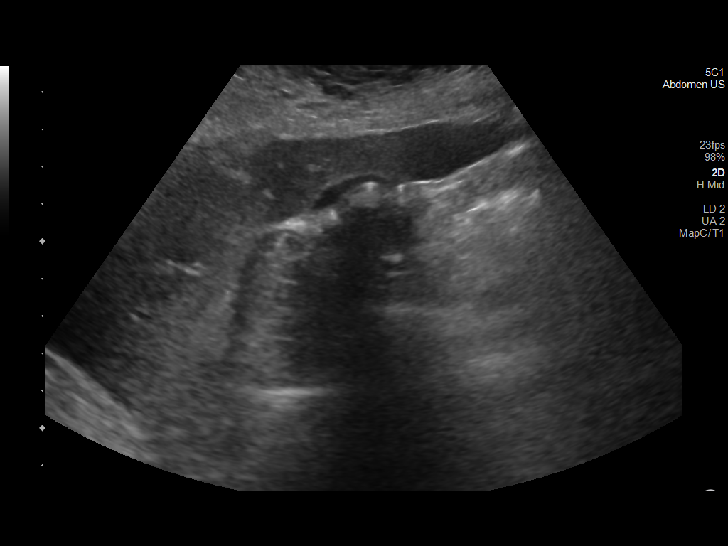
[im 28/61]
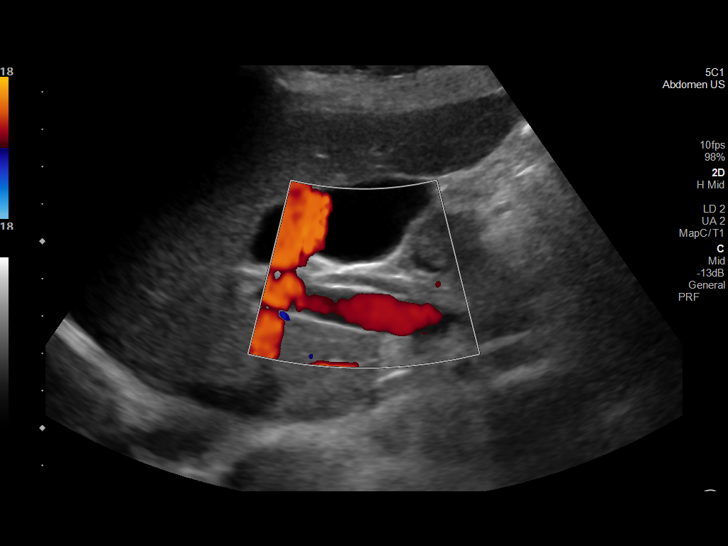
[im 33/61]
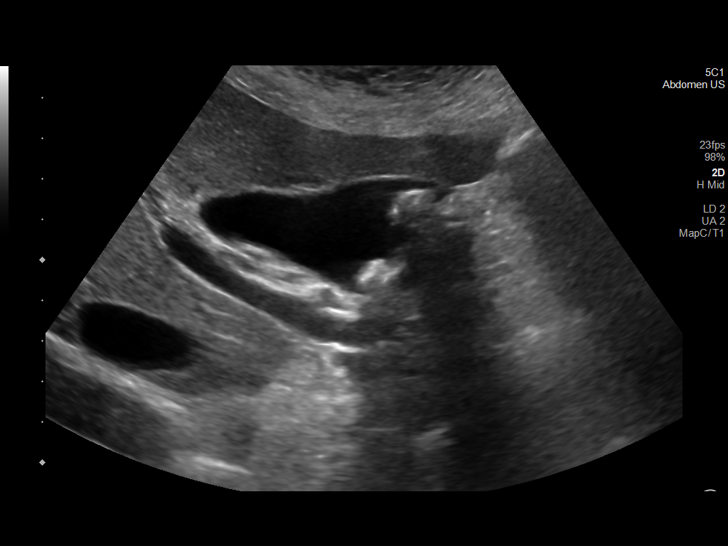
[im 38/61]
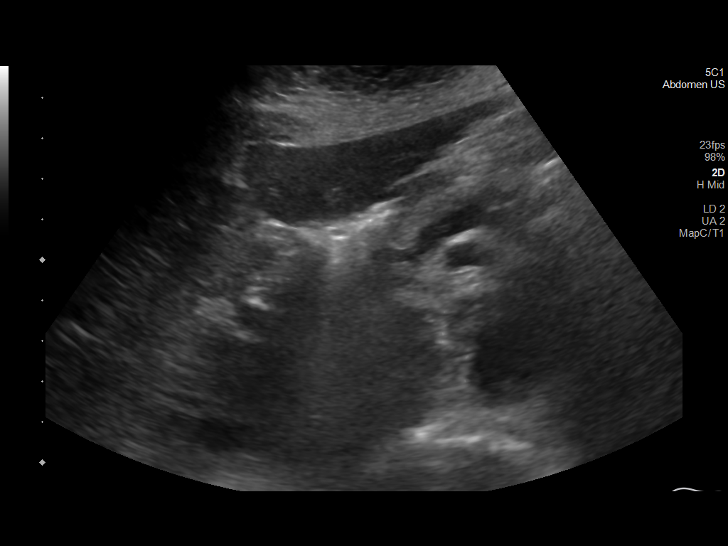
[im 41/61]
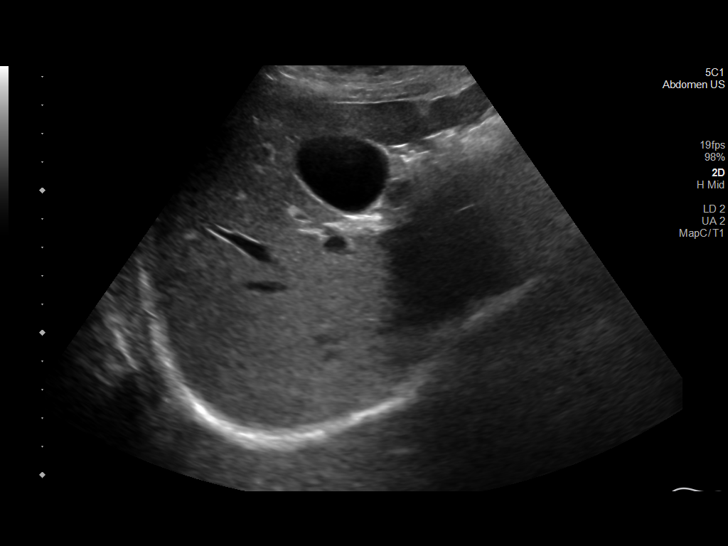
[im 46/61]
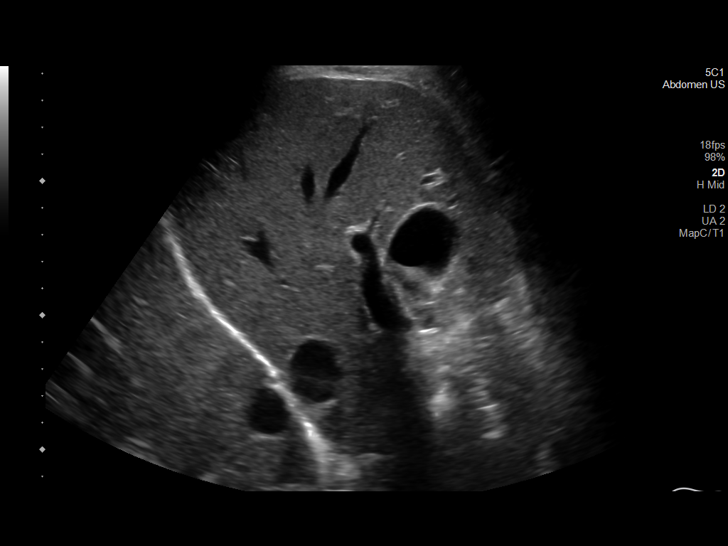
[im 51/61]
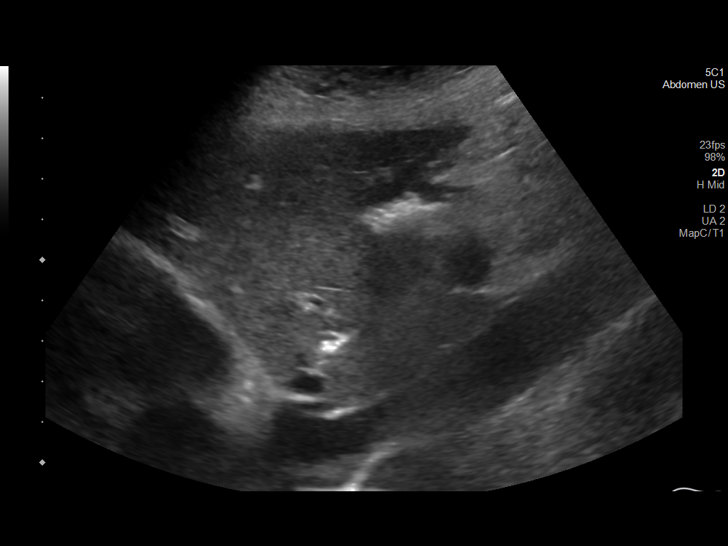
[im 56/61]
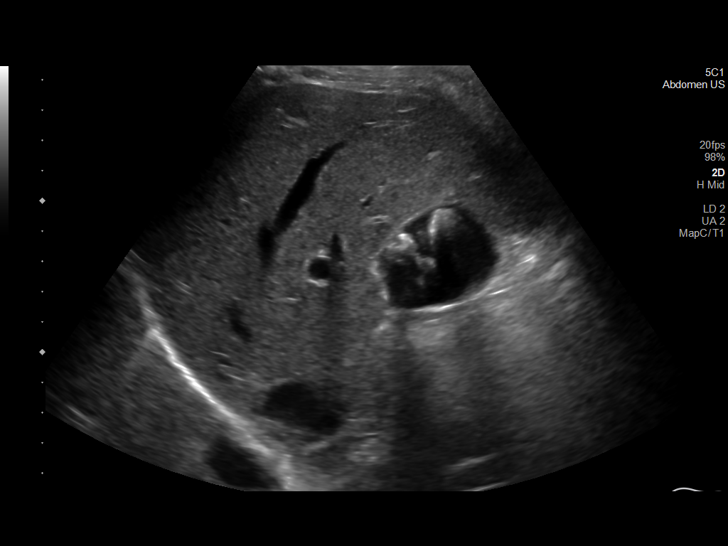
[im 61/61]
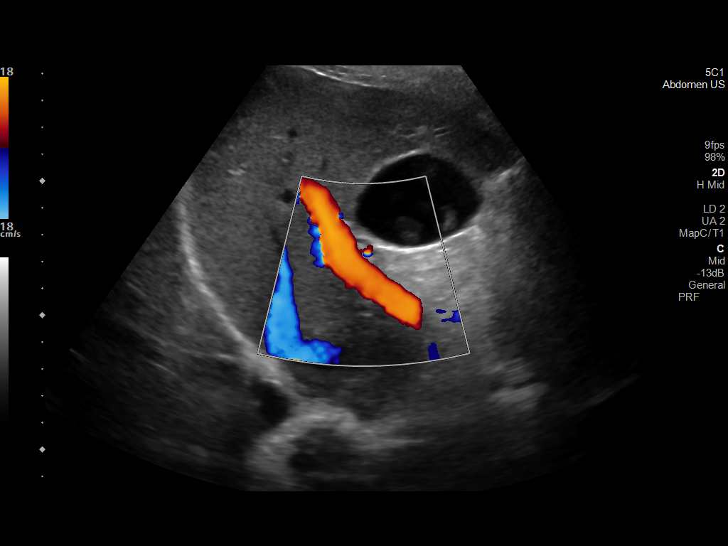

[14 of 25 positions shown; findings below may reference images not displayed]

FINDINGS: Gallbladder:

Cholelithiasis. No gallbladder wall thickening or pericholecystic
fluid. Sonographic Murphy sign is negative per technologist.

Common bile duct:

Diameter: 3 mm

Liver:

No focal lesion identified. Within normal limits in parenchymal
echogenicity. Portal vein is patent on color Doppler imaging with
normal direction of blood flow towards the liver.

Other: None.
IMPRESSION: Moderate cholelithiasis without additional sonographic evidence of
acute cholecystitis.

## 2023-05-08 ENCOUNTER — Other Ambulatory Visit: Payer: Self-pay | Admitting: Sports Medicine

## 2023-05-08 DIAGNOSIS — M25552 Pain in left hip: Secondary | ICD-10-CM

## 2023-06-12 DIAGNOSIS — M25552 Pain in left hip: Secondary | ICD-10-CM | POA: Diagnosis not present

## 2023-06-12 DIAGNOSIS — M545 Low back pain, unspecified: Secondary | ICD-10-CM | POA: Diagnosis not present

## 2023-11-15 ENCOUNTER — Inpatient Hospital Stay (HOSPITAL_COMMUNITY)
Admission: EM | Admit: 2023-11-15 | Discharge: 2023-11-21 | DRG: 683 | Disposition: A | Discharge status: Home / Self Care | Attending: Internal Medicine | Admitting: Internal Medicine

## 2023-11-15 ENCOUNTER — Other Ambulatory Visit: Payer: Self-pay

## 2023-11-15 ENCOUNTER — Emergency Department (HOSPITAL_COMMUNITY): Payer: Self-pay

## 2023-11-15 ENCOUNTER — Emergency Department (HOSPITAL_COMMUNITY)
Admission: EM | Admit: 2023-11-15 | Discharge: 2023-11-15 | Discharge status: Against Medical Advice | Payer: Self-pay | Source: Home / Self Care

## 2023-11-15 ENCOUNTER — Emergency Department (HOSPITAL_COMMUNITY)

## 2023-11-15 ENCOUNTER — Encounter (HOSPITAL_COMMUNITY): Payer: Self-pay | Admitting: Emergency Medicine

## 2023-11-15 DIAGNOSIS — N1831 Chronic kidney disease, stage 3a: Secondary | ICD-10-CM | POA: Diagnosis present

## 2023-11-15 DIAGNOSIS — I679 Cerebrovascular disease, unspecified: Secondary | ICD-10-CM | POA: Diagnosis present

## 2023-11-15 DIAGNOSIS — K5900 Constipation, unspecified: Secondary | ICD-10-CM | POA: Insufficient documentation

## 2023-11-15 DIAGNOSIS — E1122 Type 2 diabetes mellitus with diabetic chronic kidney disease: Secondary | ICD-10-CM | POA: Diagnosis present

## 2023-11-15 DIAGNOSIS — Z821 Family history of blindness and visual loss: Secondary | ICD-10-CM

## 2023-11-15 DIAGNOSIS — Z87891 Personal history of nicotine dependence: Secondary | ICD-10-CM

## 2023-11-15 DIAGNOSIS — E66811 Obesity, class 1: Secondary | ICD-10-CM | POA: Diagnosis present

## 2023-11-15 DIAGNOSIS — Z833 Family history of diabetes mellitus: Secondary | ICD-10-CM

## 2023-11-15 DIAGNOSIS — Z1152 Encounter for screening for COVID-19: Secondary | ICD-10-CM

## 2023-11-15 DIAGNOSIS — N179 Acute kidney failure, unspecified: Secondary | ICD-10-CM | POA: Diagnosis not present

## 2023-11-15 DIAGNOSIS — Z5321 Procedure and treatment not carried out due to patient leaving prior to being seen by health care provider: Secondary | ICD-10-CM | POA: Insufficient documentation

## 2023-11-15 DIAGNOSIS — Z6832 Body mass index (BMI) 32.0-32.9, adult: Secondary | ICD-10-CM

## 2023-11-15 DIAGNOSIS — Z8673 Personal history of transient ischemic attack (TIA), and cerebral infarction without residual deficits: Secondary | ICD-10-CM

## 2023-11-15 DIAGNOSIS — K8020 Calculus of gallbladder without cholecystitis without obstruction: Secondary | ICD-10-CM | POA: Diagnosis present

## 2023-11-15 DIAGNOSIS — Z555 Less than a high school diploma: Secondary | ICD-10-CM

## 2023-11-15 DIAGNOSIS — Z5986 Financial insecurity: Secondary | ICD-10-CM

## 2023-11-15 DIAGNOSIS — E870 Hyperosmolality and hypernatremia: Secondary | ICD-10-CM | POA: Diagnosis not present

## 2023-11-15 DIAGNOSIS — Z811 Family history of alcohol abuse and dependence: Secondary | ICD-10-CM

## 2023-11-15 DIAGNOSIS — Z91041 Radiographic dye allergy status: Secondary | ICD-10-CM

## 2023-11-15 DIAGNOSIS — K219 Gastro-esophageal reflux disease without esophagitis: Secondary | ICD-10-CM | POA: Diagnosis present

## 2023-11-15 DIAGNOSIS — Z8261 Family history of arthritis: Secondary | ICD-10-CM

## 2023-11-15 DIAGNOSIS — Z794 Long term (current) use of insulin: Secondary | ICD-10-CM

## 2023-11-15 DIAGNOSIS — E11319 Type 2 diabetes mellitus with unspecified diabetic retinopathy without macular edema: Secondary | ICD-10-CM | POA: Diagnosis present

## 2023-11-15 DIAGNOSIS — Z7951 Long term (current) use of inhaled steroids: Secondary | ICD-10-CM

## 2023-11-15 DIAGNOSIS — I5022 Chronic systolic (congestive) heart failure: Secondary | ICD-10-CM | POA: Diagnosis present

## 2023-11-15 DIAGNOSIS — Z79899 Other long term (current) drug therapy: Secondary | ICD-10-CM

## 2023-11-15 DIAGNOSIS — M797 Fibromyalgia: Secondary | ICD-10-CM | POA: Diagnosis present

## 2023-11-15 DIAGNOSIS — R112 Nausea with vomiting, unspecified: Secondary | ICD-10-CM | POA: Insufficient documentation

## 2023-11-15 DIAGNOSIS — E669 Obesity, unspecified: Secondary | ICD-10-CM | POA: Diagnosis present

## 2023-11-15 DIAGNOSIS — E861 Hypovolemia: Secondary | ICD-10-CM | POA: Diagnosis not present

## 2023-11-15 DIAGNOSIS — J4540 Moderate persistent asthma, uncomplicated: Secondary | ICD-10-CM | POA: Diagnosis present

## 2023-11-15 DIAGNOSIS — Z888 Allergy status to other drugs, medicaments and biological substances status: Secondary | ICD-10-CM

## 2023-11-15 DIAGNOSIS — Z818 Family history of other mental and behavioral disorders: Secondary | ICD-10-CM

## 2023-11-15 DIAGNOSIS — I130 Hypertensive heart and chronic kidney disease with heart failure and stage 1 through stage 4 chronic kidney disease, or unspecified chronic kidney disease: Secondary | ICD-10-CM | POA: Diagnosis present

## 2023-11-15 DIAGNOSIS — M1990 Unspecified osteoarthritis, unspecified site: Secondary | ICD-10-CM | POA: Diagnosis present

## 2023-11-15 DIAGNOSIS — Z7982 Long term (current) use of aspirin: Secondary | ICD-10-CM

## 2023-11-15 DIAGNOSIS — D631 Anemia in chronic kidney disease: Secondary | ICD-10-CM | POA: Diagnosis present

## 2023-11-15 DIAGNOSIS — E1165 Type 2 diabetes mellitus with hyperglycemia: Secondary | ICD-10-CM | POA: Diagnosis present

## 2023-11-15 DIAGNOSIS — I10 Essential (primary) hypertension: Secondary | ICD-10-CM | POA: Diagnosis present

## 2023-11-15 DIAGNOSIS — E119 Type 2 diabetes mellitus without complications: Secondary | ICD-10-CM

## 2023-11-15 DIAGNOSIS — E872 Acidosis, unspecified: Secondary | ICD-10-CM | POA: Diagnosis not present

## 2023-11-15 DIAGNOSIS — R7401 Elevation of levels of liver transaminase levels: Secondary | ICD-10-CM | POA: Diagnosis present

## 2023-11-15 DIAGNOSIS — Z809 Family history of malignant neoplasm, unspecified: Secondary | ICD-10-CM

## 2023-11-15 DIAGNOSIS — M889 Osteitis deformans of unspecified bone: Secondary | ICD-10-CM | POA: Diagnosis present

## 2023-11-15 DIAGNOSIS — Z885 Allergy status to narcotic agent status: Secondary | ICD-10-CM

## 2023-11-15 HISTORY — DX: Essential (primary) hypertension: I10

## 2023-11-15 LAB — COMPREHENSIVE METABOLIC PANEL
ALT: 76 U/L — ABNORMAL HIGH (ref 0–44)
AST: 89 U/L — ABNORMAL HIGH (ref 15–41)
Albumin: 2.4 g/dL — ABNORMAL LOW (ref 3.5–5.0)
Alkaline Phosphatase: 35 U/L — ABNORMAL LOW (ref 38–126)
Anion gap: 12 (ref 5–15)
BUN: 37 mg/dL — ABNORMAL HIGH (ref 6–20)
CO2: 21 mmol/L — ABNORMAL LOW (ref 22–32)
Calcium: 8.4 mg/dL — ABNORMAL LOW (ref 8.9–10.3)
Chloride: 107 mmol/L (ref 98–111)
Creatinine, Ser: 2.48 mg/dL — ABNORMAL HIGH (ref 0.44–1.00)
GFR, Estimated: 22 mL/min — ABNORMAL LOW (ref >60)
Glucose, Bld: 175 mg/dL — ABNORMAL HIGH (ref 70–99)
Potassium: 4.6 mmol/L (ref 3.5–5.1)
Sodium: 140 mmol/L (ref 135–145)
Total Bilirubin: 0.9 mg/dL (ref 0.0–1.2)
Total Protein: 6.7 g/dL (ref 6.5–8.1)

## 2023-11-15 LAB — CBC WITH DIFFERENTIAL/PLATELET
Abs Immature Granulocytes: 0.06 10*3/uL (ref 0.00–0.07)
Basophils Absolute: 0.1 10*3/uL (ref 0.0–0.1)
Basophils Relative: 1 %
Eosinophils Absolute: 0.1 10*3/uL (ref 0.0–0.5)
Eosinophils Relative: 1 %
HCT: 40.3 % (ref 36.0–46.0)
Hemoglobin: 12.5 g/dL (ref 12.0–15.0)
Immature Granulocytes: 1 %
Lymphocytes Relative: 10 %
Lymphs Abs: 1 10*3/uL (ref 0.7–4.0)
MCH: 28.3 pg (ref 26.0–34.0)
MCHC: 31 g/dL (ref 30.0–36.0)
MCV: 91.4 fL (ref 80.0–100.0)
Monocytes Absolute: 0.7 10*3/uL (ref 0.1–1.0)
Monocytes Relative: 8 %
Neutro Abs: 7.7 10*3/uL (ref 1.7–7.7)
Neutrophils Relative %: 79 %
Platelets: 300 10*3/uL (ref 150–400)
RBC: 4.41 MIL/uL (ref 3.87–5.11)
RDW: 12.4 % (ref 11.5–15.5)
WBC: 9.6 10*3/uL (ref 4.0–10.5)
nRBC: 0 % (ref 0.0–0.2)

## 2023-11-15 LAB — RESP PANEL BY RT-PCR (RSV, FLU A&B, COVID)  RVPGX2
Influenza A by PCR: NEGATIVE
Influenza B by PCR: NEGATIVE
Resp Syncytial Virus by PCR: NEGATIVE
SARS Coronavirus 2 by RT PCR: NEGATIVE

## 2023-11-15 MED ORDER — METOPROLOL TARTRATE 25 MG PO TABS
25.0000 mg | ORAL_TABLET | Freq: Once | ORAL | Status: DC
  Filled 2023-11-15: qty 1

## 2023-11-15 MED ORDER — LACTATED RINGERS IV BOLUS
1000.0000 mL | Freq: Once | INTRAVENOUS | Status: AC
  Administered 2023-11-15: 1000 mL via INTRAVENOUS

## 2023-11-15 MED ORDER — ONDANSETRON HCL 4 MG/2ML IJ SOLN
4.0000 mg | Freq: Once | INTRAMUSCULAR | Status: AC
  Administered 2023-11-15: 4 mg via INTRAVENOUS
  Filled 2023-11-15: qty 2

## 2023-11-15 MED ORDER — LOSARTAN POTASSIUM 25 MG PO TABS
25.0000 mg | ORAL_TABLET | Freq: Once | ORAL | Status: DC
  Filled 2023-11-15: qty 1

## 2023-11-15 MED ORDER — ONDANSETRON 4 MG PO TBDP
4.0000 mg | ORAL_TABLET | Freq: Once | ORAL | Status: AC
  Administered 2023-11-15: 4 mg via ORAL
  Filled 2023-11-15: qty 1

## 2023-11-15 NOTE — ED Triage Notes (Signed)
 Pt in with abdominal pain, constipation and emesis x 4 days. Pt states she took some mineral oil and had some stool, but still feels constipated. Denies any blood in vomit, has been unable to keep down food x 3 days

## 2023-11-15 NOTE — ED Provider Triage Note (Signed)
 Emergency Medicine Provider Triage Evaluation Note  Cassie Mata , a 58 y.o. female  was evaluated in triage.  Pt complains of nausea and vomiting.  Patient reports nausea and vomiting for the past week without abdominal pain or fevers.  Endorses constipation as well.  Review of Systems  Positive: Nausea, vomiting Negative: Abdominal pain, fever  Physical Exam  BP (!) 183/115 (BP Location: Right Arm)   Pulse 94   Temp 98.3 F (36.8 C) (Oral)   Resp 18   LMP 09/06/2015 (Exact Date)   SpO2 100%  Gen:   Awake, no distress   Resp:  Normal effort  MSK:   Moves extremities without difficulty  Other:    Medical Decision Making  Medically screening exam initiated at 6:55 PM.  Appropriate orders placed.  Cassie Mata was informed that the remainder of the evaluation will be completed by another provider, this initial triage assessment does not replace that evaluation, and the importance of remaining in the ED until their evaluation is complete.   Maxwell Marion, PA-C 11/15/23 1858

## 2023-11-15 NOTE — ED Provider Notes (Signed)
 [place] EMERGENCY DEPARTMENT AT [facility] Provider Note   CSN: [id number] Arrival date & time: 11/15/23  2122     History  Chief Complaint  Patient presents with   Abdominal Pain    Cassie Mata is a 58 y.o. female.  She has PMH significant for uncontrolled type 2 diabetes, hypertension, GERD, CVA, CHF, Paget's bone disease and asthma.  Presents to ER today for evaluation of nausea vomiting abdominal pain x 4 days. She states she has not been to keep anything down, denies diarrhea.  States she has not been to have a bowel movement, had a small bowel movement after enema.  She went to the emergency room at [facility] earlier today and had labs and x-ray but never was called back to room so decided to come to the ED here.  She still feeling nauseated despite being given a nausea pill at the of the emergency room.  Denies chest pain or shortness of breath, denies cough or fever, no hematemesis or hematochezia or melena.  Denies prior symptoms like this.  She has not been to keep any of her medications down.   Abdominal Pain      Home Medications Prior to Admission medications   Medication Sig Start Date End Date Taking? Authorizing Provider  AgaMatrix Ultra-Thin Lancets MISC Check sugars twice daily before meals 03/24/21   Raspet, Erin K, PA-C  albuterol (VENTOLIN HFA) 108 (90 Base) MCG/ACT inhaler Inhale 1-2 puffs into the lungs every 6 (six) hours as needed for wheezing. 04/24/22   Verlee Monte, MD  aspirin 81 MG tablet Take 81 mg by mouth daily.     [provider]  atorvastatin (LIPITOR) 80 MG tablet Take 80 mg by mouth daily. 11/08/20   [provider]  azelastine (ASTELIN) 0.1 % nasal spray Place 2 sprays into both nostrils 2 (two) times daily as needed for rhinitis. Use in each nostril as directed 04/24/22   Verlee Monte, MD  Blood Glucose Monitoring Suppl [facility] PRESTO) w/Device KIT Check blood sugars twice daily before meals 10/20/17    Julieanne Manson, MD  cholecalciferol (VITAMIN D) 1000 UNITS tablet Take 2,000 Units by mouth at bedtime.     [provider]  cyclobenzaprine (FLEXERIL) 10 MG tablet Take 1 tablet (10 mg total) by mouth 2 (two) times daily as needed for muscle spasms. 10/31/17   Sharlene Dory, DO  diphenhydrAMINE (BENADRYL) 25 mg capsule Take 25 mg by mouth every 6 (six) hours as needed for allergies.    [provider]  fluticasone (FLONASE) 50 MCG/ACT nasal spray Place 2 sprays into both nostrils daily. 04/24/22   Verlee Monte, MD  fluticasone-salmeterol (ADVAIR) 250-50 MCG/ACT AEPB Inhale 1 puff into the lungs in the morning and at bedtime. 04/24/22   Verlee Monte, MD  furosemide (LASIX) 40 MG tablet Take 1 tablet (40 mg total) by mouth daily for 5 days. 07/30/20 06/04/21  Dahlia Byes A, FNP  glucose blood (AGAMATRIX PRESTO TEST) test strip Check sugars twice daily before meals 03/24/21   Rehman, Areeg N, DO  HYDROcodone-acetaminophen (NORCO/VICODIN) 5-325 MG tablet Take 1 tablet by mouth every 12 (twelve) hours as needed for moderate pain. 05/14/21   Valeria Batman, MD  ibuprofen (ADVIL) 200 MG tablet Take 200 mg by mouth daily as needed for headache or mild pain.    [provider]  insulin aspart (FIASP) 100 UNIT/ML FlexTouch Pen Inject 10 Units into the skin with breakfast,  with lunch, and with evening meal. Patient not taking: Reported on 06/04/2021 04/12/21   Inez Catalina, MD  Insulin Syringes, Disposable, U-100 0.3 ML MISC 1 Syringe by Does not apply route as needed. Patient not taking: Reported on 06/04/2021 03/24/21   Inez Catalina, MD  isosorbide mononitrate (IMDUR) 30 MG 24 hr tablet Take 30 mg by mouth daily. Patient not taking: Reported on 06/04/2021 11/08/20   [provider]  lidocaine (LIDODERM) 5 % Place 1 patch onto the skin daily. Remove & Discard patch within 12 hours or as directed by MD Patient not taking: Reported on 06/04/2021 03/24/21    Rehman, Areeg N, DO  loratadine (CLARITIN) 10 MG tablet Take 1 tablet (10 mg total) by mouth daily. 04/24/22   Verlee Monte, MD  losartan (COZAAR) 25 MG tablet TAKE 2 TABLETS BY MOUTH EVERY DAY 12/31/21   Marolyn Haller, MD  metoprolol tartrate (LOPRESSOR) 25 MG tablet TAKE 1/2 (ONE-HALF) TABLET BY MOUTH TWICE DAILY 04/22/21   Rehman, Areeg N, DO  Multiple Vitamin (MULTIVITAMIN) tablet Take 1 tablet by mouth daily.    [provider]  nitroGLYCERIN (NITROSTAT) 0.4 MG SL tablet Place 1 tablet (0.4 mg total) under the tongue every 5 (five) minutes as needed for chest pain. 03/24/21 06/24/21  Rehman, Areeg N, DO  pantoprazole (PROTONIX) 40 MG tablet Take 1 tablet (40 mg total) by mouth daily. Reported on 09/13/2015 Patient not taking: Reported on 06/04/2021 07/19/18   Wallis Bamberg, PA-C  TECHLITE PEN NEEDLES 32G X 4 MM MISC USE TO ADMINISTER INSULIN ONCE DAILY 08/27/21   Doran Stabler, DO  TRESIBA FLEXTOUCH 100 UNIT/ML FlexTouch Pen INJECT TWELVE UNITS UNDER THE SKIN DAILY 12/10/21   Marolyn Haller, MD      Allergies    Eszopiclone, Gabapentin, Ivp dye [iodinated contrast media], Other, Prednisone, and Ultracet [tramadol-acetaminophen]    Review of Systems   Review of Systems  Gastrointestinal:  Positive for abdominal pain.    Physical Exam Updated Vital Signs BP (!) 181/112 (BP Location: Left Arm)   Pulse 94   Temp 98.6 F (37 C) (Oral)   Resp 18   LMP 09/06/2015 (Exact Date)   SpO2 98%  Physical Exam Vitals and nursing note reviewed.  Constitutional:      General: She is not in acute distress.    Appearance: She is well-developed.  HENT:     Head: Normocephalic and atraumatic.  Eyes:     Conjunctiva/sclera: Conjunctivae normal.     Pupils: Pupils are equal, round, and reactive to light.  Cardiovascular:     Rate and Rhythm: Normal rate and regular rhythm.     Heart sounds: No murmur heard. Pulmonary:     Effort: Pulmonary effort is normal. No respiratory distress.      Breath sounds: Normal breath sounds.  Abdominal:     Palpations: Abdomen is soft.     Tenderness: There is generalized abdominal tenderness. There is no guarding or rebound.  Musculoskeletal:        General: No swelling.     Cervical back: Neck supple.  Skin:    General: Skin is warm and dry.     Capillary Refill: Capillary refill takes less than 2 seconds.  Neurological:     General: No focal deficit present.     Mental Status: She is alert and oriented to person, place, and time.  Psychiatric:        Mood and Affect: Mood normal.     ED  Results / Procedures / Treatments   Labs (all labs ordered are listed, but only abnormal results are displayed) Labs Reviewed  CBC WITH DIFFERENTIAL/PLATELET  COMPREHENSIVE METABOLIC PANEL  MAGNESIUM  MAGNESIUM    EKG None  Radiology CT ABDOMEN PELVIS WO CONTRAST Result Date: 11/15/2023 CLINICAL DATA:  Acute nonlocalized abdominal pain EXAM: CT ABDOMEN AND PELVIS WITHOUT CONTRAST TECHNIQUE: Multidetector CT imaging of the abdomen and pelvis was performed following the standard protocol without IV contrast. RADIATION DOSE REDUCTION: This exam was performed according to the departmental dose-optimization program which includes automated exposure control, adjustment of the mA and/or kV according to patient size and/or use of iterative reconstruction technique. COMPARISON:  03/22/2021 FINDINGS: Lower chest: Small bilateral pleural effusions, right greater than left. Hypoattenuation of the cardiac blood pool is in keeping with at least mild anemia. Small hernia. No acute abnormality. Hepatobiliary: Cholelithiasis without superimposed pericholecystic inflammatory change. Liver unremarkable on this noncontrast examination. No intra or extrahepatic biliary ductal dilation. Pancreas: Unremarkable. No pancreatic ductal dilatation or surrounding inflammatory changes. Spleen: Unremarkable Adrenals/Urinary Tract: Adrenal glands are unremarkable. Kidneys are  normal, without renal calculi, focal lesion, or hydronephrosis. Bladder is unremarkable. Stomach/Bowel: Moderate pancolonic diverticulosis. Stomach, small bowel, and large bowel are otherwise unremarkable. Appendix normal. No evidence of obstruction or focal inflammation. No free intraperitoneal gas or fluid. Vascular/Lymphatic: Aortic atherosclerosis. No enlarged abdominal or pelvic lymph nodes. Reproductive: Uterus and bilateral adnexa are unremarkable. Other: Diffuse subcutaneous body wall edema is present in keeping with anasarca Musculoskeletal: No acute or significant osseous findings. IMPRESSION: 1. No acute intra-abdominal pathology identified. No definite radiographic explanation for the patient's reported symptoms. 2. Small bilateral pleural effusions, right greater than left. Diffuse subcutaneous body wall edema in keeping with anasarca. 3. Cholelithiasis. 4. Moderate pancolonic diverticulosis without superimposed acute inflammatory change. Aortic Atherosclerosis (ICD10-I70.0). Electronically Signed   By: Helyn Numbers M.D.   On: 11/15/2023 23:32   DG Abdomen 1 View Result Date: 11/15/2023 CLINICAL DATA:  Abdominal pain and vomiting. EXAM: ABDOMEN - 1 VIEW COMPARISON:  CT 03/22/2021 FINDINGS: No bowel dilatation to suggest obstruction. Moderate stool in the colon. No abnormal rectal distention. No evidence of free air. Gallstones on prior CT are not seen by radiograph. There are multiple pelvic phleboliths. Vascular calcifications in the pelvis. Included lower lung bases are clear. No acute osseous findings. IMPRESSION: Normal bowel gas pattern. Moderate stool in the colon. Electronically Signed   By: Narda Rutherford M.D.   On: 11/15/2023 19:55    Procedures Procedures    Medications Ordered in ED Medications  metoprolol tartrate (LOPRESSOR) tablet 25 mg (has no administration in time range)  acetaminophen (TYLENOL) tablet 650 mg (has no administration in time range)    Or  acetaminophen  (TYLENOL) suppository 650 mg (has no administration in time range)  hydrALAZINE (APRESOLINE) injection 10 mg (has no administration in time range)  melatonin tablet 3 mg (has no administration in time range)  ondansetron (ZOFRAN) injection 4 mg (has no administration in time range)  lactated ringers infusion (has no administration in time range)  LORazepam (ATIVAN) injection 0.5 mg (has no administration in time range)  lactated ringers bolus 1,000 mL (0 mLs Intravenous Stopped 11/16/23 0000)  ondansetron (ZOFRAN) injection 4 mg (4 mg Intravenous Given 11/15/23 2232)    ED Course/ Medical Decision Making/ A&P  Medical Decision Making This patient presents to the ED for concern of nausea and vomiting, this involves an extensive number of treatment options, and is a complaint that carries with it a high risk of complications and morbidity.  The differential diagnosis includes stress, gastroenteritis, bowel obstruction, volvulus, diverticulitis, gastroparesis, AKI, electrolyte derangement, other   Co morbidities that complicate the patient evaluation :   Diabetes, CVA, CHF   Additional history obtained:  Additional history obtained from EMR External records from outside source obtained and reviewed including prior notes, prior labs, x-ray from another ED visit earlier today   Lab Tests: I used the labs from [facility] several hours ago, patient creatinine today 2.48 with BUN 37, baseline creatinine is around 1.2 per Care Everywhere notes in 2023, patient also had mild elevation of ALT and AST today.  Urinalysis showed proteinuria but no infection.  CBC was normal  Imaging Studies ordered:  I ordered imaging studies including CT abdomen pelvis which shows Coley lithiasis without cholecystitis, anasarca, diverticulosis without diverticulitis I independently visualized and interpreted imaging within scope of identifying emergent findings  I agree with the  radiologist interpretation   Consultations Obtained:  I requested consultation with the hospitalist Dr. Arlean Hopping,  and discussed lab and imaging findings as well as pertinent plan - they recommend: admission   Problem List / ED Course / Critical interventions / Medication management  Nausea vomiting with AKI-patient has had several days of nausea and vomiting.  Seen at [facility] earlier but did not stay for full evaluation due to wait time, she did have labs showing AKI.  She had abdominal x-ray that showed normal bowel gas pattern, given persistent nausea and vomiting ordered a CT abdomen pelvis here which showed no acute findings to explain her vomiting.  She is given IV fluids here, given her p.o. blood pressure meds as she has not been taking them home and is hypertensive.  Of note she is not having any chest pain, shortness of breath, severe headache, numbness tingling or weakness.  She states blood pressure often runs high especially when she is in the hospital. And her AKI and persistent nausea and vomiting will admit to the hospital for fluids and further evaluation.  Discussed with hospitalist as above. I ordered medication including Zofran for nausea Reevaluation of the patient after these medicines showed that the patient improved I have reviewed the patients home medicines and have made adjustments as needed   Social Determinants of Health:  Patient lives at home       Amount and/or Complexity of Data Reviewed Radiology: ordered.  Risk Prescription drug management. Decision regarding hospitalization.           Final Clinical Impression(s) / ED Diagnoses Final diagnoses:  AKI (acute kidney injury) (HCC)  Nausea and vomiting, unspecified vomiting type    Rx / DC Orders ED Discharge Orders     None         Ma Rings, PA-C 11/16/23 0045    Melene Plan, DO 11/16/23 0710

## 2023-11-15 NOTE — ED Notes (Signed)
 Pt leaving AMA.

## 2023-11-15 NOTE — ED Triage Notes (Signed)
 Pt reports at [facility] and states everything is the same and that she is here because they took to long to call her back for a room , please see previous triage I have attached.   "   Pt in with abdominal pain, constipation and emesis x 4 days. Pt states she took some mineral oil and had some stool, but still feels constipated. Denies any blood in vomit, has been unable to keep down food x 3 days

## 2023-11-15 NOTE — ED Provider Notes (Incomplete)
 [place] EMERGENCY DEPARTMENT AT [facility] Provider Note   CSN: [id number] Arrival date & time: 11/15/23  2122     History {Add pertinent medical, surgical, social history, OB history to HPI:1} Chief Complaint  Patient presents with  . Abdominal Pain    Cassie Mata is a 58 y.o. female.  She has PMH significant for uncontrolled type 2 diabetes, hypertension, GERD, CVA, CHF, Paget's bone disease and asthma.  Presents to ER today for evaluation of nausea vomiting abdominal pain x 4 days. She states she has not been to keep anything down, denies diarrhea.  States she has not been to have a bowel movement, had a small bowel movement after enema.  She went to the emergency room at [facility] earlier today and had labs and x-ray but never was called back to room so decided to come to the ED here.  She still feeling nauseated despite being given a nausea pill at the of the emergency room.  Denies chest pain or shortness of breath, denies cough or fever, no hematemesis or hematochezia or melena.  Denies prior symptoms like this.  She has not been to keep any of her medications down.   Abdominal Pain      Home Medications Prior to Admission medications   Medication Sig Start Date End Date Taking? Authorizing Provider  AgaMatrix Ultra-Thin Lancets MISC Check sugars twice daily before meals 03/24/21   Raspet, Erin K, PA-C  albuterol (VENTOLIN HFA) 108 (90 Base) MCG/ACT inhaler Inhale 1-2 puffs into the lungs every 6 (six) hours as needed for wheezing. 04/24/22   Verlee Monte, MD  aspirin 81 MG tablet Take 81 mg by mouth daily.     [provider]  atorvastatin (LIPITOR) 80 MG tablet Take 80 mg by mouth daily. 11/08/20   [provider]  azelastine (ASTELIN) 0.1 % nasal spray Place 2 sprays into both nostrils 2 (two) times daily as needed for rhinitis. Use in each nostril as directed 04/24/22   Verlee Monte, MD  Blood Glucose Monitoring Suppl [facility] PRESTO)  w/Device KIT Check blood sugars twice daily before meals 10/20/17   Julieanne Manson, MD  cholecalciferol (VITAMIN D) 1000 UNITS tablet Take 2,000 Units by mouth at bedtime.     [provider]  cyclobenzaprine (FLEXERIL) 10 MG tablet Take 1 tablet (10 mg total) by mouth 2 (two) times daily as needed for muscle spasms. 10/31/17   Sharlene Dory, DO  diphenhydrAMINE (BENADRYL) 25 mg capsule Take 25 mg by mouth every 6 (six) hours as needed for allergies.    [provider]  fluticasone (FLONASE) 50 MCG/ACT nasal spray Place 2 sprays into both nostrils daily. 04/24/22   Verlee Monte, MD  fluticasone-salmeterol (ADVAIR) 250-50 MCG/ACT AEPB Inhale 1 puff into the lungs in the morning and at bedtime. 04/24/22   Verlee Monte, MD  furosemide (LASIX) 40 MG tablet Take 1 tablet (40 mg total) by mouth daily for 5 days. 07/30/20 06/04/21  Dahlia Byes A, FNP  glucose blood (AGAMATRIX PRESTO TEST) test strip Check sugars twice daily before meals 03/24/21   Rehman, Areeg N, DO  HYDROcodone-acetaminophen (NORCO/VICODIN) 5-325 MG tablet Take 1 tablet by mouth every 12 (twelve) hours as needed for moderate pain. 05/14/21   Valeria Batman, MD  ibuprofen (ADVIL) 200 MG tablet Take 200 mg by mouth daily as needed for headache or mild pain.    [provider]  insulin aspart (FIASP) 100 UNIT/ML FlexTouch  Pen Inject 10 Units into the skin with breakfast, with lunch, and with evening meal. Patient not taking: Reported on 06/04/2021 04/12/21   Inez Catalina, MD  Insulin Syringes, Disposable, U-100 0.3 ML MISC 1 Syringe by Does not apply route as needed. Patient not taking: Reported on 06/04/2021 03/24/21   Inez Catalina, MD  isosorbide mononitrate (IMDUR) 30 MG 24 hr tablet Take 30 mg by mouth daily. Patient not taking: Reported on 06/04/2021 11/08/20   [provider]  lidocaine (LIDODERM) 5 % Place 1 patch onto the skin daily. Remove & Discard patch within 12 hours or as  directed by MD Patient not taking: Reported on 06/04/2021 03/24/21   Rehman, Areeg N, DO  loratadine (CLARITIN) 10 MG tablet Take 1 tablet (10 mg total) by mouth daily. 04/24/22   Verlee Monte, MD  losartan (COZAAR) 25 MG tablet TAKE 2 TABLETS BY MOUTH EVERY DAY 12/31/21   Marolyn Haller, MD  metoprolol tartrate (LOPRESSOR) 25 MG tablet TAKE 1/2 (ONE-HALF) TABLET BY MOUTH TWICE DAILY 04/22/21   Rehman, Areeg N, DO  Multiple Vitamin (MULTIVITAMIN) tablet Take 1 tablet by mouth daily.    [provider]  nitroGLYCERIN (NITROSTAT) 0.4 MG SL tablet Place 1 tablet (0.4 mg total) under the tongue every 5 (five) minutes as needed for chest pain. 03/24/21 06/24/21  Rehman, Areeg N, DO  pantoprazole (PROTONIX) 40 MG tablet Take 1 tablet (40 mg total) by mouth daily. Reported on 09/13/2015 Patient not taking: Reported on 06/04/2021 07/19/18   Wallis Bamberg, PA-C  TECHLITE PEN NEEDLES 32G X 4 MM MISC USE TO ADMINISTER INSULIN ONCE DAILY 08/27/21   Doran Stabler, DO  TRESIBA FLEXTOUCH 100 UNIT/ML FlexTouch Pen INJECT TWELVE UNITS UNDER THE SKIN DAILY 12/10/21   Marolyn Haller, MD      Allergies    Eszopiclone, Gabapentin, Ivp dye [iodinated contrast media], Other, Prednisone, and Ultracet [tramadol-acetaminophen]    Review of Systems   Review of Systems  Gastrointestinal:  Positive for abdominal pain.    Physical Exam Updated Vital Signs LMP 09/06/2015 (Exact Date)  Physical Exam Vitals and nursing note reviewed.  Constitutional:      General: She is not in acute distress.    Appearance: She is well-developed.  HENT:     Head: Normocephalic and atraumatic.  Eyes:     Conjunctiva/sclera: Conjunctivae normal.     Pupils: Pupils are equal, round, and reactive to light.  Cardiovascular:     Rate and Rhythm: Normal rate and regular rhythm.     Heart sounds: No murmur heard. Pulmonary:     Effort: Pulmonary effort is normal. No respiratory distress.     Breath sounds: Normal breath sounds.   Abdominal:     Palpations: Abdomen is soft.     Tenderness: There is generalized abdominal tenderness. There is no guarding or rebound.  Musculoskeletal:        General: No swelling.     Cervical back: Neck supple.  Skin:    General: Skin is warm and dry.     Capillary Refill: Capillary refill takes less than 2 seconds.  Neurological:     General: No focal deficit present.     Mental Status: She is alert and oriented to person, place, and time.  Psychiatric:        Mood and Affect: Mood normal.     ED Results / Procedures / Treatments   Labs (all labs ordered are listed, but only abnormal results are displayed) Labs  Reviewed - No data to display  EKG None  Radiology DG Abdomen 1 View Result Date: 11/15/2023 CLINICAL DATA:  Abdominal pain and vomiting. EXAM: ABDOMEN - 1 VIEW COMPARISON:  CT 03/22/2021 FINDINGS: No bowel dilatation to suggest obstruction. Moderate stool in the colon. No abnormal rectal distention. No evidence of free air. Gallstones on prior CT are not seen by radiograph. There are multiple pelvic phleboliths. Vascular calcifications in the pelvis. Included lower lung bases are clear. No acute osseous findings. IMPRESSION: Normal bowel gas pattern. Moderate stool in the colon. Electronically Signed   By: Narda Rutherford M.D.   On: 11/15/2023 19:55    Procedures Procedures  {Document cardiac monitor, telemetry assessment procedure when appropriate:1}  Medications Ordered in ED Medications - No data to display  ED Course/ Medical Decision Making/ A&P   {   Click here for ABCD2, HEART and other calculatorsREFRESH Note before signing :1}                              Medical Decision Making This patient presents to the ED for concern of nausea and vomiting, this involves an extensive number of treatment options, and is a complaint that carries with it a high risk of complications and morbidity.  The differential diagnosis includes ***   Co morbidities that  complicate the patient evaluation :   Diabetes, CVA, CHF   Additional history obtained:  Additional history obtained from EMR External records from outside source obtained and reviewed including prior notes, prior labs, x-ray from another ED visit earlier today   Lab Tests: I used the labs from [facility] several hours ago, patient creatinine today 2.48 with BUN 37, baseline creatinine is around 1.2 per Care Everywhere notes in 2023, patient also had mild elevation of ALT and AST today.  Urinalysis showed proteinuria but no infection.  CBC was normal  Imaging Studies ordered:  I ordered imaging studies including CT abdomen pelvis which shows *** I independently visualized and interpreted imaging within scope of identifying emergent findings  I agree with the radiologist interpretation   Consultations Obtained:  I requested consultation with the ***,  and discussed lab and imaging findings as well as pertinent plan - they recommend: ***   Problem List / ED Course / Critical interventions / Medication management  *** I ordered medication including ***  for ***  Reevaluation of the patient after these medicines showed that the patient improved I have reviewed the patients home medicines and have made adjustments as needed   Social Determinants of Health:  ***   Test / Admission - Considered:  ***    Amount and/or Complexity of Data Reviewed Radiology: ordered.  Risk Prescription drug management.   ***  {Document critical care time when appropriate:1} {Document review of labs and clinical decision tools ie heart score, Chads2Vasc2 etc:1}  {Document your independent review of radiology images, and any outside records:1} {Document your discussion with family members, caretakers, and with consultants:1} {Document social determinants of health affecting pt's care:1} {Document your decision making why or why not admission, treatments were needed:1} Final Clinical  Impression(s) / ED Diagnoses Final diagnoses:  None    Rx / DC Orders ED Discharge Orders     None

## 2023-11-16 ENCOUNTER — Inpatient Hospital Stay (HOSPITAL_COMMUNITY)

## 2023-11-16 ENCOUNTER — Encounter (HOSPITAL_COMMUNITY): Payer: Self-pay | Admitting: Internal Medicine

## 2023-11-16 DIAGNOSIS — E1122 Type 2 diabetes mellitus with diabetic chronic kidney disease: Secondary | ICD-10-CM | POA: Diagnosis present

## 2023-11-16 DIAGNOSIS — M797 Fibromyalgia: Secondary | ICD-10-CM | POA: Diagnosis present

## 2023-11-16 DIAGNOSIS — Z1152 Encounter for screening for COVID-19: Secondary | ICD-10-CM | POA: Diagnosis not present

## 2023-11-16 DIAGNOSIS — R112 Nausea with vomiting, unspecified: Secondary | ICD-10-CM

## 2023-11-16 DIAGNOSIS — I5022 Chronic systolic (congestive) heart failure: Secondary | ICD-10-CM | POA: Diagnosis present

## 2023-11-16 DIAGNOSIS — E669 Obesity, unspecified: Secondary | ICD-10-CM | POA: Diagnosis present

## 2023-11-16 DIAGNOSIS — I5021 Acute systolic (congestive) heart failure: Secondary | ICD-10-CM

## 2023-11-16 DIAGNOSIS — I130 Hypertensive heart and chronic kidney disease with heart failure and stage 1 through stage 4 chronic kidney disease, or unspecified chronic kidney disease: Secondary | ICD-10-CM | POA: Diagnosis present

## 2023-11-16 DIAGNOSIS — E11319 Type 2 diabetes mellitus with unspecified diabetic retinopathy without macular edema: Secondary | ICD-10-CM | POA: Diagnosis present

## 2023-11-16 DIAGNOSIS — K219 Gastro-esophageal reflux disease without esophagitis: Secondary | ICD-10-CM | POA: Diagnosis present

## 2023-11-16 DIAGNOSIS — M889 Osteitis deformans of unspecified bone: Secondary | ICD-10-CM | POA: Diagnosis present

## 2023-11-16 DIAGNOSIS — E872 Acidosis, unspecified: Secondary | ICD-10-CM | POA: Diagnosis not present

## 2023-11-16 DIAGNOSIS — N1831 Chronic kidney disease, stage 3a: Secondary | ICD-10-CM | POA: Diagnosis present

## 2023-11-16 DIAGNOSIS — J301 Allergic rhinitis due to pollen: Secondary | ICD-10-CM | POA: Diagnosis not present

## 2023-11-16 DIAGNOSIS — Z794 Long term (current) use of insulin: Secondary | ICD-10-CM | POA: Diagnosis not present

## 2023-11-16 DIAGNOSIS — E1165 Type 2 diabetes mellitus with hyperglycemia: Secondary | ICD-10-CM | POA: Diagnosis present

## 2023-11-16 DIAGNOSIS — E870 Hyperosmolality and hypernatremia: Secondary | ICD-10-CM | POA: Diagnosis not present

## 2023-11-16 DIAGNOSIS — N179 Acute kidney failure, unspecified: Secondary | ICD-10-CM

## 2023-11-16 DIAGNOSIS — E861 Hypovolemia: Secondary | ICD-10-CM | POA: Diagnosis not present

## 2023-11-16 DIAGNOSIS — E119 Type 2 diabetes mellitus without complications: Secondary | ICD-10-CM

## 2023-11-16 DIAGNOSIS — E66811 Obesity, class 1: Secondary | ICD-10-CM | POA: Diagnosis present

## 2023-11-16 DIAGNOSIS — I10 Essential (primary) hypertension: Secondary | ICD-10-CM

## 2023-11-16 DIAGNOSIS — D631 Anemia in chronic kidney disease: Secondary | ICD-10-CM | POA: Diagnosis present

## 2023-11-16 DIAGNOSIS — J4540 Moderate persistent asthma, uncomplicated: Secondary | ICD-10-CM | POA: Diagnosis present

## 2023-11-16 DIAGNOSIS — J309 Allergic rhinitis, unspecified: Secondary | ICD-10-CM | POA: Insufficient documentation

## 2023-11-16 DIAGNOSIS — K5900 Constipation, unspecified: Secondary | ICD-10-CM | POA: Diagnosis present

## 2023-11-16 DIAGNOSIS — R7401 Elevation of levels of liver transaminase levels: Secondary | ICD-10-CM | POA: Diagnosis present

## 2023-11-16 DIAGNOSIS — K8020 Calculus of gallbladder without cholecystitis without obstruction: Secondary | ICD-10-CM | POA: Diagnosis present

## 2023-11-16 DIAGNOSIS — M1990 Unspecified osteoarthritis, unspecified site: Secondary | ICD-10-CM | POA: Diagnosis present

## 2023-11-16 DIAGNOSIS — Z6832 Body mass index (BMI) 32.0-32.9, adult: Secondary | ICD-10-CM | POA: Diagnosis not present

## 2023-11-16 LAB — COMPREHENSIVE METABOLIC PANEL
ALT: 70 U/L — ABNORMAL HIGH (ref 0–44)
AST: 73 U/L — ABNORMAL HIGH (ref 15–41)
Albumin: 2.6 g/dL — ABNORMAL LOW (ref 3.5–5.0)
Alkaline Phosphatase: 41 U/L (ref 38–126)
Anion gap: 10 (ref 5–15)
BUN: 45 mg/dL — ABNORMAL HIGH (ref 6–20)
CO2: 23 mmol/L (ref 22–32)
Calcium: 8.3 mg/dL — ABNORMAL LOW (ref 8.9–10.3)
Chloride: 109 mmol/L (ref 98–111)
Creatinine, Ser: 2.47 mg/dL — ABNORMAL HIGH (ref 0.44–1.00)
GFR, Estimated: 22 mL/min — ABNORMAL LOW (ref >60)
Glucose, Bld: 162 mg/dL — ABNORMAL HIGH (ref 70–99)
Potassium: 4.8 mmol/L (ref 3.5–5.1)
Sodium: 142 mmol/L (ref 135–145)
Total Bilirubin: 1.3 mg/dL — ABNORMAL HIGH (ref 0.0–1.2)
Total Protein: 7.2 g/dL (ref 6.5–8.1)

## 2023-11-16 LAB — CBC WITH DIFFERENTIAL/PLATELET
Abs Immature Granulocytes: 0.1 10*3/uL — ABNORMAL HIGH (ref 0.00–0.07)
Basophils Absolute: 0 10*3/uL (ref 0.0–0.1)
Basophils Relative: 0 %
Eosinophils Absolute: 0 10*3/uL (ref 0.0–0.5)
Eosinophils Relative: 0 %
HCT: 41.5 % (ref 36.0–46.0)
Hemoglobin: 12.9 g/dL (ref 12.0–15.0)
Immature Granulocytes: 1 %
Lymphocytes Relative: 8 %
Lymphs Abs: 0.9 10*3/uL (ref 0.7–4.0)
MCH: 28.3 pg (ref 26.0–34.0)
MCHC: 31.1 g/dL (ref 30.0–36.0)
MCV: 91 fL (ref 80.0–100.0)
Monocytes Absolute: 0.7 10*3/uL (ref 0.1–1.0)
Monocytes Relative: 6 %
Neutro Abs: 9.4 10*3/uL — ABNORMAL HIGH (ref 1.7–7.7)
Neutrophils Relative %: 85 %
Platelets: 312 10*3/uL (ref 150–400)
RBC: 4.56 MIL/uL (ref 3.87–5.11)
RDW: 12.7 % (ref 11.5–15.5)
WBC: 11.1 10*3/uL — ABNORMAL HIGH (ref 4.0–10.5)
nRBC: 0 % (ref 0.0–0.2)

## 2023-11-16 LAB — GLUCOSE, CAPILLARY
Glucose-Capillary: 158 mg/dL — ABNORMAL HIGH (ref 70–99)
Glucose-Capillary: 141 mg/dL — ABNORMAL HIGH (ref 70–99)
Glucose-Capillary: 152 mg/dL — ABNORMAL HIGH (ref 70–99)
Glucose-Capillary: 150 mg/dL — ABNORMAL HIGH (ref 70–99)

## 2023-11-16 LAB — MAGNESIUM
Magnesium: 2.4 mg/dL (ref 1.7–2.4)
Magnesium: 2.5 mg/dL — ABNORMAL HIGH (ref 1.7–2.4)

## 2023-11-16 LAB — ECHOCARDIOGRAM COMPLETE
Area-P 1/2: 5.42 cm2
Calc EF: 30.4 %
Height: 60 in
S' Lateral: 5.1 cm
Single Plane A2C EF: 23 %
Single Plane A4C EF: 36.5 %
Weight: 2624.36 [oz_av]

## 2023-11-16 LAB — APTT: aPTT: 27 s (ref 24–36)

## 2023-11-16 LAB — HEMOGLOBIN A1C
Hgb A1c MFr Bld: 6.5 % — ABNORMAL HIGH (ref 4.8–5.6)
Mean Plasma Glucose: 139.85 mg/dL

## 2023-11-16 LAB — HEPATITIS PANEL, ACUTE
HCV Ab: REACTIVE — AB
Hep A IgM: NONREACTIVE
Hep B C IgM: NONREACTIVE
Hepatitis B Surface Ag: NONREACTIVE

## 2023-11-16 LAB — LIPASE, BLOOD: Lipase: 28 U/L (ref 11–51)

## 2023-11-16 LAB — CK: Total CK: 296 U/L — ABNORMAL HIGH (ref 38–234)

## 2023-11-16 LAB — PROTIME-INR
INR: 1.1 (ref 0.8–1.2)
Prothrombin Time: 14.2 s (ref 11.4–15.2)

## 2023-11-16 LAB — TSH: TSH: 2.169 u[IU]/mL (ref 0.350–4.500)

## 2023-11-16 LAB — BRAIN NATRIURETIC PEPTIDE: B Natriuretic Peptide: >4500 pg/mL — ABNORMAL HIGH (ref 0.0–100.0)

## 2023-11-16 LAB — ETHANOL: Alcohol, Ethyl (B): <10 mg/dL (ref <10)

## 2023-11-16 MED ORDER — FENTANYL CITRATE PF 50 MCG/ML IJ SOSY: 25.0000 ug | PREFILLED_SYRINGE | INTRAMUSCULAR | Status: DC | PRN

## 2023-11-16 MED ORDER — METOPROLOL TARTRATE 25 MG PO TABS
12.5000 mg | ORAL_TABLET | Freq: Two times a day (BID) | ORAL | Status: DC
  Administered 2023-11-16 (×2): 12.5 mg via ORAL
  Filled 2023-11-16 (×3): qty 1

## 2023-11-16 MED ORDER — MENTHOL 3 MG MT LOZG
1.0000 | LOZENGE | OROMUCOSAL | Status: DC | PRN
  Filled 2023-11-16: qty 9

## 2023-11-16 MED ORDER — FLUTICASONE PROPIONATE 50 MCG/ACT NA SUSP
2.0000 | Freq: Every day | NASAL | Status: DC
  Filled 2023-11-16: qty 16

## 2023-11-16 MED ORDER — POLYETHYLENE GLYCOL 3350 17 G PO PACK
17.0000 g | PACK | Freq: Every day | ORAL | Status: DC
  Administered 2023-11-16: 17 g via ORAL
  Filled 2023-11-16 (×2): qty 1

## 2023-11-16 MED ORDER — LACTATED RINGERS IV SOLN: INTRAVENOUS | Status: DC

## 2023-11-16 MED ORDER — MELATONIN 3 MG PO TABS: 3.0000 mg | ORAL_TABLET | Freq: Every evening | ORAL | Status: DC | PRN

## 2023-11-16 MED ORDER — INSULIN GLARGINE 100 UNIT/ML ~~LOC~~ SOLN
6.0000 [IU] | Freq: Every day | SUBCUTANEOUS | Status: DC
  Administered 2023-11-16: 6 [IU] via SUBCUTANEOUS
  Filled 2023-11-16: qty 0.06

## 2023-11-16 MED ORDER — SODIUM CHLORIDE 0.9 % IV BOLUS
1000.0000 mL | Freq: Once | INTRAVENOUS | Status: AC
  Administered 2023-11-16: 1000 mL via INTRAVENOUS

## 2023-11-16 MED ORDER — ONDANSETRON HCL 4 MG/2ML IJ SOLN
4.0000 mg | Freq: Four times a day (QID) | INTRAMUSCULAR | Status: DC | PRN
Start: 2023-11-16 — End: 2023-11-21
  Administered 2023-11-16 – 2023-11-18 (×9): 4 mg via INTRAVENOUS
  Filled 2023-11-16 (×9): qty 2

## 2023-11-16 MED ORDER — HYDRALAZINE HCL 20 MG/ML IJ SOLN
10.0000 mg | INTRAMUSCULAR | Status: DC | PRN
  Administered 2023-11-16 – 2023-11-18 (×5): 10 mg via INTRAVENOUS
  Filled 2023-11-16 (×5): qty 1

## 2023-11-16 MED ORDER — MOMETASONE FURO-FORMOTEROL FUM 200-5 MCG/ACT IN AERO
2.0000 | INHALATION_SPRAY | Freq: Two times a day (BID) | RESPIRATORY_TRACT | Status: DC
  Administered 2023-11-16 – 2023-11-21 (×2): 2 via RESPIRATORY_TRACT
  Filled 2023-11-16 (×2): qty 8.8

## 2023-11-16 MED ORDER — SODIUM CHLORIDE 0.9 % IV SOLN: INTRAVENOUS | Status: DC

## 2023-11-16 MED ORDER — LORATADINE 10 MG PO TABS
10.0000 mg | ORAL_TABLET | Freq: Every day | ORAL | Status: DC
Start: 2023-11-16 — End: 2023-11-16

## 2023-11-16 MED ORDER — NALOXONE HCL 0.4 MG/ML IJ SOLN: 0.4000 mg | INTRAMUSCULAR | Status: DC | PRN

## 2023-11-16 MED ORDER — ASPIRIN 81 MG PO TBEC
81.0000 mg | DELAYED_RELEASE_TABLET | Freq: Every day | ORAL | Status: DC
  Administered 2023-11-16 – 2023-11-21 (×4): 81 mg via ORAL
  Filled 2023-11-16 (×6): qty 1

## 2023-11-16 MED ORDER — ACETAMINOPHEN 650 MG RE SUPP: 650.0000 mg | Freq: Four times a day (QID) | RECTAL | Status: DC | PRN

## 2023-11-16 MED ORDER — ALBUTEROL SULFATE (2.5 MG/3ML) 0.083% IN NEBU: 2.5000 mg | INHALATION_SOLUTION | RESPIRATORY_TRACT | Status: DC | PRN

## 2023-11-16 MED ORDER — BISACODYL 10 MG RE SUPP
10.0000 mg | Freq: Once | RECTAL | Status: AC
  Administered 2023-11-16: 10 mg via RECTAL
  Filled 2023-11-16: qty 1

## 2023-11-16 MED ORDER — DOCUSATE SODIUM 100 MG PO CAPS
100.0000 mg | ORAL_CAPSULE | Freq: Two times a day (BID) | ORAL | Status: DC | PRN
  Administered 2023-11-16: 100 mg via ORAL
  Filled 2023-11-16: qty 1

## 2023-11-16 MED ORDER — ACETAMINOPHEN 325 MG PO TABS: 650.0000 mg | ORAL_TABLET | Freq: Four times a day (QID) | ORAL | Status: DC | PRN

## 2023-11-16 MED ORDER — INSULIN ASPART 100 UNIT/ML IJ SOLN
0.0000 [IU] | Freq: Three times a day (TID) | INTRAMUSCULAR | Status: DC
  Administered 2023-11-16: 2 [IU] via SUBCUTANEOUS
  Administered 2023-11-16 – 2023-11-17 (×2): 1 [IU] via SUBCUTANEOUS
  Administered 2023-11-17: 2 [IU] via SUBCUTANEOUS
  Administered 2023-11-17 – 2023-11-18 (×3): 1 [IU] via SUBCUTANEOUS
  Administered 2023-11-20: 2 [IU] via SUBCUTANEOUS
  Filled 2023-11-16: qty 0.09

## 2023-11-16 MED ORDER — SENNOSIDES-DOCUSATE SODIUM 8.6-50 MG PO TABS
2.0000 | ORAL_TABLET | Freq: Two times a day (BID) | ORAL | Status: DC
  Administered 2023-11-16: 2 via ORAL
  Filled 2023-11-16 (×4): qty 2

## 2023-11-16 MED ORDER — LORAZEPAM 2 MG/ML IJ SOLN
0.5000 mg | Freq: Four times a day (QID) | INTRAMUSCULAR | Status: DC | PRN
  Administered 2023-11-16: 0.5 mg via INTRAVENOUS
  Filled 2023-11-16: qty 1

## 2023-11-16 NOTE — Progress Notes (Signed)
  Echocardiogram 2D Echocardiogram has been performed.  Leda Roys RDCS 11/16/2023, 3:22 PM

## 2023-11-16 NOTE — Assessment & Plan Note (Signed)
 BP elevated - Stop metoprolol - Start labetalol - Hold losartan, spironolactone and furosemide due to AKI - PRN hydralazine

## 2023-11-16 NOTE — Assessment & Plan Note (Addendum)
 A1c 6.5%, glucoses here normal - Hold home glargine given poor oral intake - Sliding scale corrections

## 2023-11-16 NOTE — Assessment & Plan Note (Signed)
 No evidence of flare - Continue ICS/LABA

## 2023-11-16 NOTE — ED Notes (Signed)
 Patient was unable to tolerate PO meds. Patient got sick before able to swallow pill.

## 2023-11-16 NOTE — Assessment & Plan Note (Signed)
 Class 1 BMI 32

## 2023-11-16 NOTE — Assessment & Plan Note (Signed)
 Nephrotic range proteinuria Diabetic nephropathy Baseline Cr in CE from 2023 is 1.2, P/C ratio >3 at that time.  None more recent.  Was on Lasix, losartan, spironolactone PTA, ran out "a few weeks ago".    Here, Cr 2.4 on admission, started on fluids and actually worse in the last 48 hours. No uremia or acidosis or hyperkalemia.    Renal US showed no hydro, but did require I/O catheter of only 400cc last night.  Urine protein/creat 4 g/g, UA with hyaline casts, no cells.  Unclear to me if she is at a new baseline progressed since 2023 vs congestive nephropathy vs pre-renal injury from vomiting.  FeNA suggests one of the latter two - Consult Nephrology, appreciate expertise - Continue IV fluids - Trend Cr - Strict I/Os

## 2023-11-16 NOTE — Assessment & Plan Note (Signed)
 Unclear cause.  No diarrhea to support GE.  No prior recurrent pattern to suggest gastroparesis.  CT reassuring, rules out obstruction, pancreatitis.  Wonder if this is bad constipation, as she reports? - Start MiraLAX, bisacodyl suppository, and twice daily senna - IV fluids -CLD

## 2023-11-16 NOTE — Assessment & Plan Note (Signed)
 Echocardiogram this admission confirms EF 20 to 25%, grade 2 diastolic dysfunction.  Normal valves.  CT shows small pleural effusions bilaterally but no edema in lower lungs and clinically does not appear in CHF at all.  High BP and normal lactate militate against dry HF. - Hold home furosemide, losartan, spironolactone due to AKI

## 2023-11-16 NOTE — Progress Notes (Signed)
  Progress Note   Patient: Cassie Mata:[id number] DOB: 05/18/1966 DOA: 11/15/2023     0 DOS: the patient was seen and examined on 11/16/2023 at at 8:20 AM      Brief hospital course: 58 yo F with obesity, DM, HTN hx CVA and asthma who presented with few days constipation, vomiting and nausea and abdominal cramps.  In the ER, CT abdomen and pelvis unremarkable.  Noted incidentally to have AKI, Cr 2.38 mg/dL.  Admitted on fluids     Assessment and Plan: * AKI (acute kidney injury) on CKD IIIa Baseline Cr in CE from 2023 is 1.2.  None more recent.  Here, Cr 2.4 on admission, no improvement overnight with fluids, but very little UOP. - Increase fluids - Check UA, PC ratio, FeNa - Check renal US - Trend BMP    Obesity (BMI 30-39.9) Class 1 BMI 32  Transaminitis Unclear cause.  No hyperbilirubinemia.  No RUQ pain. - Trend LFTs  Moderate persistent asthma No evidence of flare - Continue ICS/LABA  DM2 (diabetes mellitus, type 2) (HCC) A1c 6.5%, glucoses here normal - Hold home glargine given poor oral intake - Sliding scale corrections  Chronic systolic CHF (congestive heart failure) (HCC) Echocardiogram this admission confirms EF 20 to 25%, grade 2 diastolic dysfunction.  No valves.  She is not in heart failure - Hold home furosemide, losartan, spironolactone due to AKI  Cerebrovascular disease - Continue home aspirin  Nausea & vomiting & constipation Unclear cause.  No diarrhea to support GE.  No prior recurrent pattern to suggest gastroparesis.  CT reassuring, rules out obstruction, pancreatitis.  Wonder if this is bad constipation, as she reports? - Start MiraLAX, bisacodyl suppository, and twice daily senna - IV fluids -CLD    Essential hypertension BP elevated - Continue home metoprolol - Hold losartan, spironolactone and furosemide due to AKI - PRN hydralazine          Subjective: Still nauseated, still feels dehydrated, still  constipated.     Physical Exam: BP (!) 165/91   Pulse (!) 102   Temp 98.3 F (36.8 C) (Oral)   Resp 20   Ht 5' (1.524 m)   Wt 74.4 kg   LMP 09/06/2015 (Exact Date)   SpO2 100%   BMI 32.03 kg/m   The patient was seen and examined, exam unremarkable, abdomen soft, crampy throughout but no focal tenderness, or guarding.  Mentation good.  Data Reviewed: This is a no charge note, for further details, please see the H&P by my partner Dr. Dimas Aguas from earlier today  Family Communication:     Disposition: Status is: Inpatient         Author: Alberteen Sam, MD 11/16/2023 4:20 PM  For on call review [email or web address].

## 2023-11-16 NOTE — Progress Notes (Signed)
   11/16/23 0858  TOC Brief Assessment  Insurance and Status Reviewed  Patient has primary care physician Yes  Home environment has been reviewed Resides in single family home  Prior level of function: Independent with ADLs at baseline  Prior/Current Home Services No current home services  Social Drivers of Health Review SDOH reviewed no interventions necessary  Readmission risk has been reviewed Yes  Transition of care needs no transition of care needs at this time

## 2023-11-16 NOTE — ED Notes (Signed)
 Gave patient Ice chips to try and help her dry throat. Patient called out and said it felt like her throat was closing and having hard time swallowing. This writer got the light and looked into the patients throat and there was some redness but no swelling noted. Airway is open and patent at this time.  Advised patient to call out if it was to get worse. Patient denies any difficulty breathing at this time.

## 2023-11-16 NOTE — Assessment & Plan Note (Addendum)
 Unclear cause.  No hyperbilirubinemia.  No RUQ pain. - Trend LFTs

## 2023-11-16 NOTE — Hospital Course (Addendum)
 58 yo F with obesity, DM, HTN hx CVA and asthma who presented with few days constipation, vomiting and nausea and abdominal cramps.  In the ER, CT abdomen and pelvis unremarkable.  Noted incidentally to have AKI, Cr 2.38 mg/dL.  Admitted on fluids

## 2023-11-16 NOTE — Assessment & Plan Note (Signed)
-   Continue home aspirin 

## 2023-11-16 NOTE — Progress Notes (Signed)
 Mobility Specialist - Progress Note   11/16/23 1215  Mobility  Activity Ambulated with assistance to bathroom  Level of Assistance Modified independent, requires aide device or extra time  Activity Response Tolerated well  Mobility Referral Yes  Mobility visit 1 Mobility  Mobility Specialist Start Time (ACUTE ONLY) 1207  Mobility Specialist Stop Time (ACUTE ONLY) 1213  Mobility Specialist Time Calculation (min) (ACUTE ONLY) 6 min   Pt received in bathroom requesting assistance back to bed. C/o nausea throughout session. No other complaints during session. Pt to bed after session with all needs met.   [facility]

## 2023-11-16 NOTE — H&P (Addendum)
 History and Physical      Cassie Mata ZOX:[id number] DOB: Mar 24, 1966 DOA: 11/15/2023; DOS: 11/16/2023  PCP: Eather Colas, FNP  Patient coming from: home   I have personally briefly reviewed patient's old medical records in [facility]  Chief Complaint: Nausea/vomiting  HPI: Cassie Mata is a 58 y.o. female with medical history significant for type 2 diabetes mellitus, chronic systolic heart failure, essential hypertension, moderate persistent asthma, who is admitted to [facility] on 11/15/2023 with acute kidney injury after presenting from home to [facility] ED complaining of nausea vomiting.   The patient reports 3 to 4 days of recurrent nausea resulting in at least 5-6 episodes of nonbloody, nonbilious emesis over that timeframe, with most recent such episode occurring just prior to presenting to [facility] emergency department this evening.  As a consequence of his recurrent nausea/vomiting over the last few days, she notes significant decrease in oral intake over that timeframe, and reports inability to take her home p.o. medications over that timeframe as a consequence of this.  She reports that this has been associated with mild generalized crampy abdominal discomfort without radiation.  She denies any associated diarrhea, melena, or hematochezia.  No recent subjective fever, chills, rigors, or generalized myalgias.  She also denies any recent dysuria or gross hematuria.  Per chart review, most recent prior serum creatinine data point was noted to be 1.24 on 04/10/2022.  Her medical history is notable for a documented history of chronic systolic heart failure, although no prior echocardiogram is available in our EMR at this time.  Notable outpatient medications include losartan, Lasix, spironolactone.  Her medical history is also notable for poorly controlled type 2 diabetes mellitus, with most recent hemoglobin A1c found to be greater than 14 in July 2022.  Per chart  review, most recent prior liver enzymes were checked on 04/10/2022 and were notable at that time for the following: Alkaline phosphatase 80, total bilirubin 0.4, AST 36, ALT 26.  Earlier on 11/15/2023, she had initially presented to [facility] emergency department for further evaluation and management of her recent nausea/vomiting.  She had labs performed at [facility] emergency department while in triage, but left prior to being seen due to associated wait time.  After leaving [facility] emergency department, she presented to [facility] emergency department for further evaluation management of the above.    [facility] ED Course:  Vital signs in the ED were notable for the following: Afebrile; heart rates in the 90s; systolic blood pressures in the 180s; respiratory rate 18, oxygen saturation 98% on room air.  Labs were notable for the following: CMP was notable for the following: Sodium 140, potassium 4.6, bicarbonate 21, anion gap 12, BUN 37, creatinine 2.48, glucose 175, calcium adjusted for mild hypoalbuminemia noted to be 9.6, avidin 2.4, alkaline phosphatase 35, total bilirubin 0.9, AST 89, ALT 76.  CBC notable for white cell count 9600, hemoglobin 12.5.  COVID, influenza, RSV PCR were all negative.  Per my interpretation, EKG in ED demonstrated the following: No EKG performed in the ED today.  Imaging in the ED, per corresponding formal radiology read, was notable for the following: CT abdomen/pelvis without contrast showed no evidence of acute intra-abdominal or acute intrapelvic process this imaging showed pancolonic diverticulosis without any evidence of superimposed acute inflammatory changes.  This imaging also showed cholelithiasis without any evidence of gallbladder wall thickening or pericholecystic fluid to suggest acute cholecystitis, nor was there any evidence of common bile  duct dilation or choledocholithiasis.  Additionally, today CT abdomen/pelvis showed no evidence of hepatic  pathology, and also showed normal-appearing kidneys without any evidence of postrenal obstruction.  While in the ED, the following were administered: Home Lopressor 25 mg p.o. x 1 dose, Zofran 4 mg IV x 1, lactated Ringer's x 1 L bolus.  Subsequently, the patient was admitted for further evaluation management of presenting acute kidney injury in the setting of recurrent nausea/vomiting over the last 3 to 4 days, with presenting labs also notable for transaminitis.    Review of Systems: As per HPI otherwise 10 point review of systems negative.   Past Medical History:  Diagnosis Date   Arthritis    Asthma    Cataract    NS OU   CHF (congestive heart failure) (HCC)    Coma (HCC)    Depression    Diabetes mellitus    Diabetic retinopathy (HCC)    OU   Fibromyalgia    Gall stone    Gastroesophageal reflux disease    Hiatal hernia    Insomnia 08/07/2014   Myalgia and myositis, unspecified 01/13/2013   Stroke (HCC) 2007   Pt. evaluated at [facility] had left sided weakness.  Reportedly no cerebral imaging ever done.  Records we have received from [facility] dated 2011 do not document this.  Rest of records in storage and have not yet sent.    Past Surgical History:  Procedure Laterality Date   CESAREAN SECTION  1987,1988,1996,2006   INNER EAR SURGERY     TUBAL LIGATION      Social History:  reports that she quit smoking about 28 years ago. Her smoking use included cigarettes. She started smoking about 45 years ago. She has a 8.5 pack-year smoking history. She has never used smokeless tobacco. She reports that she does not drink alcohol and does not use drugs.   Allergies  Allergen Reactions   Eszopiclone     Other reaction(s): Other (See Comments) Generic for lunesta-caused stroke like symptoms    Gabapentin Other (See Comments)    Other reaction(s): GI Upset (intolerance)   Ivp Dye [Iodinated Contrast Media] Nausea Only   Other     "allergy  medications"= makes heart flutter   Prednisone Other (See Comments)    Elevates blood sugar   Ultracet [Tramadol-Acetaminophen] Nausea And Vomiting    Family History  Problem Relation Age of Onset   Cirrhosis Mother        alcoholic   Diabetes Father    Cancer Father        Brain   Diabetes Sister    Alcohol abuse Sister    Cirrhosis Sister    Arthritis Sister    Cancer Sister        ?possible gastric cancer   Mental illness Son        suicidal thoughts   ADD / ADHD Son    ADD / ADHD Son    ADD / ADHD Son    Mental illness Son        schizophrenia   Blindness Son        Legal blindness    Family history reviewed and not pertinent    Prior to Admission medications   Medication Sig Start Date End Date Taking? Authorizing Provider  AgaMatrix Ultra-Thin Lancets MISC Check sugars twice daily before meals 03/24/21   Raspet, Erin K, PA-C  albuterol (VENTOLIN HFA) 108 (90 Base) MCG/ACT inhaler Inhale 1-2 puffs into the lungs every  6 (six) hours as needed for wheezing. 04/24/22   Verlee Monte, MD  aspirin 81 MG tablet Take 81 mg by mouth daily.     [provider]  atorvastatin (LIPITOR) 80 MG tablet Take 80 mg by mouth daily. 11/08/20   [provider]  azelastine (ASTELIN) 0.1 % nasal spray Place 2 sprays into both nostrils 2 (two) times daily as needed for rhinitis. Use in each nostril as directed 04/24/22   Verlee Monte, MD  Blood Glucose Monitoring Suppl [facility] PRESTO) w/Device KIT Check blood sugars twice daily before meals 10/20/17   Julieanne Manson, MD  cholecalciferol (VITAMIN D) 1000 UNITS tablet Take 2,000 Units by mouth at bedtime.     [provider]  cyclobenzaprine (FLEXERIL) 10 MG tablet Take 1 tablet (10 mg total) by mouth 2 (two) times daily as needed for muscle spasms. 10/31/17   Sharlene Dory, DO  diphenhydrAMINE (BENADRYL) 25 mg capsule Take 25 mg by mouth every 6 (six) hours as needed for allergies.    [provider]  fluticasone (FLONASE) 50 MCG/ACT nasal spray Place 2 sprays into both nostrils daily. 04/24/22   Verlee Monte, MD  fluticasone-salmeterol (ADVAIR) 250-50 MCG/ACT AEPB Inhale 1 puff into the lungs in the morning and at bedtime. 04/24/22   Verlee Monte, MD  furosemide (LASIX) 40 MG tablet Take 1 tablet (40 mg total) by mouth daily for 5 days. 07/30/20 06/04/21  Dahlia Byes A, FNP  glucose blood (AGAMATRIX PRESTO TEST) test strip Check sugars twice daily before meals 03/24/21   Rehman, Areeg N, DO  HYDROcodone-acetaminophen (NORCO/VICODIN) 5-325 MG tablet Take 1 tablet by mouth every 12 (twelve) hours as needed for moderate pain. 05/14/21   Valeria Batman, MD  ibuprofen (ADVIL) 200 MG tablet Take 200 mg by mouth daily as needed for headache or mild pain.    [provider]  insulin aspart (FIASP) 100 UNIT/ML FlexTouch Pen Inject 10 Units into the skin with breakfast, with lunch, and with evening meal. Patient not taking: Reported on 06/04/2021 04/12/21   Inez Catalina, MD  Insulin Syringes, Disposable, U-100 0.3 ML MISC 1 Syringe by Does not apply route as needed. Patient not taking: Reported on 06/04/2021 03/24/21   Inez Catalina, MD  isosorbide mononitrate (IMDUR) 30 MG 24 hr tablet Take 30 mg by mouth daily. Patient not taking: Reported on 06/04/2021 11/08/20   [provider]  lidocaine (LIDODERM) 5 % Place 1 patch onto the skin daily. Remove & Discard patch within 12 hours or as directed by MD Patient not taking: Reported on 06/04/2021 03/24/21   Rehman, Areeg N, DO  loratadine (CLARITIN) 10 MG tablet Take 1 tablet (10 mg total) by mouth daily. 04/24/22   Verlee Monte, MD  losartan (COZAAR) 25 MG tablet TAKE 2 TABLETS BY MOUTH EVERY DAY 12/31/21   Marolyn Haller, MD  metoprolol tartrate (LOPRESSOR) 25 MG tablet TAKE 1/2 (ONE-HALF) TABLET BY MOUTH TWICE DAILY 04/22/21   Rehman, Areeg N, DO  Multiple Vitamin (MULTIVITAMIN) tablet Take 1 tablet by mouth daily.     [provider]  nitroGLYCERIN (NITROSTAT) 0.4 MG SL tablet Place 1 tablet (0.4 mg total) under the tongue every 5 (five) minutes as needed for chest pain. 03/24/21 06/24/21  Rehman, Areeg N, DO  pantoprazole (PROTONIX) 40 MG tablet Take 1 tablet (40 mg total) by mouth daily. Reported on 09/13/2015 Patient not taking: Reported on 06/04/2021 07/19/18   Wallis Bamberg, PA-C  TECHLITE PEN NEEDLES 32G X 4 MM MISC USE TO ADMINISTER INSULIN ONCE DAILY 08/27/21   Doran Stabler, DO  TRESIBA FLEXTOUCH 100 UNIT/ML FlexTouch Pen INJECT TWELVE UNITS UNDER THE SKIN DAILY 12/10/21   Marolyn Haller, MD     Objective    Physical Exam: Vitals:   11/15/23 2230 11/15/23 2355 11/15/23 2356  BP: (!) 180/118 (!) 181/112   Pulse:  94   Resp:  18   Temp:   98.6 F (37 C)  TempSrc:   Oral  SpO2:  98%     General: appears to be stated age; alert, oriented Skin: warm, dry, no rash Head:  AT/[place] Mouth:  Oral mucosa membranes appear dry, normal dentition Neck: supple; trachea midline Heart:  RRR; did not appreciate any M/R/G Lungs: CTAB, did not appreciate any wheezes, rales, or rhonchi Abdomen: + BS; soft, ND, mild generalized tenderness to palpation, in the absence of any associated guarding, rigidity, or rebound tenderness Vascular: 2+ pedal pulses b/l; 2+ radial pulses b/l Extremities: Trace edema bilateral lower extremities, no muscle wasting Neuro: strength and sensation intact in upper and lower extremities b/l    Labs on Admission: I have personally reviewed following labs and imaging studies  CBC: Recent Labs  Lab 11/15/23 1925  WBC 9.6  NEUTROABS 7.7  HGB 12.5  HCT 40.3  MCV 91.4  PLT 300   Basic Metabolic Panel: Recent Labs  Lab 11/15/23 1925  NA 140  K 4.6  CL 107  CO2 21*  GLUCOSE 175*  BUN 37*  CREATININE 2.48*  CALCIUM 8.4*   GFR: CrCl cannot be calculated (Unknown ideal weight.). Liver Function Tests: Recent Labs  Lab 11/15/23 1925  AST 89*  ALT 76*   ALKPHOS 35*  BILITOT 0.9  PROT 6.7  ALBUMIN 2.4*   No results for input(s): "LIPASE", "AMYLASE" in the last 168 hours. No results for input(s): "AMMONIA" in the last 168 hours. Coagulation Profile: No results for input(s): "INR", "PROTIME" in the last 168 hours. Cardiac Enzymes: No results for input(s): "CKTOTAL", "CKMB", "CKMBINDEX", "TROPONINI" in the last 168 hours. BNP (last 3 results) No results for input(s): "PROBNP" in the last 8760 hours. HbA1C: No results for input(s): "HGBA1C" in the last 72 hours. CBG: No results for input(s): "GLUCAP" in the last 168 hours. Lipid Profile: No results for input(s): "CHOL", "HDL", "LDLCALC", "TRIG", "CHOLHDL", "LDLDIRECT" in the last 72 hours. Thyroid Function Tests: No results for input(s): "TSH", "T4TOTAL", "FREET4", "T3FREE", "THYROIDAB" in the last 72 hours. Anemia Panel: No results for input(s): "VITAMINB12", "FOLATE", "FERRITIN", "TIBC", "IRON", "RETICCTPCT" in the last 72 hours. Urine analysis:    Component Value Date/Time   COLORURINE YELLOW 03/22/2021 1604   APPEARANCEUR HAZY (A) 03/22/2021 1604   LABSPEC 1.027 03/22/2021 1604   PHURINE 6.0 03/22/2021 1604   GLUCOSEU >=500 (A) 03/22/2021 1604   HGBUR NEGATIVE 03/22/2021 1604   BILIRUBINUR NEGATIVE 03/22/2021 1604   KETONESUR 5 (A) 03/22/2021 1604   PROTEINUR 100 (A) 03/22/2021 1604   NITRITE NEGATIVE 03/22/2021 1604   LEUKOCYTESUR NEGATIVE 03/22/2021 1604    Radiological Exams on Admission: CT ABDOMEN PELVIS WO CONTRAST Result Date: 11/15/2023 CLINICAL DATA:  Acute nonlocalized abdominal pain EXAM: CT ABDOMEN AND PELVIS WITHOUT CONTRAST TECHNIQUE: Multidetector CT imaging of the abdomen and pelvis was performed following the standard protocol without IV contrast. RADIATION DOSE REDUCTION: This exam was performed according to the departmental dose-optimization program which includes automated exposure control, adjustment of the mA and/or kV according to patient size and/or  use of iterative reconstruction technique. COMPARISON:  03/22/2021 FINDINGS: Lower chest: Small bilateral pleural effusions, right greater than left. Hypoattenuation of the cardiac blood pool is in keeping with at least mild anemia. Small hernia. No acute abnormality. Hepatobiliary: Cholelithiasis without superimposed pericholecystic inflammatory change. Liver unremarkable on this noncontrast examination. No intra or extrahepatic biliary ductal dilation. Pancreas: Unremarkable. No pancreatic ductal dilatation or surrounding inflammatory changes. Spleen: Unremarkable Adrenals/Urinary Tract: Adrenal glands are unremarkable. Kidneys are normal, without renal calculi, focal lesion, or hydronephrosis. Bladder is unremarkable. Stomach/Bowel: Moderate pancolonic diverticulosis. Stomach, small bowel, and large bowel are otherwise unremarkable. Appendix normal. No evidence of obstruction or focal inflammation. No free intraperitoneal gas or fluid. Vascular/Lymphatic: Aortic atherosclerosis. No enlarged abdominal or pelvic lymph nodes. Reproductive: Uterus and bilateral adnexa are unremarkable. Other: Diffuse subcutaneous body wall edema is present in keeping with anasarca Musculoskeletal: No acute or significant osseous findings. IMPRESSION: 1. No acute intra-abdominal pathology identified. No definite radiographic explanation for the patient's reported symptoms. 2. Small bilateral pleural effusions, right greater than left. Diffuse subcutaneous body wall edema in keeping with anasarca. 3. Cholelithiasis. 4. Moderate pancolonic diverticulosis without superimposed acute inflammatory change. Aortic Atherosclerosis (ICD10-I70.0). Electronically Signed   By: Helyn Numbers M.D.   On: 11/15/2023 23:32   DG Abdomen 1 View Result Date: 11/15/2023 CLINICAL DATA:  Abdominal pain and vomiting. EXAM: ABDOMEN - 1 VIEW COMPARISON:  CT 03/22/2021 FINDINGS: No bowel dilatation to suggest obstruction. Moderate stool in the colon. No  abnormal rectal distention. No evidence of free air. Gallstones on prior CT are not seen by radiograph. There are multiple pelvic phleboliths. Vascular calcifications in the pelvis. Included lower lung bases are clear. No acute osseous findings. IMPRESSION: Normal bowel gas pattern. Moderate stool in the colon. Electronically Signed   By: Narda Rutherford M.D.   On: 11/15/2023 19:55      Assessment/Plan    Principal Problem:   AKI (acute kidney injury) (HCC) Active Problems:   Essential hypertension   Nausea & vomiting   Chronic systolic CHF (congestive heart failure) (HCC)   DM2 (diabetes mellitus, type 2) (HCC)   Moderate persistent asthma   Transaminitis   Allergic rhinitis     #) Acute Kidney Injury:   Presenting creatinine 2.48 compared to most recent prior value of 1.24 on 04/10/2022.  Suspect a prerenal contribution from intravascular depletion as a result of dehydration send Clydie Braun recent increase in GI losses in the form of 3 to 4 days of recurrent nausea/vomiting with concomitant significant decline in oral intake over that timeframe, potentially compounded by multiple outpatient diuretic medications while also noting outpatient use of losartan.   Of note, today CT abdomen/pelvis showed normal-appearing kidneys, without any evidence of postrenal obstruction.   Noting the patient's documented history of chronic systolic heart failure, we will proceed with gentle IV fluid resuscitative efforts, while closely monitoring for ensuing evidence of acute volume overload, as further detailed below.  In the setting of presenting transaminitis and outpatient use of high intensity atorvastatin, will also check CPK level.  Will pursue additional diagnostic evaluation, as further detailed below.  Plan: monitor strict I's & O's and daily weights. Attempt to avoid nephrotoxic agents.  For now, will hold home spironolactone, Lasix, and losartan.  Refrain from NSAIDs. Repeat CMP in the morning.  Check serum magnesium level.  Check urinalysis with microscopy.  Add-on random urine sodium and random urine creatinine.  CPK level.  In the setting of her history of chronic systolic heart failure and  finding of transaminitis, will also check BNP and PTT/INR.                  #) Nausea/vomiting: Recurrent nausea over the last 3 to 4 days resulting in at least 5-6 episodes of nonbloody, nonbilious emesis over that timeframe, resulting in significant decline in oral intake over that time, inability to tolerate outpatient p.o. medications the patient still unable to tolerate p.o. while in the ED following IV Zofran.  Etiology not entirely clear at this time.  CT abdomen/pelvis showed no evidence of acute intra-abdominal or acute intrapelvic process, including no evidence of bowel obstruction or any evidence of acute pancreatitis nor any evidence of acute gallbladder or hepatic pathology.  She has no known history of diabetic gastroparesis, but is at increased risk of such in the setting of her poorly controlled diabetes, noting most recent hemoglobin A1c of greater than 14.  Her COVID, influenza, RSV PCR were all negative today.  Will check acute viral hepatitis panel given concomitant presenting transaminitis and hepatocellular distribution.  Will pursue further diagnostic evaluation and supportive measures, as outlined below.  Plan: Prn IV Zofran, as needed IV Ativan for nausea/vomiting refractory to Zofran.  Monitor strict I's and O's and daily weights.  Gentle IV fluids, as above.  Add on serum magnesium level.  Repeat CMP, CBC in the morning.  Check urinalysis, urinary drug screen, serum ethanol level, lipase, TSH.  Check updated hemoglobin A1c level.                  #) Transaminitis: Interval development of mild transaminitis relative to liver enzymes checked during the first week of August 2023, as further quantified above.  This appears to be in hepatocellular  distribution, without any elevation in total bilirubin or alkaline phosphatase to suggest biliary obstruction.  Today CT abdomen/pelvis showed no evidence of hepatic pathology nor any evidence of acute cholecystitis nor any evidence of biliary obstruction.  Given the 65-month gap in between liver enzyme data points, the acuity versus chronicity of her transaminitis is currently unclear.  He was setting of her presenting recurrent nausea/vomiting over the last few days, will also check viral hepatitis panel.  She is at increased risk for congestive hepatopathy in the setting of her history of chronic systolic heart failure.  Further diagnostic evaluation, as below.  Plan: Repeat CMP in the morning.  Hold home atorvastatin for now.  Add on BNP.  Check PTT, INR, urinary drug screen, serum ethanol level, viral hepatitis panel.  Monitor strict I's and O's and daily weights.  Follow for result of CPK level.                    #) Type 2 Diabetes Mellitus: documented history of such. Home insulin regimen: Tresiba 12 units SQ daily. Home oral hypoglycemic agents:  none. presenting blood sugar: 175. Most recent A1c noted to be greater than 14 when checked in July 2022. in terms of initial dose of basal insulin to be started during this hospitalization, will resume approximately half of outpatient dose in order to reduce risk for ensuing hypoglycemia  Plan: accuchecks QAC and HS with low dose SSI.  Glargine 6 units SQ every morning, as above.  Add on hemoglobin A1c level.                      #) Chronic systolic heart failure: documented history of such, although no echocardiogram results available per my initial chart  review.  She appears to be on several agents to be consistent with goal-directed therapy, including losartan, spironolactone.  She appears clinically intravascularly depleted.   Plan: monitor strict I's & O's and daily weights. Repeat BMP in AM. Check serum mag  level.  In the setting of suspected intravascular depletion from recent nausea/vomiting, will hold next doses of spironolactone and Lasix.  In the setting of AKI, hold home losartan for now.  Check echocardiogram in the AM.                    #) Essential Hypertension: documented h/o such, with outpatient antihypertensive regimen including losartan, Lopressor, Lasix, spironolactone.  SBP's in the ED today: 180s mmHg, with suspected contribution from patient's inability to tolerate her home oral blood pressure medications over the last few days in the setting of recurrent nausea/vomiting over that timeframe.  Of note, in the context of suspected intravascular depletion from her recent nausea/vomiting, will hold next doses of Lasix and spironolactone.  Additionally, in the setting of presenting acute kidney injury, will hold home losartan for now as well.  Plan: Close monitoring of subsequent BP via routine VS. hold home losartan, Lasix, spironolactone.  Continue home beta-blocker.  Strict I's and O's and daily weights.  Monitor on telemetry.                      #) Moderate persistent asthma: documented history thereof, without clinical e/o to suggest current exacerbation. Outpatient respiratory regimen includes Advair, prn albuterol inhaler.   Plan: Check serum magnesium level. Continue home Advair.  As needed albuterol nebulizer.                     #) Allergic Rhinitis: documented h/o such, on scheduled intranasal Flonase as well as Claritin as outpatient.    Plan: cont home Flonase and Claritin.         DVT prophylaxis: SCD's   Code Status: Full code Family Communication: I discussed patient's case with her 2 sons, who were present at bedside Disposition Plan: Per Rounding Team Consults called: none;  Admission status: Inpatient    I SPENT GREATER THAN 75  MINUTES IN CLINICAL CARE TIME/MEDICAL DECISION-MAKING IN COMPLETING THIS  ADMISSION.     Chaney Born Gilles Trimpe DO [facility] From 7PM - 7AM   11/16/2023, 12:36 AM

## 2023-11-17 ENCOUNTER — Inpatient Hospital Stay (HOSPITAL_COMMUNITY)

## 2023-11-17 DIAGNOSIS — N179 Acute kidney failure, unspecified: Secondary | ICD-10-CM | POA: Diagnosis not present

## 2023-11-17 LAB — LACTIC ACID, PLASMA: Lactic Acid, Venous: 1.2 mmol/L (ref 0.5–1.9)

## 2023-11-17 LAB — URINALYSIS, COMPLETE (UACMP) WITH MICROSCOPIC
Bilirubin Urine: NEGATIVE
Glucose, UA: 150 mg/dL — AB
Ketones, ur: NEGATIVE mg/dL
Leukocytes,Ua: NEGATIVE
Nitrite: NEGATIVE
Protein, ur: >=300 mg/dL — AB
Specific Gravity, Urine: 1.024 (ref 1.005–1.030)
pH: 5 (ref 5.0–8.0)

## 2023-11-17 LAB — BASIC METABOLIC PANEL
Anion gap: 9 (ref 5–15)
BUN: 46 mg/dL — ABNORMAL HIGH (ref 6–20)
CO2: 21 mmol/L — ABNORMAL LOW (ref 22–32)
Calcium: 7.7 mg/dL — ABNORMAL LOW (ref 8.9–10.3)
Chloride: 112 mmol/L — ABNORMAL HIGH (ref 98–111)
Creatinine, Ser: 2.77 mg/dL — ABNORMAL HIGH (ref 0.44–1.00)
GFR, Estimated: 19 mL/min — ABNORMAL LOW (ref >60)
Glucose, Bld: 159 mg/dL — ABNORMAL HIGH (ref 70–99)
Potassium: 4.3 mmol/L (ref 3.5–5.1)
Sodium: 142 mmol/L (ref 135–145)

## 2023-11-17 LAB — GLUCOSE, CAPILLARY
Glucose-Capillary: 146 mg/dL — ABNORMAL HIGH (ref 70–99)
Glucose-Capillary: 153 mg/dL — ABNORMAL HIGH (ref 70–99)
Glucose-Capillary: 151 mg/dL — ABNORMAL HIGH (ref 70–99)

## 2023-11-17 LAB — CBC
HCT: 38.6 % (ref 36.0–46.0)
Hemoglobin: 11.8 g/dL — ABNORMAL LOW (ref 12.0–15.0)
MCH: 28.6 pg (ref 26.0–34.0)
MCHC: 30.6 g/dL (ref 30.0–36.0)
MCV: 93.5 fL (ref 80.0–100.0)
Platelets: 288 10*3/uL (ref 150–400)
RBC: 4.13 MIL/uL (ref 3.87–5.11)
RDW: 13 % (ref 11.5–15.5)
WBC: 11.1 10*3/uL — ABNORMAL HIGH (ref 4.0–10.5)
nRBC: 0 % (ref 0.0–0.2)

## 2023-11-17 LAB — HEPATIC FUNCTION PANEL
ALT: 45 U/L — ABNORMAL HIGH (ref 0–44)
AST: 45 U/L — ABNORMAL HIGH (ref 15–41)
Albumin: 2.2 g/dL — ABNORMAL LOW (ref 3.5–5.0)
Alkaline Phosphatase: 29 U/L — ABNORMAL LOW (ref 38–126)
Bilirubin, Direct: 0.3 mg/dL — ABNORMAL HIGH (ref 0.0–0.2)
Indirect Bilirubin: 0.5 mg/dL (ref 0.3–0.9)
Total Bilirubin: 0.8 mg/dL (ref 0.0–1.2)
Total Protein: 5.8 g/dL — ABNORMAL LOW (ref 6.5–8.1)

## 2023-11-17 LAB — PROTEIN / CREATININE RATIO, URINE
Creatinine, Urine: 242 mg/dL
Protein Creatinine Ratio: 4.78 mg/mg{creat} — ABNORMAL HIGH (ref 0.00–0.15)
Total Protein, Urine: 1156 mg/dL

## 2023-11-17 LAB — SODIUM, URINE, RANDOM: Sodium, Ur: <10 mmol/L

## 2023-11-17 LAB — CREATININE, URINE, RANDOM: Creatinine, Urine: 251 mg/dL

## 2023-11-17 MED ORDER — SODIUM CHLORIDE 0.9 % IV SOLN
12.5000 mg | Freq: Once | INTRAVENOUS | Status: AC
  Administered 2023-11-17: 12.5 mg via INTRAVENOUS
  Filled 2023-11-17: qty 12.5

## 2023-11-17 MED ORDER — LABETALOL HCL 200 MG PO TABS
200.0000 mg | ORAL_TABLET | Freq: Three times a day (TID) | ORAL | Status: DC
  Administered 2023-11-17 – 2023-11-21 (×11): 200 mg via ORAL
  Filled 2023-11-17 (×2): qty 1
  Filled 2023-11-17: qty 2
  Filled 2023-11-17 (×3): qty 1
  Filled 2023-11-17: qty 2
  Filled 2023-11-17: qty 1
  Filled 2023-11-17: qty 2
  Filled 2023-11-17 (×4): qty 1

## 2023-11-17 NOTE — Progress Notes (Signed)
 Progress Note   Patient: Cassie Mata ZOX:[id number] DOB: Mar 01, 1966 DOA: 11/15/2023     1 DOS: the patient was seen and examined on 11/17/2023 at 11:15 AM      Brief hospital course: 58 yo F with obesity, DM, HTN hx CVA and asthma who presented with few days constipation, vomiting and nausea and abdominal cramps.  In the ER, CT abdomen and pelvis unremarkable.  Noted incidentally to have AKI, Cr 2.38 mg/dL.  Admitted on fluids     Assessment and Plan: * AKI (acute kidney injury) on CKD IIIa Nephrotic range proteinuria Diabetic nephropathy Baseline Cr in CE from 2023 is 1.2, P/C ratio >3 at that time.  None more recent.  Was on Lasix, losartan, spironolactone PTA, ran out "a few weeks ago".    Here, Cr 2.4 on admission, started on fluids and actually worse in the last 48 hours. No uremia or acidosis or hyperkalemia.  Very mild anasarca.  Renal US showed no hydro, but did require I/O catheter of only 400cc last night.  Urine protein/creat 4 g/g, UA with hyaline casts, no cells.  Unclear to me if she is at a new baseline progressed since 2023 vs congestive nephropathy vs pre-renal injury from vomiting.  FeNA suggests one of the latter two - Consult Nephrology, appreciate expertise - Continue IV fluids - Trend Cr - Strict I/Os     Transaminitis Unclear cause.  No hyperbilirubinemia.  No RUQ pain.   Treding down.  Congestive? - Trend LFTs - Obtain US abdomen   Nausea & vomiting & constipation Epigastric abdominal pain Unclear cause of vomiting or pain.  No diarrhea to support GE.  No prior recurrent pattern to suggest gastroparesis.  Lipase normal.  CT reassuring, rules out obstruction, pancreatitis, gallbladder disease, abscess.  BUN only 46.  Was admitted in 2022 for constipation related vomiting, but has had a large BM here and no improvement. - Continue MiraLAX, bisacodyl suppository, and twice daily senna - IV fluids -CLD    Chronic systolic CHF (congestive heart  failure) (HCC) Echocardiogram this admission confirms EF 20 to 25%, grade 2 diastolic dysfunction.  Normal valves.  CT shows small pleural effusions bilaterally but no edema in lower lungs and clinically does not appear in CHF at all.  High BP and normal lactate militate against dry HF. - Hold home furosemide, losartan, spironolactone due to AKI  Obesity (BMI 30-39.9) Class 1 BMI 32  Moderate persistent asthma No evidence of flare - Continue ICS/LABA  DM2 (diabetes mellitus, type 2) (HCC) A1c 6.5%, glucoses here normal - Hold home glargine given poor oral intake - Sliding scale corrections  Cerebrovascular disease - Continue home aspirin  Essential hypertension BP elevated - Stop metoprolol - Start labetalol - Hold losartan, spironolactone and furosemide due to AKI - PRN hydralazine          Subjective: Patient still vomiting everything she eats.  She still has epigastric abdominal pain.  She still is miserable.  Needed In-N-Out cath overnight for 400 cc urine.  Had a large liquid bowel movement in bed overnight.     Physical Exam: BP (!) 178/93   Pulse (!) 105   Temp 98.9 F (37.2 C) (Oral)   Resp 20   Ht 5' (1.524 m)   Wt (!) 167.5 kg Comment: standing  LMP 09/06/2015 (Exact Date)   SpO2 93%   BMI 72.12 kg/m   Adult female, lying in bed, appears weak and tired, eyelids puffy RRR, I do not appreciate  murmurs, no peripheral edema, no JVD, she has mild anasarca Respiratory normal, lungs clear without rales or wheezes Abdomen soft she has epigastric discomfort but no guarding, no rebound Attention diminished, keeps eyes closed, oriented to person, place, and time, severe generalized weakness, face symmetric, speech fluent     Data Reviewed: Discussed with nephrology Basic metabolic panel shows creatinine up to 2.7, bicarb slightly down to 21, chloride up, BUN 46 LFTs improved to 45, total bilirubin normal Lactate normal CBC shows no leukocytosis,  minimal anemia, thrombocythemia normal TSH normal Renal ultrasound shows no hydronephrosis, only chronic medical renal disease Echocardiogram shows EF 20 to 25%, no significant change from previous   Family Communication: Children at the bedside    Disposition: Status is: Inpatient Patient presented with nonspecific nausea and vomiting.  Found to have renal failure.  The constellation of symptoms is somewhat unclear at the moment if this is driven by heart failure (congestive nephropathy) or if she is vomiting, hypovolemic and that led to some ischemic ATN  Nephrology consulted, will see tomorrow  Continue fluids, trend Cr, strict I/Os        Author: Alberteen Sam, MD 11/17/2023 4:10 PM  For on call review [email or web address].

## 2023-11-17 NOTE — Progress Notes (Signed)
 I bladder scanned this pt she only has in bladder. She hasn't peed yet today but attempted twice. This last time she told tech she wasn't going to pee due to using [facility] so she offered to go to actual toilet and she declined at the time. She threw up one more time as of about 20 minutes ago with 165ml's. She was able to keep down her cup of water with her pills for about an hour before she ended up throwing up. After that she said she didn't have the urge to pee anymore and still doesn't as of now. Danford, MD aware.

## 2023-11-17 NOTE — Progress Notes (Signed)
 Heart Failure Navigator Progress Note  Assessed for Heart & Vascular TOC clinic readiness.  Patient does not meet criteria due to with [facility], per MD note not in Heart Failure.   Navigator will sign off at this time.   Rhae Hammock, BSN, Scientist, clinical (histocompatibility and immunogenetics) Only

## 2023-11-17 NOTE — Progress Notes (Addendum)
 Encourage pt to use bathroom and voided 50ml (yellow and cloudy). Urine sample sent to lab. Bladder scan done and it read >519ml. Received order for In and out cath, which drained of urine.

## 2023-11-18 DIAGNOSIS — N179 Acute kidney failure, unspecified: Secondary | ICD-10-CM | POA: Diagnosis not present

## 2023-11-18 LAB — BASIC METABOLIC PANEL
Anion gap: 7 (ref 5–15)
BUN: 51 mg/dL — ABNORMAL HIGH (ref 6–20)
CO2: 20 mmol/L — ABNORMAL LOW (ref 22–32)
Calcium: 8.1 mg/dL — ABNORMAL LOW (ref 8.9–10.3)
Chloride: 118 mmol/L — ABNORMAL HIGH (ref 98–111)
Creatinine, Ser: 2.87 mg/dL — ABNORMAL HIGH (ref 0.44–1.00)
GFR, Estimated: 18 mL/min — ABNORMAL LOW (ref >60)
Glucose, Bld: 129 mg/dL — ABNORMAL HIGH (ref 70–99)
Potassium: 4.4 mmol/L (ref 3.5–5.1)
Sodium: 145 mmol/L (ref 135–145)

## 2023-11-18 LAB — URINALYSIS, COMPLETE (UACMP) WITH MICROSCOPIC
Bilirubin Urine: NEGATIVE
Glucose, UA: NEGATIVE mg/dL
Ketones, ur: NEGATIVE mg/dL
Leukocytes,Ua: NEGATIVE
Nitrite: NEGATIVE
Protein, ur: >=300 mg/dL — AB
Specific Gravity, Urine: 1.02 (ref 1.005–1.030)
pH: 5 (ref 5.0–8.0)

## 2023-11-18 LAB — COMPREHENSIVE METABOLIC PANEL
ALT: 43 U/L (ref 0–44)
AST: 42 U/L — ABNORMAL HIGH (ref 15–41)
Albumin: 2.3 g/dL — ABNORMAL LOW (ref 3.5–5.0)
Alkaline Phosphatase: 28 U/L — ABNORMAL LOW (ref 38–126)
Anion gap: 12 (ref 5–15)
BUN: 50 mg/dL — ABNORMAL HIGH (ref 6–20)
CO2: 19 mmol/L — ABNORMAL LOW (ref 22–32)
Calcium: 8.1 mg/dL — ABNORMAL LOW (ref 8.9–10.3)
Chloride: 116 mmol/L — ABNORMAL HIGH (ref 98–111)
Creatinine, Ser: 2.88 mg/dL — ABNORMAL HIGH (ref 0.44–1.00)
GFR, Estimated: 18 mL/min — ABNORMAL LOW (ref >60)
Glucose, Bld: 120 mg/dL — ABNORMAL HIGH (ref 70–99)
Potassium: 4.7 mmol/L (ref 3.5–5.1)
Sodium: 147 mmol/L — ABNORMAL HIGH (ref 135–145)
Total Bilirubin: 0.8 mg/dL (ref 0.0–1.2)
Total Protein: 6.1 g/dL — ABNORMAL LOW (ref 6.5–8.1)

## 2023-11-18 LAB — GLUCOSE, CAPILLARY
Glucose-Capillary: 122 mg/dL — ABNORMAL HIGH (ref 70–99)
Glucose-Capillary: 107 mg/dL — ABNORMAL HIGH (ref 70–99)
Glucose-Capillary: 123 mg/dL — ABNORMAL HIGH (ref 70–99)
Glucose-Capillary: 118 mg/dL — ABNORMAL HIGH (ref 70–99)

## 2023-11-18 LAB — CBC
HCT: 39.9 % (ref 36.0–46.0)
Hemoglobin: 11.6 g/dL — ABNORMAL LOW (ref 12.0–15.0)
MCH: 28.3 pg (ref 26.0–34.0)
MCHC: 29.1 g/dL — ABNORMAL LOW (ref 30.0–36.0)
MCV: 97.3 fL (ref 80.0–100.0)
Platelets: 250 10*3/uL (ref 150–400)
RBC: 4.1 MIL/uL (ref 3.87–5.11)
RDW: 13.3 % (ref 11.5–15.5)
WBC: 9.7 10*3/uL (ref 4.0–10.5)
nRBC: 0 % (ref 0.0–0.2)

## 2023-11-18 MED ORDER — LORAZEPAM 2 MG/ML IJ SOLN: 0.5000 mg | Freq: Four times a day (QID) | INTRAMUSCULAR | Status: DC | PRN

## 2023-11-18 MED ORDER — HYDROMORPHONE HCL 1 MG/ML IJ SOLN: 0.5000 mg | INTRAMUSCULAR | Status: DC | PRN

## 2023-11-18 MED ORDER — PROCHLORPERAZINE EDISYLATE 10 MG/2ML IJ SOLN
5.0000 mg | INTRAMUSCULAR | Status: DC | PRN
  Administered 2023-11-19 (×2): 5 mg via INTRAVENOUS
  Filled 2023-11-18 (×2): qty 2

## 2023-11-18 MED ORDER — BISACODYL 10 MG RE SUPP
10.0000 mg | Freq: Every day | RECTAL | Status: DC
  Filled 2023-11-18: qty 1

## 2023-11-18 MED ORDER — PROCHLORPERAZINE EDISYLATE 10 MG/2ML IJ SOLN
10.0000 mg | Freq: Once | INTRAMUSCULAR | Status: AC
  Administered 2023-11-18: 10 mg via INTRAVENOUS
  Filled 2023-11-18: qty 2

## 2023-11-18 NOTE — Progress Notes (Signed)
 Report called and given to 5 west nurse.

## 2023-11-18 NOTE — Progress Notes (Signed)
 Progress Note   Patient: Cassie Mata EAV:[id number] DOB: 02-06-1966 DOA: 11/15/2023     2 DOS: the patient was seen and examined on 11/18/2023 at 11:15 AM      Brief hospital course: 58 yo F with obesity, DM, HTN hx CVA and asthma who presented with few days constipation, vomiting and nausea and abdominal cramps.  In the ER, CT abdomen and pelvis unremarkable.  Noted incidentally to have AKI, Cr 2.38 mg/dL.  Admitted on fluids  Assessment and Plan: * AKI (acute kidney injury) on CKD IIIa Nephrotic range proteinuria Diabetic nephropathy Baseline Cr in CE from 2023 is 1.2, P/C ratio >3 at that time.  None more recent.  Was on Lasix, losartan, spironolactone PTA, states her last dose was on the day prior to presenting to the hospital.    Cr 2.4 on admission now 2.8 despite IV fluids. No uremia or acidosis or hyperkalemia.  Very mild anasarca.  Renal US showed no hydronephrosis.   Urine protein/creat 4 g/g, UA with hyaline casts, no cells.  Patient likely with progression of her CKD with a component of acute kidney injury. Given her decreased PO intake for over a week and she continued to take her lasix.  - Nephrology consulted, appreciate expertise - Continue IV fluids - Trend Cr - Strict I/Os   Transaminitis Unclear cause.  No hyperbilirubinemia.  No RUQ pain.   Treding down.  Acute hepatitis panel negative for hep A infection. +HepC antibody. RUQ [place] with no evidence of acute cholecystitis but noted to have cholelithiasis. Possibly related to congestive hepatopathy though patient appears hypovolemic. Also considered viral infection.  - Trend LFTs   Nausea & vomiting & constipation Epigastric abdominal pain Unclear cause of vomiting or abdominal pain.  She states symptoms started about a week prior to presenting to the hospital after eating at a restaurant. No diarrhea to support gastroenteritis.  No prior recurrent pattern to suggest gastroparesis.  Lipase normal.  CT A/P  reassuring, rules out obstruction, pancreatitis, gallbladder disease, abscess.  BUN minimally elevated 46.  Was admitted in 2022 for constipation related vomiting, but has had a large BM here and no improvement in symptoms. It is possible that patient had virus leading to her symptoms that is slowly resolving.  - Continue bisacodyl suppository - IV fluids -CLD --If patient continues to not take PO, will consider GI consult --RD consulted   Normocytic anemia     Latest Ref Rng & Units 11/18/2023    5:50 AM 11/17/2023    5:05 AM 11/16/2023    5:59 AM  CBC  WBC 4.0 - 10.5 K/uL 9.7  11.1  11.1   Hemoglobin 12.0 - 15.0 g/dL 78.2  95.6  21.3   Hematocrit 36.0 - 46.0 % 39.9  38.6  41.5   Platelets 150 - 400 K/uL 250  288  312    Possibly due to IV fluids. Will check iron panel   Leukocytosis resolved   Chronic systolic CHF (congestive heart failure) (HCC) Echocardiogram this admission confirms EF 20 to 25%, grade 2 diastolic dysfunction.  Normal valves.  CT shows small pleural effusions bilaterally but no edema in lower lungs and clinically does not appear in CHF at all.  High BP and normal lactate  - Hold home furosemide, losartan, spironolactone due to AKI  Obesity (BMI 30-39.9) Class 1 BMI 32  Moderate persistent asthma No evidence of flare - Continue ICS/LABA  DM2 (diabetes mellitus, type 2) (HCC) A1c 6.5%, glucoses here normal -  Hold home glargine given poor oral intake - Sliding scale corrections CBG (last 3)  Recent Labs    11/18/23 0741 11/18/23 1155 11/18/23 1624  GLUCAP 122* 107* 123*    Cerebrovascular disease - Continue home aspirin  Essential hypertension BP elevated Patient unable to take labetalol due to nausea. -Patient will try to take medications with antinausea medications         Subjective: Continues to vomit with ingestion of everything except water. She has avoided food and not taken her medications.     Physical Exam: BP (!) 152/83  (BP Location: Left Arm)   Pulse 86   Temp 98.2 F (36.8 C) (Oral)   Resp 18   Ht 5' (1.524 m)   Wt 80.2 kg   LMP 09/06/2015 (Exact Date)   SpO2 100%   BMI 34.53 kg/m    Physical Exam  Constitutional: Mildly uncomfortable  Cardiovascular: Normal rate, regular rhythm. No lower extremity edema  Pulmonary: Non labored breathing on room air, no wheezing or rales.  Abdominal: Soft.. Non distended and mild tender  in epigastrium   Neurological: Alert and oriented to person, place, and time. Non focal  Skin: Skin is warm and dry.    Data Reviewed:    Latest Ref Rng & Units 11/18/2023   12:33 PM 11/18/2023    5:50 AM 11/17/2023    5:05 AM  BMP  Glucose 70 - 99 mg/dL 161  096  045   BUN 6 - 20 mg/dL 51  50  46   Creatinine 0.44 - 1.00 mg/dL 4.09  8.11  9.14   Sodium 135 - 145 mmol/L 145  147  142   Potassium 3.5 - 5.1 mmol/L 4.4  4.7  4.3   Chloride 98 - 111 mmol/L 118  116  112   CO2 22 - 32 mmol/L 20  19  21    Calcium 8.9 - 10.3 mg/dL 8.1  8.1  7.7       Latest Ref Rng & Units 11/18/2023    5:50 AM 11/17/2023    5:05 AM 11/16/2023    5:59 AM  CBC  WBC 4.0 - 10.5 K/uL 9.7  11.1  11.1   Hemoglobin 12.0 - 15.0 g/dL 78.2  95.6  21.3   Hematocrit 36.0 - 46.0 % 39.9  38.6  41.5   Platelets 150 - 400 K/uL 250  288  312       Family Communication: Children at the bedside    Disposition: Status is: Inpatient Further management of Renal failure, etiology remains unclear. Continues to have poor oral intake as well.         Author: Marolyn Haller, MD 11/18/2023 6:07 PM  For on call review [email or web address].

## 2023-11-18 NOTE — Progress Notes (Signed)
 Pt only had 100 ml urine output during PM shift. Provider made aware and advised to in/out cath pt. 350 ml emptied from pt bladder.

## 2023-11-18 NOTE — Progress Notes (Signed)
 Pt refused Dulera inhaler. Pt states she takes PRN only at home.

## 2023-11-18 NOTE — Consult Note (Signed)
 Nephrology Consult   Requesting provider: Marolyn Haller Service requesting consult: Hospitalist Reason for consult: AKI   Assessment/Recommendations: Cassie Mata is a/an 58 y.o. female with a past medical history DM2, HFrEF, HTN, CKD3a who present w/ nausea and vomiting c/b AKI   AKI on CKD 3a vs progressive CKD: likely has had some progression kidney disease over the past couple years but also some AKI component related to possible low blood pressures at home, dehydration.  Likely progress to some tubular injury.  May take time to resolve. -continue supportive care with IV fluids -Once creatinine is stable or improving she can discharge and follow-up with nephrology in the outpatient setting -Continue to monitor daily Cr, Dose meds for GFR -Monitor Daily I/Os, Daily weight  -Maintain MAP>65 for optimal renal perfusion.  -Avoid nephrotoxic medications including NSAIDs -Use synthetic opioids (Fentanyl/Dilaudid) if needed -Currently no indication for HD  Nausea vomiting: Unclear cause.  Management per primary team  LFT elevation: Improving possibly some shock liver that was mild.  Could have been related to a viral illness causing her nausea and vomiting as well.  Management per primary team  Uncontrolled diabetes with hyperglycemia: Management per primary team  Hyponatremia: Sodium 147 today.  Encourage oral hydration as tolerated.  Consider switching IV fluids to more diluted fluid  Metabolic acidosis: Mild at 19.  Continue to monitor  Hypertension: hold home diuretics and ARB  Recommendations conveyed to primary service.    [facility] 11/18/2023 12:48 PM   _____________________________________________________________________________________ CC: nausea and vomiting  History of Present Illness: Cassie Mata is a/an 58 y.o. female with a past medical history of DM 2, HTN, HFrEF who presents with nausea and vomiting.  Patient present  to the hospital 2 days agoBecause she has been having 3-4 days of nausea and vomiting.  She has not had any diarrhea.  She also notes significant decreased oral intake her at that time with inability to tolerate fluids significantly.  She did not have any dizziness, lightheadedness, shortness of breath, chest pain, dysuria or hematuria.  No NSAID use.  Did note that her urine output is decreased.  She has a history of poorly controlled diabetes.  She has not been following with doctors for the last couple years.  She saw Dr. Lequita Halt in 2023 who felt she had diabetic kidney disease.  She did have significant proteinuria with UACR of 2131.  She did not follow-up.here her creatinine has been elevated up to 2.9 today.  Urinalysis significant for some WBCs but no significant hematuria.  UPC 4.8.  Renal ultrasound consistent with chronic kidney disease.  She has been undergoing hydration with no improvement in kidney function.  She is on losartan, spironolactone, and Lasix in the outpatient setting but unclear if she was taking these regularly.   Medications:  Current Facility-Administered Medications  Medication Dose Route Frequency Provider Last Rate Last Admin   0.9 %  sodium chloride infusion   Intravenous Continuous Danford, Earl Lites, MD 125 mL/hr at 11/18/23 0700 New Bag at 11/18/23 0700   acetaminophen (TYLENOL) tablet 650 mg  650 mg Oral Q6H PRN Howerter, Justin B, DO       Or   acetaminophen (TYLENOL) suppository 650 mg  650 mg Rectal Q6H PRN Howerter, Justin B, DO       albuterol (PROVENTIL) (2.5 MG/3ML) 0.083% nebulizer solution 2.5 mg  2.5 mg Nebulization Q4H PRN Howerter, Justin B, DO       aspirin EC tablet  81 mg  81 mg Oral Daily Howerter, Justin B, DO   81 mg at 11/16/23 1352   fentaNYL (SUBLIMAZE) injection 25 mcg  25 mcg Intravenous Q2H PRN Howerter, Justin B, DO       fluticasone (FLONASE) 50 MCG/ACT nasal spray 2 spray  2 spray Each Nare Daily Howerter, Justin B, DO        hydrALAZINE (APRESOLINE) injection 10 mg  10 mg Intravenous Q4H PRN Howerter, Justin B, DO   10 mg at 11/18/23 1231   insulin aspart (novoLOG) injection 0-9 Units  0-9 Units Subcutaneous TID WC Howerter, Justin B, DO   1 Units at 11/18/23 0911   labetalol (NORMODYNE) tablet 200 mg  200 mg Oral TID Alberteen Sam, MD   200 mg at 11/17/23 2343   LORazepam (ATIVAN) injection 0.5 mg  0.5 mg Intravenous Q6H PRN Howerter, Justin B, DO   0.5 mg at 11/16/23 0159   melatonin tablet 3 mg  3 mg Oral QHS PRN Howerter, Justin B, DO       menthol-cetylpyridinium (CEPACOL) lozenge 3 mg  1 lozenge Oral PRN Howerter, Justin B, DO       mometasone-formoterol (DULERA) 200-5 MCG/ACT inhaler 2 puff  2 puff Inhalation BID Howerter, Justin B, DO   2 puff at 11/16/23 2013   naloxone [facility]) injection 0.4 mg  0.4 mg Intravenous PRN Howerter, Justin B, DO       ondansetron (ZOFRAN) injection 4 mg  4 mg Intravenous Q6H PRN Howerter, Justin B, DO   4 mg at 11/18/23 0912   polyethylene glycol (MIRALAX / GLYCOLAX) packet 17 g  17 g Oral Daily Danford, Earl Lites, MD   17 g at 11/16/23 1642   prochlorperazine (COMPAZINE) injection 10 mg  10 mg Intravenous Once Marolyn Haller, MD       senna-docusate (Senokot-S) tablet 2 tablet  2 tablet Oral BID Alberteen Sam, MD   2 tablet at 11/16/23 1620     ALLERGIES Eszopiclone, Gabapentin, Ivp dye [iodinated contrast media], Other, Prednisone, and Ultracet [tramadol-acetaminophen]  MEDICAL HISTORY Past Medical History:  Diagnosis Date   Arthritis    Asthma    Cataract    NS OU   CHF (congestive heart failure) (HCC)    Coma (HCC)    Depression    Diabetes mellitus    Diabetic retinopathy (HCC)    OU   Essential hypertension    Fibromyalgia    Gall stone    Gastroesophageal reflux disease    Hiatal hernia    Insomnia 08/07/2014   Myalgia and myositis, unspecified 01/13/2013   Stroke (HCC) 2007   Pt. evaluated at [facility] had  left sided weakness.  Reportedly no cerebral imaging ever done.  Records we have received from [facility] dated 2011 do not document this.  Rest of records in storage and have not yet sent.     SOCIAL HISTORY Social History   Socioeconomic History   Marital status: Married    Spouse name: Not on file   Number of children: 4   Years of education: 11   Highest education level: Not on file  Occupational History   Occupation: CNA    Employer: [employer]  Tobacco Use   Smoking status: Former    Current packs/day: 0.00    Average packs/day: 0.5 packs/day for 17.0 years (8.5 ttl pk-yrs)    Types: Cigarettes    Start date: 08/02/1978    Quit date: 08/03/1995  Years since quitting: 28.3   Smokeless tobacco: Never  Vaping Use   Vaping status: Never Used  Substance and Sexual Activity   Alcohol use: No   Drug use: No   Sexual activity: Not on file  Other Topics Concern   Not on file  Social History Narrative   Patient lives at home with her 2 children.    Patient has 4 children.    Patient has currently working part time.    Patient has 11th grade education.    Patient has a common law marriage   Poor diet, often eating only once daily.     Lot of junk food.   Social Drivers of Health   Financial Resource Strain: Medium Risk (01/24/2021)   Received from [facility] visits prior to 11/08/2022., [facility] visits prior to 11/08/2022.   Overall Financial Resource Strain (CARDIA)    Difficulty of Paying Living Expenses: Somewhat hard  Food Insecurity: No Food Insecurity (11/16/2023)   Hunger Vital Sign    Worried About Running Out of Food in the Last Year: Never true    Ran Out of Food in the Last Year: Never true  Transportation Needs: No Transportation Needs (11/16/2023)   PRAPARE - Administrator, Civil Service (Medical): No    Lack of Transportation (Non-Medical): No  Physical Activity: Sufficiently Active  (01/24/2021)   Received from [facility] visits prior to 11/08/2022., [facility] visits prior to 11/08/2022.   Exercise Vital Sign    Days of Exercise per Week: 5 days    Minutes of Exercise per Session: 30 min  Stress: Stress Concern Present (01/24/2021)   [facility] visits prior to 11/08/2022., [facility] visits prior to 11/08/2022.   [employer] of Occupational Health - Occupational Stress Questionnaire    Feeling of Stress : To some extent  Social Connections: Moderately Integrated (01/24/2021)   Received from [facility] visits prior to 11/08/2022., [facility] visits prior to 11/08/2022.   Social Advertising account executive [NHANES]    Frequency of Communication with Friends and Family: More than three times a week    Frequency of Social Gatherings with Friends and Family: More than three times a week    Attends Religious Services: More than 4 times per year    Active Member of [employer] or Organizations: Yes    Attends Engineer, structural: More than 4 times per year    Marital Status: Never married  Intimate Partner Violence: Not At Risk (11/16/2023)   Humiliation, Afraid, Rape, and Kick questionnaire    Fear of Current or Ex-Partner: No    Emotionally Abused: No    Physically Abused: No    Sexually Abused: No     FAMILY HISTORY Family History  Problem Relation Age of Onset   Cirrhosis Mother        alcoholic   Diabetes Father    Cancer Father        Brain   Diabetes Sister    Alcohol abuse Sister    Cirrhosis Sister    Arthritis Sister    Cancer Sister        ?possible gastric cancer   Mental illness Son        suicidal thoughts   ADD / ADHD Son    ADD / ADHD Son  ADD / ADHD Son    Mental illness Son        schizophrenia   Blindness Son        Legal blindness      Review of Systems: 12 systems  reviewed Otherwise as per HPI, all other systems reviewed and negative  Physical Exam: Vitals:   11/18/23 0705 11/18/23 1205  BP: (!) 168/91 (!) 176/106  Pulse:  87  Resp:  20  Temp:  99.3 F (37.4 C)  SpO2:  100%   No intake/output data recorded.  Intake/Output Summary (Last 24 hours) at 11/18/2023 1248 Last data filed at 11/18/2023 0600 Gross per 24 hour  Intake 3057.1 ml  Output 450 ml  Net 2607.1 ml   General: well-appearing, no acute distress HEENT: anicteric sclera, oropharynx clear without lesions CV: normal rate, no rub, no lower extremity edema Lungs: clear to auscultation bilaterally, normal work of breathing Abd: soft, non-tender, non-distended Skin: no visible lesions or rashes Psych: alert, engaged, appropriate mood and affect Musculoskeletal: no obvious deformities Neuro: normal speech, no gross focal deficits   Test Results Reviewed Lab Results  Component Value Date   NA 147 (H) 11/18/2023   K 4.7 11/18/2023   CL 116 (H) 11/18/2023   CO2 19 (L) 11/18/2023   BUN 50 (H) 11/18/2023   CREATININE 2.88 (H) 11/18/2023   CALCIUM 8.1 (L) 11/18/2023   ALBUMIN 2.3 (L) 11/18/2023   PHOS 4.0 04/02/2021    CBC Recent Labs  Lab 11/15/23 1925 11/16/23 0559 11/17/23 0505 11/18/23 0550  WBC 9.6 11.1* 11.1* 9.7  NEUTROABS 7.7 9.4*  --   --   HGB 12.5 12.9 11.8* 11.6*  HCT 40.3 41.5 38.6 39.9  MCV 91.4 91.0 93.5 97.3  PLT 300 312 288 250    I have reviewed all relevant outside healthcare records related to the patient's current hospitalization

## 2023-11-19 DIAGNOSIS — N179 Acute kidney failure, unspecified: Secondary | ICD-10-CM | POA: Diagnosis not present

## 2023-11-19 LAB — COMPREHENSIVE METABOLIC PANEL
ALT: 36 U/L (ref 0–44)
AST: 37 U/L (ref 15–41)
Albumin: 2.2 g/dL — ABNORMAL LOW (ref 3.5–5.0)
Alkaline Phosphatase: 25 U/L — ABNORMAL LOW (ref 38–126)
Anion gap: 7 (ref 5–15)
BUN: 50 mg/dL — ABNORMAL HIGH (ref 6–20)
CO2: 20 mmol/L — ABNORMAL LOW (ref 22–32)
Calcium: 8 mg/dL — ABNORMAL LOW (ref 8.9–10.3)
Chloride: 118 mmol/L — ABNORMAL HIGH (ref 98–111)
Creatinine, Ser: 2.81 mg/dL — ABNORMAL HIGH (ref 0.44–1.00)
GFR, Estimated: 19 mL/min — ABNORMAL LOW (ref >60)
Glucose, Bld: 102 mg/dL — ABNORMAL HIGH (ref 70–99)
Potassium: 4.6 mmol/L (ref 3.5–5.1)
Sodium: 145 mmol/L (ref 135–145)
Total Bilirubin: 0.9 mg/dL (ref 0.0–1.2)
Total Protein: 5.5 g/dL — ABNORMAL LOW (ref 6.5–8.1)

## 2023-11-19 LAB — FERRITIN: Ferritin: 96 ng/mL (ref 11–307)

## 2023-11-19 LAB — GLUCOSE, CAPILLARY
Glucose-Capillary: 91 mg/dL (ref 70–99)
Glucose-Capillary: 107 mg/dL — ABNORMAL HIGH (ref 70–99)

## 2023-11-19 LAB — IRON AND TIBC
Iron: 41 ug/dL (ref 28–170)
Saturation Ratios: 15 % (ref 10.4–31.8)
TIBC: 283 ug/dL (ref 250–450)
UIBC: 242 ug/dL

## 2023-11-19 LAB — CBC
HCT: 36.7 % (ref 36.0–46.0)
Hemoglobin: 10.9 g/dL — ABNORMAL LOW (ref 12.0–15.0)
MCH: 28.5 pg (ref 26.0–34.0)
MCHC: 29.7 g/dL — ABNORMAL LOW (ref 30.0–36.0)
MCV: 95.8 fL (ref 80.0–100.0)
Platelets: 207 10*3/uL (ref 150–400)
RBC: 3.83 MIL/uL — ABNORMAL LOW (ref 3.87–5.11)
RDW: 13.3 % (ref 11.5–15.5)
WBC: 7.8 10*3/uL (ref 4.0–10.5)
nRBC: 0 % (ref 0.0–0.2)

## 2023-11-19 LAB — PARATHYROID HORMONE, INTACT (NO CA): PTH: 54 pg/mL (ref 15–65)

## 2023-11-19 MED ORDER — CHLORHEXIDINE GLUCONATE CLOTH 2 % EX PADS
6.0000 | MEDICATED_PAD | Freq: Every day | CUTANEOUS | Status: DC
  Administered 2023-11-19 – 2023-11-21 (×3): 6 via TOPICAL

## 2023-11-19 MED ORDER — PANTOPRAZOLE SODIUM 40 MG IV SOLR
40.0000 mg | INTRAVENOUS | Status: DC
  Administered 2023-11-19 – 2023-11-20 (×2): 40 mg via INTRAVENOUS
  Filled 2023-11-19 (×2): qty 10

## 2023-11-19 MED ORDER — BOOST / RESOURCE BREEZE PO LIQD CUSTOM
1.0000 | Freq: Two times a day (BID) | ORAL | Status: DC
  Administered 2023-11-19 – 2023-11-21 (×5): 1 via ORAL

## 2023-11-19 NOTE — Progress Notes (Signed)
 Progress Note   Patient: Cassie Mata UUV:[id number] DOB: 14-May-1966 DOA: 11/15/2023     3 DOS: the patient was seen and examined on 11/19/2023 at 11:15 AM    Brief hospital course: 58 yo F with obesity, DM, HTN hx CVA and asthma who presented with few days constipation, vomiting and nausea and abdominal cramps.  In the ER, CT abdomen and pelvis unremarkable.  Noted incidentally to have AKI, Cr 2.38 mg/dL.  Admitted on fluids  Assessment and Plan: * AKI (acute kidney injury) on CKD IIIa Nephrotic range proteinuria Diabetic nephropathy Baseline Cr in care everywhere from 2023 is 1.2, P/C ratio >3 at that time.  None more recent.    Was on Lasix, losartan, spironolactone PTA, states her last dose was on the day prior to presenting to the hospital.   Cr 2.4 on admission now 2.8  and stable. She remains on IV fluids. No uremia or acidosis or hyperkalemia.  Very mild anasarca.  Renal US showed no hydronephrosis.   Urine protein/creat 4 g/g, UA with hyaline casts, no cells.  Patient likely with progression of her CKD with a component of acute kidney injury. Given her decreased PO intake for over a week and she continued to take her lasix and losartan.  - Nephrology consulted, and recommend no further interventions at this time.  - Continue IV fluids - Trend Cr - Strict I/Os   Transaminitis-Resolved  Unclear cause.  No hyperbilirubinemia.  No RUQ pain.   Treding down.  Acute hepatitis panel negative for hep A infection. +HepC antibody. RUQ [place] with no evidence of acute cholecystitis but noted to have cholelithiasis. Possibly related to congestive hepatopathy though patient appears hypovolemic. Also considered other viral infection.     Latest Ref Rng & Units 11/19/2023    4:54 AM 11/18/2023    5:50 AM 11/17/2023    2:56 PM  Hepatic Function  Total Protein 6.5 - 8.1 g/dL 5.5  6.1  5.8   Albumin 3.5 - 5.0 g/dL 2.2  2.3  2.2   AST 15 - 41 U/L 37  42  45   ALT 0 - 44 U/L 36  43  45   Alk  Phosphatase 38 - 126 U/L 25  28  29    Total Bilirubin 0.0 - 1.2 mg/dL 0.9  0.8  0.8   Bilirubin, Direct 0.0 - 0.2 mg/dL   0.3     Nausea & vomiting & constipation Epigastric abdominal pain Etiology remains unclear at this time. Patient has been able to hold down water and was able to eat soft ice cream.   She states symptoms started ~ 1 week prior to presenting to the hospital after eating at a restaurant. No diarrhea to support gastroenteritis.  No prior recurrent pattern to suggest gastroparesis.  Lipase normal.  CT A/P reassuring, rules out obstruction, pancreatitis, gallbladder disease, abscess.  BUN minimally elevated 46.  Was admitted in 2022 for constipation related vomiting, but has had a large BM here and no improvement in symptoms. It is possible that patient had virus leading to her symptoms that is slowly resolving.   Discussed patient's case with GI who recommended watchful waiting with no intervention at this time.  - Continue bisacodyl suppository - IV fluids - CLD -- Patient may require   Normocytic anemia     Latest Ref Rng & Units 11/19/2023    4:54 AM 11/18/2023    5:50 AM 11/17/2023    5:05 AM  CBC  WBC 4.0 -  10.5 K/uL 7.8  9.7  11.1   Hemoglobin 12.0 - 15.0 g/dL 16.1  09.6  04.5   Hematocrit 36.0 - 46.0 % 36.7  39.9  38.6   Platelets 150 - 400 K/uL 207  250  288    Iron/TIBC/Ferritin/ %Sat    Component Value Date/Time   IRON 41 11/19/2023 0454   TIBC 283 11/19/2023 0454   FERRITIN 96 11/19/2023 0454   IRONPCTSAT 15 11/19/2023 0454  Possibly mild iron deficiency. Can start iron supplementation on discharge.   Leukocytosis resolved   Chronic systolic CHF (congestive heart failure) (HCC) Echocardiogram this admission confirms EF 20 to 25%, grade 2 diastolic dysfunction.  Normal valves.  CT shows small pleural effusions bilaterally but no edema in lower lungs and clinically does not appear in exacerbation.  High BP and normal lactate. BNP was elevated  >4500 - Hold home furosemide, losartan, spironolactone due to AKI  Obesity (BMI 30-39.9) Class 1 BMI 32  Moderate persistent asthma No evidence of flare - Continue ICS/LABA  DM2 (diabetes mellitus, type 2) (HCC) A1c 6.5%, glucoses here normal - Hold home glargine given poor oral intake - Sliding scale corrections CBG (last 3)  Recent Labs    11/18/23 1624 11/18/23 2100 11/19/23 0758  GLUCAP 123* 118* 91    Cerebrovascular disease - Continue home aspirin  Essential hypertension  Patient able to take labetalol.  -Continue labetalol with antinausea medications.         Subjective: Able to tolerate some soft foods with no emesis.     Physical Exam: BP (!) 163/89 (BP Location: Left Arm)   Pulse 85   Temp 98.3 F (36.8 C)   Resp 18   Ht 5' (1.524 m)   Wt 84.7 kg   LMP 09/06/2015 (Exact Date)   SpO2 97%   BMI 36.47 kg/m     Physical Exam  Constitutional: In no distress.  Cardiovascular: Normal rate, regular rhythm. No lower extremity edema  Pulmonary: Non labored breathing on room air, no wheezing or rales.   Abdominal: Soft. Normal bowel sounds. Non distended and non tender Musculoskeletal: Normal range of motion.     Neurological: Alert and oriented to person, place, and time. Non focal  Skin: Skin is warm and dry.    Data Reviewed:    Latest Ref Rng & Units 11/19/2023    4:54 AM 11/18/2023   12:33 PM 11/18/2023    5:50 AM  BMP  Glucose 70 - 99 mg/dL 409  811  914   BUN 6 - 20 mg/dL 50  51  50   Creatinine 0.44 - 1.00 mg/dL 7.82  9.56  2.13   Sodium 135 - 145 mmol/L 145  145  147   Potassium 3.5 - 5.1 mmol/L 4.6  4.4  4.7   Chloride 98 - 111 mmol/L 118  118  116   CO2 22 - 32 mmol/L 20  20  19    Calcium 8.9 - 10.3 mg/dL 8.0  8.1  8.1       Latest Ref Rng & Units 11/19/2023    4:54 AM 11/18/2023    5:50 AM 11/17/2023    5:05 AM  CBC  WBC 4.0 - 10.5 K/uL 7.8  9.7  11.1   Hemoglobin 12.0 - 15.0 g/dL 08.6  57.8  46.9   Hematocrit 36.0 - 46.0 %  36.7  39.9  38.6   Platelets 150 - 400 K/uL 207  250  288  Family Communication: Children at the bedside    Disposition: Status is: Inpatient Further management of Renal failure, etiology remains unclear. Continues to have poor oral intake as well.       Author: Marolyn Haller, MD 11/19/2023 5:30 PM  For on call review [email or web address].

## 2023-11-19 NOTE — Progress Notes (Signed)
 Nephrology Follow-Up Consult note   Assessment/Recommendations: Cassie Mata is a/an 58 y.o. female with a past medical history significant for DM2, HFrEF, HTN, CKD3a who present w/ nausea and vomiting c/b AKI    AKI on CKD 3a vs progressive CKD: likely has had some progression kidney disease over the past couple years but also some AKI component related to possible low blood pressures at home, dehydration.  Likely progressed to some tubular injury.  May take time to resolve. -Given creatinine is stable at this time she does not have to remain in the hospital per nephrology -She would like to follow-up with Dr. Lequita Halt and suggest she schedule an appt -Continue to monitor daily Cr, Dose meds for GFR -Monitor Daily I/Os, Daily weight  -Maintain MAP>65 for optimal renal perfusion.  -Avoid nephrotoxic medications including NSAIDs -Use synthetic opioids (Fentanyl/Dilaudid) if needed -Currently no indication for HD  We will sign off at this time.   Nausea vomiting: Unclear cause.  Management per primary team.  Seems overall improved   LFT elevation: Improving possibly some shock liver that was mild.  Could have been related to a viral illness causing her nausea and vomiting as well.  Management per primary team.  Overall improved   Uncontrolled diabetes with hyperglycemia: Management per primary team   Hypernatremia: Improved today   Metabolic acidosis: Mild at 20.  Continue to monitor   Hypertension: hold home diuretics and ARB     Recommendations conveyed to primary service.    Darnell Level [facility] 11/19/2023 9:18 AM  ___________________________________________________________  CC: Nausea and vomiting  Interval History/Subjective: Patient has some nausea today but otherwise denies any complaints.   Medications:  Current Facility-Administered Medications  Medication Dose Route Frequency Provider Last Rate Last Admin   0.9 %  sodium chloride  infusion   Intravenous Continuous Alberteen Sam, MD 125 mL/hr at 11/19/23 0823 New Bag at 11/19/23 0823   acetaminophen (TYLENOL) tablet 650 mg  650 mg Oral Q6H PRN Howerter, Justin B, DO       Or   acetaminophen (TYLENOL) suppository 650 mg  650 mg Rectal Q6H PRN Howerter, Justin B, DO       albuterol (PROVENTIL) (2.5 MG/3ML) 0.083% nebulizer solution 2.5 mg  2.5 mg Nebulization Q4H PRN Howerter, Justin B, DO       aspirin EC tablet 81 mg  81 mg Oral Daily Howerter, Justin B, DO   81 mg at 11/16/23 1352   bisacodyl (DULCOLAX) suppository 10 mg  10 mg Rectal Daily Marolyn Haller, MD       fluticasone [facility]) 50 MCG/ACT nasal spray 2 spray  2 spray Each Nare Daily Howerter, Justin B, DO       hydrALAZINE (APRESOLINE) injection 10 mg  10 mg Intravenous Q4H PRN Howerter, Justin B, DO   10 mg at 11/18/23 1231   HYDROmorphone (DILAUDID) injection 0.5 mg  0.5 mg Intravenous Q4H PRN Marolyn Haller, MD       insulin aspart (novoLOG) injection 0-9 Units  0-9 Units Subcutaneous TID WC Howerter, Justin B, DO   1 Units at 11/18/23 1646   labetalol (NORMODYNE) tablet 200 mg  200 mg Oral TID Alberteen Sam, MD   200 mg at 11/18/23 2123   LORazepam (ATIVAN) injection 0.5 mg  0.5 mg Intravenous Q6H PRN Marolyn Haller, MD       melatonin tablet 3 mg  3 mg Oral QHS PRN Howerter, Justin B, DO  menthol-cetylpyridinium (CEPACOL) lozenge 3 mg  1 lozenge Oral PRN Howerter, Justin B, DO       mometasone-formoterol (DULERA) 200-5 MCG/ACT inhaler 2 puff  2 puff Inhalation BID Howerter, Justin B, DO   2 puff at 11/16/23 2013   naloxone [facility]) injection 0.4 mg  0.4 mg Intravenous PRN Howerter, Justin B, DO       ondansetron (ZOFRAN) injection 4 mg  4 mg Intravenous Q6H PRN Howerter, Justin B, DO   4 mg at 11/18/23 2351   prochlorperazine (COMPAZINE) injection 5 mg  5 mg Intravenous Q4H PRN Marolyn Haller, MD          Review of Systems: 10 systems reviewed and negative except per  interval history/subjective  Physical Exam: Vitals:   11/19/23 0127 11/19/23 0512  BP: (!) 156/82 (!) 163/89  Pulse: 82 85  Resp: 16 18  Temp: 98.4 F (36.9 C) 98.3 F (36.8 C)  SpO2: 94% 97%   No intake/output data recorded.  Intake/Output Summary (Last 24 hours) at 11/19/2023 1610 Last data filed at 11/19/2023 9604 Gross per 24 hour  Intake 1318.35 ml  Output 570 ml  Net 748.35 ml   Constitutional: well-appearing, no acute distress ENMT: ears and nose without scars or lesions, MMM CV: normal rate, no edema Respiratory: clear to auscultation, normal work of breathing Gastrointestinal: soft, non-tender, no palpable masses or hernias Skin: no visible lesions or rashes Psych: alert, judgement/insight appropriate, appropriate mood and affect   Test Results I personally reviewed new and old clinical labs and radiology tests Lab Results  Component Value Date   NA 145 11/19/2023   K 4.6 11/19/2023   CL 118 (H) 11/19/2023   CO2 20 (L) 11/19/2023   BUN 50 (H) 11/19/2023   CREATININE 2.81 (H) 11/19/2023   CALCIUM 8.0 (L) 11/19/2023   ALBUMIN 2.2 (L) 11/19/2023   PHOS 4.0 04/02/2021    CBC Recent Labs  Lab 11/15/23 1925 11/16/23 0559 11/17/23 0505 11/18/23 0550 11/19/23 0454  WBC 9.6 11.1* 11.1* 9.7 7.8  NEUTROABS 7.7 9.4*  --   --   --   HGB 12.5 12.9 11.8* 11.6* 10.9*  HCT 40.3 41.5 38.6 39.9 36.7  MCV 91.4 91.0 93.5 97.3 95.8  PLT 300 312 288 250 207

## 2023-11-19 NOTE — Progress Notes (Signed)
 Initial Nutrition Assessment  DOCUMENTATION CODES:   Obesity unspecified  INTERVENTION:  - Clear Liquid diet per MD.   - Advance as tolerated.  - Boost Breeze po BID, each supplement provides 250 kcal and 9 grams of protein - Encourage intake as tolerated.  - Multivitamin with minerals daily - Monitor weight trends.  - Given ongoing intolerance to oral intake for ~1 week, would recommend considersation of post-pyloric Cortrak if poor PO continues.   NUTRITION DIAGNOSIS:   Inadequate oral intake related to acute illness, nausea, vomiting as evidenced by energy intake < or equal to 50% for > or equal to 5 days.  GOAL:   Patient will meet greater than or equal to 90% of their needs  MONITOR:   PO intake, Diet advancement, Supplement acceptance, Labs, Weight trends  REASON FOR ASSESSMENT:   Consult Assessment of nutrition requirement/status, Poor PO  ASSESSMENT:   58 yo F with obesity, DM, HF, HTN, hx CVA and asthma who presented with few days constipation, vomiting and nausea and abdominal cramps. Admitted for AKI on CKD3 and ongoing N/V.  Patient in bed at time of visit, endorses ongoing nausea.  UBW reported to be 155# and patient shares she often fluctuates but no major changes.   Typically eats 3 meals a day at home. Works at 4:30am so usually has oatmeal and occasionally an Ensure for breakfast, then eats at [employer] for lunch and dinner.   Eating normally until last week, when patient ate at [facility] and afterwards has been having ongoing nausea and vomiting.   Has not been able to keep anything other than water and 1 ice pop (lemon) a day since admission.  She has a good appetite and feeling hungry but limited by nausea.  She is hesitantly agreeable to try Boost Breeze to support intake on clear liquid diet.  Given ongoing intolerance to oral intake for ~1 week, would recommend considersation of post-pyloric Cortrak.     Medications reviewed and include:  Dulcolax  Labs reviewed:  Creatinine 2.81 HA1C 6.5 Blood Glucose 91-123 x24 hours   NUTRITION - FOCUSED PHYSICAL EXAM:  Flowsheet Row Most Recent Value  Orbital Region No depletion  Upper Arm Region No depletion  Thoracic and Lumbar Region Unable to assess  Buccal Region No depletion  Temple Region No depletion  Clavicle Bone Region No depletion  Clavicle and Acromion Bone Region No depletion  Scapular Bone Region Unable to assess  Dorsal Hand No depletion  Patellar Region No depletion  Anterior Thigh Region No depletion  Posterior Calf Region No depletion  Edema (RD Assessment) None  Hair Reviewed  Eyes Reviewed  Mouth Reviewed  Skin Reviewed  Nails Reviewed       Diet Order:   Diet Order             Diet clear liquid Room service appropriate? Yes; Fluid consistency: Thin  Diet effective now                   EDUCATION NEEDS:  Education needs have been addressed  Skin:  Skin Assessment: Reviewed RN Assessment  Last BM:  3/12  Height:  Ht Readings from Last 1 Encounters:  11/16/23 5' (1.524 m)   Weight:  Wt Readings from Last 1 Encounters:  11/19/23 84.7 kg    BMI:  Body mass index is 36.47 kg/m.  Estimated Nutritional Needs:  Kcal:  1850-2100 kcals Protein:  90-105 grams Fluid:  >/= 1.9L    Shelle Iron RD,  LDN Contact via Secure Chat.

## 2023-11-20 ENCOUNTER — Inpatient Hospital Stay (HOSPITAL_COMMUNITY)

## 2023-11-20 DIAGNOSIS — N179 Acute kidney failure, unspecified: Secondary | ICD-10-CM | POA: Diagnosis not present

## 2023-11-20 LAB — COMPREHENSIVE METABOLIC PANEL
ALT: 28 U/L (ref 0–44)
AST: 34 U/L (ref 15–41)
Albumin: 1.9 g/dL — ABNORMAL LOW (ref 3.5–5.0)
Alkaline Phosphatase: 24 U/L — ABNORMAL LOW (ref 38–126)
Anion gap: 6 (ref 5–15)
BUN: 45 mg/dL — ABNORMAL HIGH (ref 6–20)
CO2: 19 mmol/L — ABNORMAL LOW (ref 22–32)
Calcium: 7.6 mg/dL — ABNORMAL LOW (ref 8.9–10.3)
Chloride: 116 mmol/L — ABNORMAL HIGH (ref 98–111)
Creatinine, Ser: 2.64 mg/dL — ABNORMAL HIGH (ref 0.44–1.00)
GFR, Estimated: 20 mL/min — ABNORMAL LOW (ref >60)
Glucose, Bld: 119 mg/dL — ABNORMAL HIGH (ref 70–99)
Potassium: 3.9 mmol/L (ref 3.5–5.1)
Sodium: 141 mmol/L (ref 135–145)
Total Bilirubin: 0.8 mg/dL (ref 0.0–1.2)
Total Protein: 5.2 g/dL — ABNORMAL LOW (ref 6.5–8.1)

## 2023-11-20 LAB — CBC
HCT: 37.9 % (ref 36.0–46.0)
Hemoglobin: 10.9 g/dL — ABNORMAL LOW (ref 12.0–15.0)
MCH: 28 pg (ref 26.0–34.0)
MCHC: 28.8 g/dL — ABNORMAL LOW (ref 30.0–36.0)
MCV: 97.4 fL (ref 80.0–100.0)
Platelets: 194 10*3/uL (ref 150–400)
RBC: 3.89 MIL/uL (ref 3.87–5.11)
RDW: 13.4 % (ref 11.5–15.5)
WBC: 6.7 10*3/uL (ref 4.0–10.5)
nRBC: 0 % (ref 0.0–0.2)

## 2023-11-20 LAB — GLUCOSE, CAPILLARY
Glucose-Capillary: 114 mg/dL — ABNORMAL HIGH (ref 70–99)
Glucose-Capillary: 104 mg/dL — ABNORMAL HIGH (ref 70–99)
Glucose-Capillary: 150 mg/dL — ABNORMAL HIGH (ref 70–99)
Glucose-Capillary: 181 mg/dL — ABNORMAL HIGH (ref 70–99)
Glucose-Capillary: 110 mg/dL — ABNORMAL HIGH (ref 70–99)

## 2023-11-20 LAB — BRAIN NATRIURETIC PEPTIDE: B Natriuretic Peptide: 4010.8 pg/mL — ABNORMAL HIGH (ref 0.0–100.0)

## 2023-11-20 MED ORDER — ALUM & MAG HYDROXIDE-SIMETH 200-200-20 MG/5ML PO SUSP
30.0000 mL | Freq: Once | ORAL | Status: AC
  Administered 2023-11-20: 30 mL via ORAL
  Filled 2023-11-20: qty 30

## 2023-11-20 NOTE — Plan of Care (Signed)

## 2023-11-20 NOTE — Progress Notes (Signed)
 Progress Note   Patient: Cassie Mata:[id number] DOB: 21-Apr-1966 DOA: 11/15/2023     4 DOS: the patient was seen and examined on 11/20/2023 at 11:15 AM    Brief hospital course: 58 yo F with obesity, DM, HTN hx CVA and asthma who presented with few days constipation, vomiting and nausea and abdominal cramps.  In the ER, CT abdomen and pelvis unremarkable.  Noted incidentally to have AKI, Cr 2.38 mg/dL.  Admitted on fluids  Assessment and Plan: * AKI (acute kidney injury) on CKD IIIa Nephrotic range proteinuria Diabetic nephropathy Baseline Cr in care everywhere from 2023 is 1.2, P/C ratio >3 at that time.  None more recent.    Was on Lasix, losartan, spironolactone PTA, states her last dose was on the day prior to presenting to the hospital.   Cr 2.4 on admission now 2.8  and stable. She remains on IV fluids. No uremia or acidosis or hyperkalemia.  Very mild anasarca.  Renal US showed no hydronephrosis.   Urine protein/creat 4 g/g, UA with hyaline casts, no cells.  Patient likely with progression of her CKD with a component of acute kidney injury. Given her decreased PO intake for over a week and she continued to take her lasix and losartan per her report. Fortunately her serum creatinine is improving. Patient now with signs of hypervolemia.  - Nephrology consulted, and recommend no further interventions at this time.  --If continues to improve may reinitiate lasix  - Trend Cr - Strict I/Os  Nausea & vomiting (resolved) & constipation Epigastric abdominal pain Resolved  Etiology remains unclear at this time. Patient states nausea is improving and has had no vomiting for about 2 days now. She is able to keep down broth and ice cream. She states her symptoms primarily occur when she is hungry. We discussed drinking ensures. Patient is adamant that she would like to avoid cotrak placement at this time.   Regarding possible etiology:  No diarrhea to support gastroenteritis.  No  prior recurrent pattern to suggest gastroparesis.  Lipase normal.  CT A/P reassuring, with no findings to suggest obstruction, pancreatitis, gallbladder disease, abscess.  BUN minimally elevated 46.   Was admitted in 2022 for constipation related vomiting, but has had a large BM here and remains symptomatic though markedly improved from presentation.. It is possible that patient had virus leading to her symptoms that is slowly resolving as she did have elevated transaminases.   Discussed patient's case with GI who recommended watchful waiting with no intervention at this time.  - Continue bisacodyl suppository - CLD, ensures   Chronic systolic CHF (congestive heart failure) (HCC) Echocardiogram this admission confirms EF 20 to 25%, grade 2 diastolic dysfunction.  Normal valves. Unclear exact etiology of CM.   CT A/P on admission showed small pleural effusions bilaterally but no edema in lower lungs.  High BP and normal lactate. BNP was elevated >4500. Patient initially appeared hypovolemic but now findings concerning for hypervolemia. She notes some abdominal distention. She has no LE edema and no rales or O2 req. BNP remains elevated ~4500 - If renal function continues to improve would challenge patient with low dose diuretic. Continue to hold losartan and spironolactone in the setting of AKI.  --She will need follow up with HF on discharge, states she has not seen cardiologist in ~1 year.   Transaminitis-Resolved  Unclear cause.  No hyperbilirubinemia.  No RUQ pain.   Treding down.  Acute hepatitis panel negative for hep A infection. +HepC  antibody. RUQ [place] with no evidence of acute cholecystitis but noted to have cholelithiasis. Possibly related to congestive hepatopathy though patient appears hypovolemic. Also considered other viral infection.     Latest Ref Rng & Units 11/20/2023    4:32 AM 11/19/2023    4:54 AM 11/18/2023    5:50 AM  Hepatic Function  Total Protein 6.5 - 8.1 g/dL 5.2  5.5   6.1   Albumin 3.5 - 5.0 g/dL 1.9  2.2  2.3   AST 15 - 41 U/L 34  37  42   ALT 0 - 44 U/L 28  36  43   Alk Phosphatase 38 - 126 U/L 24  25  28    Total Bilirubin 0.0 - 1.2 mg/dL 0.8  0.9  0.8    Hep C Antibody positive. Awaiting hep C RNA   Normocytic anemia     Latest Ref Rng & Units 11/20/2023    4:32 AM 11/19/2023    4:54 AM 11/18/2023    5:50 AM  CBC  WBC 4.0 - 10.5 K/uL 6.7  7.8  9.7   Hemoglobin 12.0 - 15.0 g/dL 54.0  98.1  19.1   Hematocrit 36.0 - 46.0 % 37.9  36.7  39.9   Platelets 150 - 400 K/uL 194  207  250    Iron/TIBC/Ferritin/ %Sat    Component Value Date/Time   IRON 41 11/19/2023 0454   TIBC 283 11/19/2023 0454   FERRITIN 96 11/19/2023 0454   IRONPCTSAT 15 11/19/2023 0454  Possibly mild iron deficiency. Can start iron supplementation on discharge.   Leukocytosis resolved   Obesity (BMI 30-39.9) Class 1 BMI 32  Moderate persistent asthma No evidence of flare - Continue ICS/LABA  DM2 (diabetes mellitus, type 2) (HCC) A1c 6.5%, glucoses here mostly normal - Continue to hold home glargine given poor oral intake - Sliding scale corrections CBG (last 3)  Recent Labs    11/20/23 0808 11/20/23 1242 11/20/23 1640  GLUCAP 104* 150* 181*   Cerebrovascular disease - Continue home aspirin  Essential hypertension -Continue labetalol with antinausea medications until home medications can be resumed.          Subjective: States nausea continues to improve. Able to eat broth and drink juice with no emesis.     Physical Exam: BP (!) 164/88   Pulse 84   Temp 98.9 F (37.2 C) (Oral)   Resp 17   Ht 5' (1.524 m)   Wt 84.7 kg   LMP 09/06/2015 (Exact Date)   SpO2 97%   BMI 36.47 kg/m    Physical Exam  Constitutional: In no distress.  Cardiovascular: Normal rate, regular rhythm. No lower extremity edema  Pulmonary: Non labored breathing on room air, no wheezing or rales.  Abdominal: Soft. Normal bowel sounds. NT, mildly distended.   Musculoskeletal: Normal range of motion.     Neurological: Alert and oriented to person, place, and time. Non focal  Skin: Skin is warm and dry.   Data Reviewed:    Latest Ref Rng & Units 11/20/2023    4:32 AM 11/19/2023    4:54 AM 11/18/2023   12:33 PM  BMP  Glucose 70 - 99 mg/dL 478  295  621   BUN 6 - 20 mg/dL 45  50  51   Creatinine 0.44 - 1.00 mg/dL 3.08  6.57  8.46   Sodium 135 - 145 mmol/L 141  145  145   Potassium 3.5 - 5.1 mmol/L 3.9  4.6  4.4  Chloride 98 - 111 mmol/L 116  118  118   CO2 22 - 32 mmol/L 19  20  20    Calcium 8.9 - 10.3 mg/dL 7.6  8.0  8.1       Latest Ref Rng & Units 11/20/2023    4:32 AM 11/19/2023    4:54 AM 11/18/2023    5:50 AM  CBC  WBC 4.0 - 10.5 K/uL 6.7  7.8  9.7   Hemoglobin 12.0 - 15.0 g/dL 13.2  44.0  10.2   Hematocrit 36.0 - 46.0 % 37.9  36.7  39.9   Platelets 150 - 400 K/uL 194  207  250       Family Communication: Children at the bedside    Disposition: Status is: Inpatient Further management of Renal failure, etiology remains unclear. Continues to have poor oral intake as well.       Author: Marolyn Haller, MD 11/20/2023 6:04 PM  For on call review [email or web address].

## 2023-11-21 DIAGNOSIS — N179 Acute kidney failure, unspecified: Secondary | ICD-10-CM | POA: Diagnosis not present

## 2023-11-21 LAB — BASIC METABOLIC PANEL
Anion gap: 6 (ref 5–15)
BUN: 40 mg/dL — ABNORMAL HIGH (ref 6–20)
CO2: 18 mmol/L — ABNORMAL LOW (ref 22–32)
Calcium: 7.9 mg/dL — ABNORMAL LOW (ref 8.9–10.3)
Chloride: 117 mmol/L — ABNORMAL HIGH (ref 98–111)
Creatinine, Ser: 2.54 mg/dL — ABNORMAL HIGH (ref 0.44–1.00)
GFR, Estimated: 21 mL/min — ABNORMAL LOW (ref >60)
Glucose, Bld: 107 mg/dL — ABNORMAL HIGH (ref 70–99)
Potassium: 3.8 mmol/L (ref 3.5–5.1)
Sodium: 141 mmol/L (ref 135–145)

## 2023-11-21 LAB — CBC
HCT: 37 % (ref 36.0–46.0)
Hemoglobin: 11.3 g/dL — ABNORMAL LOW (ref 12.0–15.0)
MCH: 29 pg (ref 26.0–34.0)
MCHC: 30.5 g/dL (ref 30.0–36.0)
MCV: 94.9 fL (ref 80.0–100.0)
Platelets: 185 10*3/uL (ref 150–400)
RBC: 3.9 MIL/uL (ref 3.87–5.11)
RDW: 13.3 % (ref 11.5–15.5)
WBC: 6.3 10*3/uL (ref 4.0–10.5)
nRBC: 0 % (ref 0.0–0.2)

## 2023-11-21 LAB — HCV RNA QUANT
HCV Quantitative Log: 6.185 {Log_IU}/mL (ref >1.70)
HCV Quantitative: 1530000 [IU]/mL (ref >50)

## 2023-11-21 LAB — GLUCOSE, CAPILLARY: Glucose-Capillary: 102 mg/dL — ABNORMAL HIGH (ref 70–99)

## 2023-11-21 LAB — MAGNESIUM: Magnesium: 2.3 mg/dL (ref 1.7–2.4)

## 2023-11-21 MED ORDER — CARVEDILOL 12.5 MG PO TABS
12.5000 mg | ORAL_TABLET | Freq: Two times a day (BID) | ORAL | 0 refills | Status: AC
Start: 2023-11-21 — End: 2024-11-20

## 2023-11-21 MED ORDER — MOMETASONE FURO-FORMOTEROL FUM 200-5 MCG/ACT IN AERO: 2.0000 | INHALATION_SPRAY | Freq: Two times a day (BID) | RESPIRATORY_TRACT | 0 refills | Status: AC

## 2023-11-21 NOTE — Plan of Care (Signed)
 MD placed discharge orders this morning; pt and pt family had concerns about discharge; MD notified and aware.  Regular diet orders placed and foley discontinued this am.  Family and pt requested to see if pt can eat first and see if pt can tolerate before discharge. Pt tolerated regular diet meal.  Pt has not voided since removal of foley.  MD notified and per MD "if pt does not void by 1230, okay to place foley in and discharge with foley"  Pt unable to void by 1230. Before RN attempted to place foley, pt and pt family requested to see if they can try lasix.  Checked with MD and confirmed, lasix not recommended due to AKI on CKD.  Pt and pt family agree with plan to have foley placed and discharge with foley.  MD notified and aware of situation

## 2023-11-21 NOTE — Discharge Summary (Signed)
 Physician Discharge Summary  Cassie Mata CZY:[id number] DOB: May 24, 1966 DOA: 11/15/2023  PCP: Eather Colas, FNP  Admit date: 11/15/2023 Discharge date: 11/21/2023  Admitted From: Home Disposition: Home  Recommendations for Outpatient Follow-up:  Follow up with PCP in 1-2 weeks Follow-up with nephrology as scheduled  Home Health: None Equipment/Devices: None  Discharge Condition: Stable CODE STATUS: Full Diet recommendation: Low-salt low-fat diet  Brief/Interim Summary: 58 yo F with obesity, DM, HTN hx CVA and asthma who presented with few days constipation, vomiting and nausea and abdominal cramps. In the ER, CT abdomen and pelvis unremarkable. Noted incidentally to have AKI, Cr 2.38 mg/dL. Admitted on fluids.  Patient admitted as above with incidentally noted AKI in setting of intractable nausea vomiting and abdominal cramps with poor p.o. intake.  Patient's renal function continues to improve slowly, nephrology consulted to ensure no further intervention or imaging required during hospitalization, nephrology recommending further outpatient follow-up.  Patient did have notable urinary obstruction as well during intake, patient failed Foley catheter removal trial on the 15th, will replace Foley and outpatient follow-up outpatient for further evaluation treatment with PCP.  Patient complains of difficulty with Foley removal would recommend urology evaluation in the outpatient setting.  Nephrology did voice concerns that patient's renal function may be advancing and this may very well be her new baseline but recommend repeat labs in the next few weeks to follow-up on renal function.   Discharge Diagnoses:  Principal Problem:   AKI (acute kidney injury) on CKD IIIa Active Problems:   Nausea & vomiting & constipation   Transaminitis   Chronic systolic CHF (congestive heart failure) (HCC)   Essential hypertension   Cerebrovascular disease   DM2 (diabetes mellitus, type 2) (HCC)    Moderate persistent asthma   Obesity (BMI 30-39.9)  AKI (acute kidney injury) on CKD IIIa Nephrotic range proteinuria Diabetic nephropathy Prior creatinine baseline 1.2 more than 2 years ago Renal follow-up in the outpatient setting, hold Lasix losartan and spironolactone for at least 1 week Renal US showed no hydronephrosis.  Urine protein/creat 4 g/g, UA with hyaline casts, no cells. Patient likely with progression of her CKD with a component of acute kidney injury.   Nausea & vomiting (resolved) & constipation Epigastric abdominal pain Resolved  Unclear etiology, likely gastroenteritis of some etiology Now resolved, advancing diet as appropriate   Chronic systolic CHF (congestive heart failure) (HCC) Echocardiogram this admission confirms EF 20 to 25%, grade 2 diastolic dysfunction.  Normal valves.  Transaminitis-Resolved  Hep C Acute hepatitis panel negative for hep A infection. +HepC antibody.  RUQ [place] with no evidence of acute cholecystitis but noted to have cholelithiasis.    Normocytic anemia Mild iron deficiency.  Discussed improved dietary iron intake Leukocytosis, reactive resolved  Obesity (BMI 30-39.9) Class 1 BMI 32 Moderate persistent asthma Continue ICS/LABA DM2 (diabetes mellitus, type 2) (HCC), well-controlled A1c 6.5%, glucoses here mostly normal Cerebrovascular disease- Continue home aspirin Essential hypertension -Continue labetalol with antinausea medications until home medications can be resumed.  Discharge Instructions   Allergies as of 11/21/2023       Reactions   Eszopiclone    Other reaction(s): Other (See Comments) Generic for lunesta-caused stroke like symptoms   Gabapentin Other (See Comments)   Other reaction(s): GI Upset (intolerance)   Ivp Dye [iodinated Contrast Media] Nausea Only   Other    "allergy medications"= makes heart flutter   Prednisone Other (See Comments)   Elevates blood sugar   Ultracet [tramadol-acetaminophen] Nausea And  Vomiting  Medication List     STOP taking these medications    cyclobenzaprine 10 MG tablet Commonly known as: FLEXERIL   fluticasone-salmeterol 250-50 MCG/ACT Aepb Commonly known as: ADVAIR Replaced by: mometasone-formoterol 200-5 MCG/ACT Aero   furosemide 40 MG tablet Commonly known as: LASIX   Insulin Syringes (Disposable) U-100 0.3 ML Misc   losartan 25 MG tablet Commonly known as: COZAAR   spironolactone 25 MG tablet Commonly known as: ALDACTONE       TAKE these medications    AgaMatrix Presto Test test strip Generic drug: glucose blood Check sugars twice daily before meals   aspirin 81 MG tablet Take 81 mg by mouth daily.   carvedilol 12.5 MG tablet Commonly known as: Coreg Take 1 tablet (12.5 mg total) by mouth 2 (two) times daily.   cholecalciferol 1000 units tablet Commonly known as: VITAMIN D Take 2,000 Units by mouth at bedtime.   diphenhydrAMINE 25 mg capsule Commonly known as: BENADRYL Take 25 mg by mouth every 6 (six) hours as needed for allergies.   fluticasone 50 MCG/ACT nasal spray Commonly known as: FLONASE Place 2 sprays into both nostrils daily.   mometasone-formoterol 200-5 MCG/ACT Aero Commonly known as: DULERA Inhale 2 puffs into the lungs 2 (two) times daily. Replaces: fluticasone-salmeterol 250-50 MCG/ACT Aepb   multivitamin tablet Take 1 tablet by mouth daily.   TechLite Pen Needles 32G X 4 MM Misc Generic drug: Insulin Pen Needle USE TO ADMINISTER INSULIN ONCE DAILY   Toujeo SoloStar 300 UNIT/ML Solostar Pen Generic drug: insulin glargine (1 Unit Dial) Inject 0-50 Units into the skin daily.        Allergies  Allergen Reactions   Eszopiclone     Other reaction(s): Other (See Comments) Generic for lunesta-caused stroke like symptoms    Gabapentin Other (See Comments)    Other reaction(s): GI Upset (intolerance)   Ivp Dye [Iodinated Contrast Media] Nausea Only   Other     "allergy medications"= makes  heart flutter   Prednisone Other (See Comments)    Elevates blood sugar   Ultracet [Tramadol-Acetaminophen] Nausea And Vomiting    Consultations: Nephrology  Procedures/Studies: [place] ASCITES (ABDOMEN LIMITED) Result Date: 11/21/2023 CLINICAL DATA:  Abdominal distension EXAM: LIMITED ABDOMEN ULTRASOUND FOR ASCITES TECHNIQUE: Limited ultrasound survey for ascites was performed in all four abdominal quadrants. COMPARISON:  11/17/2023 FINDINGS: Targeted sonography of the 4 quadrants is performed. Moderate right-sided pleural effusion is noted. No significant ascites within the abdomen or pelvis. IMPRESSION: No significant ascites. Moderate right pleural effusion. Electronically Signed   By: Jasmine Pang M.D.   On: 11/21/2023 00:07   US Abdomen Limited RUQ (LIVER/GB) Result Date: 11/17/2023 CLINICAL DATA:  Epigastric abdominal pain EXAM: ULTRASOUND ABDOMEN LIMITED RIGHT UPPER QUADRANT COMPARISON:  None Available. FINDINGS: Gallbladder: Multiple layering gallstones are seen within the gallbladder. The gallbladder, however, is not distended, there is no gallbladder wall thickening, and no pericholecystic fluid is identified. The sonographic Eulah Pont sign is reportedly negative. Common bile duct: Diameter: 3 mm in proximal diameter Liver: No focal lesion identified. Within normal limits in parenchymal echogenicity. Portal vein is patent on color Doppler imaging with normal direction of blood flow towards the liver. Other: Small right pleural effusion noted. IMPRESSION: 1. Cholelithiasis without sonographic evidence of acute cholecystitis. 2. Small right pleural effusion. Electronically Signed   By: Helyn Numbers M.D.   On: 11/17/2023 20:25   US RENAL Result Date: 11/16/2023 CLINICAL DATA:  Acute kidney injury. EXAM: RENAL / URINARY TRACT ULTRASOUND COMPLETE COMPARISON:  CT yesterday FINDINGS: Right Kidney: Renal measurements: 10.2 x 5 x 5.2 cm = volume: 138 mL. Increased renal parenchymal echogenicity. No  hydronephrosis. No evidence of focal lesion or stone. Left Kidney: Renal measurements: 9.5 x 5.6 x 6.1 cm = volume: 116 mL. Increased renal parenchymal echogenicity. No hydronephrosis. No evidence of focal lesion or stone. Bladder: Appears normal for degree of bladder distention. Other: Right pleural effusion. IMPRESSION: 1. No obstructive uropathy. 2. Increased renal parenchymal echotexture suggestive of chronic medical renal disease. Electronically Signed   By: Narda Rutherford M.D.   On: 11/16/2023 22:33   ECHOCARDIOGRAM COMPLETE Result Date: 11/16/2023    ECHOCARDIOGRAM REPORT   Patient Name:   AVONELLE VIVEROS Date of Exam: 11/16/2023 Medical Rec #:  [id number]       Height:       60.0 in Accession #:    [id number]      Weight:       164.0 lb Date of Birth:  1966/08/04       BSA:          1.716 m Patient Age:    57 years        BP:           157/84 mmHg Patient Gender: F               HR:           100 bpm. Exam Location:  Inpatient Procedure: 2D Echo, Cardiac Doppler and Color Doppler (Both Spectral and Color            Flow Doppler were utilized during procedure). Indications:    CHF Acute Systolic I50.21  History:        Patient has no prior history of Echocardiogram examinations.                 CHF; Risk Factors:Diabetes and Hypertension.  Sonographer:    Harriette Bouillon RDCS Referring Phys: [id number] JUSTIN B HOWERTER IMPRESSIONS  1. Left ventricular ejection fraction, by estimation, is 20 to 25%. The left ventricle has severely decreased function. The left ventricle demonstrates global hypokinesis. The left ventricular internal cavity size was mildly dilated. Left ventricular diastolic parameters are consistent with Grade II diastolic dysfunction (pseudonormalization).  2. Right ventricular systolic function is normal. The right ventricular size is normal. There is moderately elevated pulmonary artery systolic pressure.  3. Left atrial size was mildly dilated.  4. Right atrial size was mildly dilated.  5.  The mitral valve is normal in structure. Mild mitral valve regurgitation. No evidence of mitral stenosis.  6. Tricuspid valve regurgitation is mild to moderate.  7. The aortic valve is normal in structure. Aortic valve regurgitation is trivial. No aortic stenosis is present.  8. The inferior vena cava is dilated in size with <50% respiratory variability, suggesting right atrial pressure of 15 mmHg. FINDINGS  Left Ventricle: Left ventricular ejection fraction, by estimation, is 20 to 25%. The left ventricle has severely decreased function. The left ventricle demonstrates global hypokinesis. The left ventricular internal cavity size was mildly dilated. There is no left ventricular hypertrophy. Left ventricular diastolic parameters are consistent with Grade II diastolic dysfunction (pseudonormalization). Right Ventricle: The right ventricular size is normal. No increase in right ventricular wall thickness. Right ventricular systolic function is normal. There is moderately elevated pulmonary artery systolic pressure. The tricuspid regurgitant velocity is 3.33 m/s, and with an assumed right atrial pressure of 3 mmHg, the estimated right ventricular systolic pressure is 47.4 mmHg.  Left Atrium: Left atrial size was mildly dilated. Right Atrium: Right atrial size was mildly dilated. Pericardium: There is no evidence of pericardial effusion. Mitral Valve: The mitral valve is normal in structure. Mild mitral valve regurgitation. No evidence of mitral valve stenosis. Tricuspid Valve: The tricuspid valve is normal in structure. Tricuspid valve regurgitation is mild to moderate. No evidence of tricuspid stenosis. Aortic Valve: The aortic valve is normal in structure. Aortic valve regurgitation is trivial. No aortic stenosis is present. Pulmonic Valve: The pulmonic valve was normal in structure. Pulmonic valve regurgitation is trivial. No evidence of pulmonic stenosis. Aorta: The aortic root is normal in size and structure.  Venous: The inferior vena cava is dilated in size with less than 50% respiratory variability, suggesting right atrial pressure of 15 mmHg. IAS/Shunts: There is right bowing of the interatrial septum, suggestive of elevated left atrial pressure. No atrial level shunt detected by color flow Doppler.  LEFT VENTRICLE PLAX 2D LVIDd:         5.80 cm      Diastology LVIDs:         5.10 cm      LV e' medial:   5.44 cm/s LV PW:         1.00 cm      LV E/e' medial: 18.8 LV IVS:        1.00 cm LVOT diam:     2.10 cm LV SV:         45 LV SV Index:   26 LVOT Area:     3.46 cm  LV Volumes (MOD) LV vol d, MOD A2C: 128.0 ml LV vol d, MOD A4C: 141.0 ml LV vol s, MOD A2C: 98.6 ml LV vol s, MOD A4C: 89.5 ml LV SV MOD A2C:     29.4 ml LV SV MOD A4C:     141.0 ml LV SV MOD BP:      41.3 ml RIGHT VENTRICLE         IVC TAPSE (M-mode): 1.9 cm  IVC diam: 2.20 cm LEFT ATRIUM             Index        RIGHT ATRIUM           Index LA diam:        4.00 cm 2.33 cm/m   RA Area:     15.80 cm LA Vol (A2C):   75.6 ml 44.06 ml/m  RA Volume:   38.70 ml  22.56 ml/m LA Vol (A4C):   62.1 ml 36.19 ml/m LA Biplane Vol: 68.5 ml 39.92 ml/m  AORTIC VALVE LVOT Vmax:   72.10 cm/s LVOT Vmean:  47.500 cm/s LVOT VTI:    0.129 m  AORTA Ao Root diam: 3.10 cm Ao Asc diam:  3.00 cm MITRAL VALVE                TRICUSPID VALVE MV Area (PHT): 5.42 cm     TR Peak grad:   44.4 mmHg MV Decel Time: 140 msec     TR Vmax:        333.00 cm/s MV E velocity: 102.00 cm/s MV A velocity: 90.00 cm/s   SHUNTS MV E/A ratio:  1.13         Systemic VTI:  0.13 m                             Systemic Diam: 2.10 cm Arvilla Meres MD  Electronically signed by Arvilla Meres MD Signature Date/Time: 11/16/2023/3:34:11 PM    Final    CT ABDOMEN PELVIS WO CONTRAST Result Date: 11/15/2023 CLINICAL DATA:  Acute nonlocalized abdominal pain EXAM: CT ABDOMEN AND PELVIS WITHOUT CONTRAST TECHNIQUE: Multidetector CT imaging of the abdomen and pelvis was performed following the standard  protocol without IV contrast. RADIATION DOSE REDUCTION: This exam was performed according to the departmental dose-optimization program which includes automated exposure control, adjustment of the mA and/or kV according to patient size and/or use of iterative reconstruction technique. COMPARISON:  03/22/2021 FINDINGS: Lower chest: Small bilateral pleural effusions, right greater than left. Hypoattenuation of the cardiac blood pool is in keeping with at least mild anemia. Small hernia. No acute abnormality. Hepatobiliary: Cholelithiasis without superimposed pericholecystic inflammatory change. Liver unremarkable on this noncontrast examination. No intra or extrahepatic biliary ductal dilation. Pancreas: Unremarkable. No pancreatic ductal dilatation or surrounding inflammatory changes. Spleen: Unremarkable Adrenals/Urinary Tract: Adrenal glands are unremarkable. Kidneys are normal, without renal calculi, focal lesion, or hydronephrosis. Bladder is unremarkable. Stomach/Bowel: Moderate pancolonic diverticulosis. Stomach, small bowel, and large bowel are otherwise unremarkable. Appendix normal. No evidence of obstruction or focal inflammation. No free intraperitoneal gas or fluid. Vascular/Lymphatic: Aortic atherosclerosis. No enlarged abdominal or pelvic lymph nodes. Reproductive: Uterus and bilateral adnexa are unremarkable. Other: Diffuse subcutaneous body wall edema is present in keeping with anasarca Musculoskeletal: No acute or significant osseous findings. IMPRESSION: 1. No acute intra-abdominal pathology identified. No definite radiographic explanation for the patient's reported symptoms. 2. Small bilateral pleural effusions, right greater than left. Diffuse subcutaneous body wall edema in keeping with anasarca. 3. Cholelithiasis. 4. Moderate pancolonic diverticulosis without superimposed acute inflammatory change. Aortic Atherosclerosis (ICD10-I70.0). Electronically Signed   By: Helyn Numbers M.D.   On:  11/15/2023 23:32   DG Abdomen 1 View Result Date: 11/15/2023 CLINICAL DATA:  Abdominal pain and vomiting. EXAM: ABDOMEN - 1 VIEW COMPARISON:  CT 03/22/2021 FINDINGS: No bowel dilatation to suggest obstruction. Moderate stool in the colon. No abnormal rectal distention. No evidence of free air. Gallstones on prior CT are not seen by radiograph. There are multiple pelvic phleboliths. Vascular calcifications in the pelvis. Included lower lung bases are clear. No acute osseous findings. IMPRESSION: Normal bowel gas pattern. Moderate stool in the colon. Electronically Signed   By: Narda Rutherford M.D.   On: 11/15/2023 19:55     Subjective: No acute issues or events overnight denies nausea vomiting diarrhea constipation headache fevers chills or chest pain   Discharge Exam: Vitals:   11/20/23 2131 11/21/23 0433  BP: 137/85 (!) 148/86  Pulse:  76  Resp:  18  Temp:  97.8 F (36.6 C)  SpO2:  95%   Vitals:   11/19/23 2151 11/20/23 1941 11/20/23 2131 11/21/23 0433  BP: (!) 164/88 137/85 137/85 (!) 148/86  Pulse:  76  76  Resp:  20  18  Temp:  98.3 F (36.8 C)  97.8 F (36.6 C)  TempSrc:  Oral    SpO2:  98%  95%  Weight:    88.9 kg  Height:        General: Pt is alert, awake, not in acute distress Cardiovascular: RRR, S1/S2 +, no rubs, no gallops Respiratory: CTA bilaterally, no wheezing, no rhonchi Abdominal: Soft, NT, ND, bowel sounds + Extremities: no edema, no cyanosis    The results of significant diagnostics from this hospitalization (including imaging, microbiology, ancillary and laboratory) are listed below for reference.     Microbiology: Recent Results (from the past  240 hours)  Resp panel by RT-PCR (RSV, Flu A&B, Covid) Anterior Nasal Swab     Status: None   Collection Time: 11/15/23  6:55 PM   Specimen: Anterior Nasal Swab  Result Value Ref Range Status   SARS Coronavirus 2 by RT PCR NEGATIVE NEGATIVE Final   Influenza A by PCR NEGATIVE NEGATIVE Final   Influenza  B by PCR NEGATIVE NEGATIVE Final    Comment: (NOTE) The Xpert Xpress SARS-CoV-2/FLU/RSV plus assay is intended as an aid in the diagnosis of influenza from Nasopharyngeal swab specimens and should not be used as a sole basis for treatment. Nasal washings and aspirates are unacceptable for Xpert Xpress SARS-CoV-2/FLU/RSV testing.  Fact Sheet for Patients: BloggerCourse.com  Fact Sheet for Healthcare Providers: SeriousBroker.it  This test is not yet approved or cleared by the [place] FDA and has been authorized for detection and/or diagnosis of SARS-CoV-2 by FDA under an Emergency Use Authorization (EUA). This EUA will remain in effect (meaning this test can be used) for the duration of the COVID-19 declaration under Section 564(b)(1) of the Act, 21 U.S.C. section 360bbb-3(b)(1), unless the authorization is terminated or revoked.     Resp Syncytial Virus by PCR NEGATIVE NEGATIVE Final    Comment: (NOTE) Fact Sheet for Patients: BloggerCourse.com  Fact Sheet for Healthcare Providers: SeriousBroker.it  This test is not yet approved or cleared by the [place] FDA and has been authorized for detection and/or diagnosis of SARS-CoV-2 by FDA under an Emergency Use Authorization (EUA). This EUA will remain in effect (meaning this test can be used) for the duration of the COVID-19 declaration under Section 564(b)(1) of the Act, 21 U.S.C. section 360bbb-3(b)(1), unless the authorization is terminated or revoked.  Performed at [facility], [address] [place], [place] [address]   Culture, blood (Routine X 2) w Reflex to ID Panel     Status: None (Preliminary result)   Collection Time: 11/18/23  1:53 PM   Specimen: BLOOD RIGHT ARM  Result Value Ref Range Status   Specimen Description   Final    BLOOD RIGHT ARM Performed at [facility], [address]  [place], [place] [address]    Special Requests   Final    BOTTLES DRAWN AEROBIC ONLY Blood Culture results may not be optimal due to an inadequate volume of blood received in culture bottles Performed at [facility], [address] [place], [place] [address]    Culture   Final    NO GROWTH 3 DAYS Performed at [facility], [address], [place], [place] [address]    Report Status PENDING  Incomplete  Culture, blood (Routine X 2) w Reflex to ID Panel     Status: None (Preliminary result)   Collection Time: 11/18/23  1:57 PM   Specimen: BLOOD RIGHT HAND  Result Value Ref Range Status   Specimen Description   Final    BLOOD RIGHT HAND Performed at [facility], [address] [place], [place] [address]    Special Requests   Final    BOTTLES DRAWN AEROBIC ONLY Blood Culture results may not be optimal due to an inadequate volume of blood received in culture bottles Performed at [facility], [address] [place], [place] [address]    Culture   Final    NO GROWTH 3 DAYS Performed at [facility], [address] [place], [place] [address]    Report Status  PENDING  Incomplete     Labs: BNP (last 3 results) Recent Labs    11/16/23 0559 11/20/23 0432  BNP >4,500.0* 4,010.8*   Basic Metabolic Panel: Recent Labs  Lab 11/16/23 0117 11/16/23 0559 11/17/23 0505 11/18/23 0550 11/18/23 1233 11/19/23 0454 11/20/23 0432 11/21/23 0453  NA  --  142   < > 147* 145 145 141 141  K  --  4.8   < > 4.7 4.4 4.6 3.9 3.8  CL  --  109   < > 116* 118* 118* 116* 117*  CO2  --  23   < > 19* 20* 20* 19* 18*  GLUCOSE  --  162*   < > 120* 129* 102* 119* 107*  BUN  --  45*   < > 50* 51* 50* 45* 40*  CREATININE  --  2.47*   < > 2.88* 2.87* 2.81* 2.64* 2.54*  CALCIUM  --  8.3*   < > 8.1* 8.1* 8.0* 7.6* 7.9*  MG 2.4 2.5*  --   --   --   --   --  2.3   < > = values in this interval not displayed.   Liver Function Tests: Recent Labs  Lab  11/16/23 0559 11/17/23 1456 11/18/23 0550 11/19/23 0454 11/20/23 0432  AST 73* 45* 42* 37 34  ALT 70* 45* 43 36 28  ALKPHOS 41 29* 28* 25* 24*  BILITOT 1.3* 0.8 0.8 0.9 0.8  PROT 7.2 5.8* 6.1* 5.5* 5.2*  ALBUMIN 2.6* 2.2* 2.3* 2.2* 1.9*   Recent Labs  Lab 11/16/23 0559  LIPASE 28   No results for input(s): "AMMONIA" in the last 168 hours. CBC: Recent Labs  Lab 11/15/23 1925 11/16/23 0559 11/17/23 0505 11/18/23 0550 11/19/23 0454 11/20/23 0432 11/21/23 0453  WBC 9.6 11.1* 11.1* 9.7 7.8 6.7 6.3  NEUTROABS 7.7 9.4*  --   --   --   --   --   HGB 12.5 12.9 11.8* 11.6* 10.9* 10.9* 11.3*  HCT 40.3 41.5 38.6 39.9 36.7 37.9 37.0  MCV 91.4 91.0 93.5 97.3 95.8 97.4 94.9  PLT 300 312 288 250 207 194 185   Cardiac Enzymes: Recent Labs  Lab 11/16/23 0117  CKTOTAL 296*   BNP: Invalid input(s): "POCBNP" CBG: Recent Labs  Lab 11/20/23 0808 11/20/23 1242 11/20/23 1640 11/20/23 2033 11/21/23 0811  GLUCAP 104* 150* 181* 110* 102*   D-Dimer No results for input(s): "DDIMER" in the last 72 hours. Hgb A1c No results for input(s): "HGBA1C" in the last 72 hours. Lipid Profile No results for input(s): "CHOL", "HDL", "LDLCALC", "TRIG", "CHOLHDL", "LDLDIRECT" in the last 72 hours. Thyroid function studies No results for input(s): "TSH", "T4TOTAL", "T3FREE", "THYROIDAB" in the last 72 hours.  Invalid input(s): "FREET3" Anemia work up Recent Labs    11/19/23 0454  FERRITIN 96  TIBC 283  IRON 41   Urinalysis    Component Value Date/Time   COLORURINE AMBER (A) 11/18/2023 1616   APPEARANCEUR HAZY (A) 11/18/2023 1616   LABSPEC 1.020 11/18/2023 1616   PHURINE 5.0 11/18/2023 1616   GLUCOSEU NEGATIVE 11/18/2023 1616   HGBUR SMALL (A) 11/18/2023 1616   BILIRUBINUR NEGATIVE 11/18/2023 1616   KETONESUR NEGATIVE 11/18/2023 1616   PROTEINUR >=300 (A) 11/18/2023 1616   NITRITE NEGATIVE 11/18/2023 1616   LEUKOCYTESUR NEGATIVE 11/18/2023 1616   Sepsis Labs Recent Labs  Lab  11/18/23 0550 11/19/23 0454 11/20/23 0432 11/21/23 0453  WBC 9.7 7.8 6.7 6.3   Microbiology Recent Results (from the  past 240 hours)  Resp panel by RT-PCR (RSV, Flu A&B, Covid) Anterior Nasal Swab     Status: None   Collection Time: 11/15/23  6:55 PM   Specimen: Anterior Nasal Swab  Result Value Ref Range Status   SARS Coronavirus 2 by RT PCR NEGATIVE NEGATIVE Final   Influenza A by PCR NEGATIVE NEGATIVE Final   Influenza B by PCR NEGATIVE NEGATIVE Final    Comment: (NOTE) The Xpert Xpress SARS-CoV-2/FLU/RSV plus assay is intended as an aid in the diagnosis of influenza from Nasopharyngeal swab specimens and should not be used as a sole basis for treatment. Nasal washings and aspirates are unacceptable for Xpert Xpress SARS-CoV-2/FLU/RSV testing.  Fact Sheet for Patients: BloggerCourse.com  Fact Sheet for Healthcare Providers: SeriousBroker.it  This test is not yet approved or cleared by the [place] FDA and has been authorized for detection and/or diagnosis of SARS-CoV-2 by FDA under an Emergency Use Authorization (EUA). This EUA will remain in effect (meaning this test can be used) for the duration of the COVID-19 declaration under Section 564(b)(1) of the Act, 21 U.S.C. section 360bbb-3(b)(1), unless the authorization is terminated or revoked.     Resp Syncytial Virus by PCR NEGATIVE NEGATIVE Final    Comment: (NOTE) Fact Sheet for Patients: BloggerCourse.com  Fact Sheet for Healthcare Providers: SeriousBroker.it  This test is not yet approved or cleared by the [place] FDA and has been authorized for detection and/or diagnosis of SARS-CoV-2 by FDA under an Emergency Use Authorization (EUA). This EUA will remain in effect (meaning this test can be used) for the duration of the COVID-19 declaration under Section 564(b)(1) of the Act, 21 U.S.C. section  360bbb-3(b)(1), unless the authorization is terminated or revoked.  Performed at [facility], [address], [place], [place] [address]   Culture, blood (Routine X 2) w Reflex to ID Panel     Status: None (Preliminary result)   Collection Time: 11/18/23  1:53 PM   Specimen: BLOOD RIGHT ARM  Result Value Ref Range Status   Specimen Description   Final    BLOOD RIGHT ARM Performed at [facility], [address] [place], [place] [address]    Special Requests   Final    BOTTLES DRAWN AEROBIC ONLY Blood Culture results may not be optimal due to an inadequate volume of blood received in culture bottles Performed at [facility], [address] [place], [place] [address]    Culture   Final    NO GROWTH 3 DAYS Performed at [facility], [address], [place], [place] [address]    Report Status PENDING  Incomplete  Culture, blood (Routine X 2) w Reflex to ID Panel     Status: None (Preliminary result)   Collection Time: 11/18/23  1:57 PM   Specimen: BLOOD RIGHT HAND  Result Value Ref Range Status   Specimen Description   Final    BLOOD RIGHT HAND Performed at [facility], [address] [place], [place] [address]    Special Requests   Final    BOTTLES DRAWN AEROBIC ONLY Blood Culture results may not be optimal due to an inadequate volume of blood received in culture bottles Performed at [facility], [address] [place], [place] [address]    Culture   Final    NO GROWTH 3 DAYS Performed at [facility], [address] [place], [place] [address]    Report Status  PENDING  Incomplete     Time coordinating discharge: Over 30 minutes  SIGNED:   Azucena Fallen, DO [facility] 11/21/2023, 2:46 PM Pager   If 7PM-7AM, please contact night-coverage [email or web address]

## 2023-11-23 LAB — CULTURE, BLOOD (ROUTINE X 2)
Culture: NO GROWTH
Culture: NO GROWTH

## 2024-11-07 ENCOUNTER — Ambulatory Visit (HOSPITAL_COMMUNITY): Payer: Self-pay | Admitting: Psychiatry
